# Patient Record
Sex: Male | Born: 1940 | Race: Black or African American | Hispanic: No | Marital: Married | Smoking: Former smoker
Health system: Southern US, Community
[De-identification: ages and names within clinical notes are randomized; demographics above are authoritative.]

## PROBLEM LIST (undated history)

## (undated) DIAGNOSIS — I10 Essential (primary) hypertension: Secondary | ICD-10-CM

## (undated) DIAGNOSIS — I4510 Unspecified right bundle-branch block: Secondary | ICD-10-CM

## (undated) DIAGNOSIS — G4730 Sleep apnea, unspecified: Secondary | ICD-10-CM

## (undated) DIAGNOSIS — F419 Anxiety disorder, unspecified: Secondary | ICD-10-CM

## (undated) DIAGNOSIS — E785 Hyperlipidemia, unspecified: Secondary | ICD-10-CM

## (undated) DIAGNOSIS — M810 Age-related osteoporosis without current pathological fracture: Secondary | ICD-10-CM

## (undated) DIAGNOSIS — K219 Gastro-esophageal reflux disease without esophagitis: Secondary | ICD-10-CM

## (undated) DIAGNOSIS — F32A Depression, unspecified: Secondary | ICD-10-CM

## (undated) DIAGNOSIS — F329 Major depressive disorder, single episode, unspecified: Secondary | ICD-10-CM

## (undated) DIAGNOSIS — K635 Polyp of colon: Secondary | ICD-10-CM

## (undated) DIAGNOSIS — M1990 Unspecified osteoarthritis, unspecified site: Secondary | ICD-10-CM

## (undated) DIAGNOSIS — D649 Anemia, unspecified: Secondary | ICD-10-CM

## (undated) DIAGNOSIS — T7840XA Allergy, unspecified, initial encounter: Secondary | ICD-10-CM

## (undated) HISTORY — DX: Allergy, unspecified, initial encounter: T78.40XA

## (undated) HISTORY — DX: Gastro-esophageal reflux disease without esophagitis: K21.9

## (undated) HISTORY — DX: Age-related osteoporosis without current pathological fracture: M81.0

## (undated) HISTORY — PX: ELBOW SURGERY: SHX618

## (undated) HISTORY — DX: Hyperlipidemia, unspecified: E78.5

## (undated) HISTORY — DX: Anxiety disorder, unspecified: F41.9

## (undated) HISTORY — DX: Unspecified right bundle-branch block: I45.10

## (undated) HISTORY — DX: Anemia, unspecified: D64.9

## (undated) HISTORY — DX: Essential (primary) hypertension: I10

## (undated) HISTORY — DX: Polyp of colon: K63.5

---

## 1998-12-22 ENCOUNTER — Ambulatory Visit (HOSPITAL_COMMUNITY): Admission: RE | Admit: 1998-12-22 | Discharge: 1998-12-22 | Payer: Self-pay | Admitting: Gastroenterology

## 1998-12-22 ENCOUNTER — Encounter (INDEPENDENT_AMBULATORY_CARE_PROVIDER_SITE_OTHER): Payer: Self-pay

## 1999-09-29 ENCOUNTER — Encounter: Payer: Self-pay | Admitting: Gastroenterology

## 1999-09-29 ENCOUNTER — Ambulatory Visit (HOSPITAL_COMMUNITY): Admission: RE | Admit: 1999-09-29 | Discharge: 1999-09-29 | Payer: Self-pay | Admitting: Gastroenterology

## 2000-06-20 ENCOUNTER — Encounter (INDEPENDENT_AMBULATORY_CARE_PROVIDER_SITE_OTHER): Payer: Self-pay

## 2000-06-20 ENCOUNTER — Ambulatory Visit (HOSPITAL_COMMUNITY): Admission: RE | Admit: 2000-06-20 | Discharge: 2000-06-20 | Payer: Self-pay | Admitting: Gastroenterology

## 2004-11-25 ENCOUNTER — Ambulatory Visit: Payer: Self-pay | Admitting: Family Medicine

## 2005-12-08 ENCOUNTER — Ambulatory Visit: Payer: Self-pay | Admitting: Family Medicine

## 2005-12-08 LAB — CONVERTED CEMR LAB
ALT: 31 units/L (ref 0–40)
AST: 28 units/L (ref 0–37)
Albumin: 3.5 g/dL (ref 3.5–5.2)
Alkaline Phosphatase: 63 units/L (ref 39–117)
BUN: 8 mg/dL (ref 6–23)
Basophils Absolute: 0 10*3/uL (ref 0.0–0.1)
Basophils Relative: 0.6 % (ref 0.0–1.0)
CO2: 28 meq/L (ref 19–32)
Calcium: 10.8 mg/dL — ABNORMAL HIGH (ref 8.4–10.5)
Chloride: 106 meq/L (ref 96–112)
Chol/HDL Ratio, serum: 2.6
Cholesterol: 164 mg/dL (ref 0–200)
Creatinine, Ser: 1 mg/dL (ref 0.4–1.5)
Eosinophil percent: 2.6 % (ref 0.0–5.0)
GFR calc non Af Amer: 80 mL/min
Glomerular Filtration Rate, Af Am: 96 mL/min/{1.73_m2}
Glucose, Bld: 105 mg/dL — ABNORMAL HIGH (ref 70–99)
HCT: 33.9 % — ABNORMAL LOW (ref 39.0–52.0)
HDL: 64.1 mg/dL (ref >39.0)
Hemoglobin: 11.3 g/dL — ABNORMAL LOW (ref 13.0–17.0)
Hgb A1c MFr Bld: 6.2 % — ABNORMAL HIGH (ref 4.6–6.0)
LDL Cholesterol: 84 mg/dL (ref 0–99)
Lymphocytes Relative: 36.7 % (ref 12.0–46.0)
MCHC: 33.2 g/dL (ref 30.0–36.0)
MCV: 86.8 fL (ref 78.0–100.0)
Monocytes Absolute: 0.5 10*3/uL (ref 0.2–0.7)
Monocytes Relative: 7.7 % (ref 3.0–11.0)
Neutro Abs: 3.7 10*3/uL (ref 1.4–7.7)
Neutrophils Relative %: 52.4 % (ref 43.0–77.0)
PSA: 0.32 ng/mL (ref 0.10–4.00)
Platelets: 379 10*3/uL (ref 150–400)
Potassium: 4.4 meq/L (ref 3.5–5.1)
RBC: 3.91 M/uL — ABNORMAL LOW (ref 4.22–5.81)
RDW: 14.7 % — ABNORMAL HIGH (ref 11.5–14.6)
Sodium: 139 meq/L (ref 135–145)
TSH: 0.68 microintl units/mL (ref 0.35–5.50)
Total Bilirubin: 0.6 mg/dL (ref 0.3–1.2)
Total Protein: 6.8 g/dL (ref 6.0–8.3)
Triglyceride fasting, serum: 81 mg/dL (ref 0–149)
VLDL: 16 mg/dL (ref 0–40)
WBC: 7 10*3/uL (ref 4.5–10.5)

## 2005-12-15 ENCOUNTER — Ambulatory Visit: Payer: Self-pay | Admitting: Family Medicine

## 2006-10-11 ENCOUNTER — Ambulatory Visit: Payer: Self-pay | Admitting: Family Medicine

## 2006-10-11 DIAGNOSIS — L03019 Cellulitis of unspecified finger: Secondary | ICD-10-CM

## 2006-12-16 ENCOUNTER — Encounter: Payer: Self-pay | Admitting: Family Medicine

## 2006-12-16 ENCOUNTER — Ambulatory Visit: Payer: Self-pay | Admitting: Family Medicine

## 2006-12-16 DIAGNOSIS — E785 Hyperlipidemia, unspecified: Secondary | ICD-10-CM

## 2006-12-16 DIAGNOSIS — Z860100 Personal history of colon polyps, unspecified: Secondary | ICD-10-CM | POA: Insufficient documentation

## 2006-12-16 DIAGNOSIS — N401 Enlarged prostate with lower urinary tract symptoms: Secondary | ICD-10-CM

## 2006-12-16 DIAGNOSIS — J309 Allergic rhinitis, unspecified: Secondary | ICD-10-CM | POA: Insufficient documentation

## 2006-12-16 DIAGNOSIS — I10 Essential (primary) hypertension: Secondary | ICD-10-CM

## 2006-12-16 DIAGNOSIS — N138 Other obstructive and reflux uropathy: Secondary | ICD-10-CM

## 2006-12-16 DIAGNOSIS — F528 Other sexual dysfunction not due to a substance or known physiological condition: Secondary | ICD-10-CM

## 2006-12-16 DIAGNOSIS — F411 Generalized anxiety disorder: Secondary | ICD-10-CM | POA: Insufficient documentation

## 2006-12-16 DIAGNOSIS — J45909 Unspecified asthma, uncomplicated: Secondary | ICD-10-CM | POA: Insufficient documentation

## 2006-12-16 DIAGNOSIS — K219 Gastro-esophageal reflux disease without esophagitis: Secondary | ICD-10-CM

## 2006-12-19 ENCOUNTER — Telehealth: Payer: Self-pay | Admitting: Family Medicine

## 2006-12-19 LAB — CONVERTED CEMR LAB
ALT: 39 units/L (ref 0–53)
AST: 38 units/L — ABNORMAL HIGH (ref 0–37)
Albumin: 3.6 g/dL (ref 3.5–5.2)
Alkaline Phosphatase: 67 units/L (ref 39–117)
BUN: 9 mg/dL (ref 6–23)
Basophils Absolute: 0.1 10*3/uL (ref 0.0–0.1)
Basophils Relative: 1 % (ref 0.0–1.0)
Bilirubin, Direct: 0.1 mg/dL (ref 0.0–0.3)
CO2: 30 meq/L (ref 19–32)
Calcium: 10.9 mg/dL — ABNORMAL HIGH (ref 8.4–10.5)
Chloride: 108 meq/L (ref 96–112)
Cholesterol: 159 mg/dL (ref 0–200)
Creatinine, Ser: 0.9 mg/dL (ref 0.4–1.5)
Eosinophils Absolute: 0.2 10*3/uL (ref 0.0–0.6)
Eosinophils Relative: 2.8 % (ref 0.0–5.0)
GFR calc Af Amer: 109 mL/min
GFR calc non Af Amer: 90 mL/min
Glucose, Bld: 113 mg/dL — ABNORMAL HIGH (ref 70–99)
HCT: 32.8 % — ABNORMAL LOW (ref 39.0–52.0)
HDL: 61.2 mg/dL (ref >39.0)
Hemoglobin: 10.8 g/dL — ABNORMAL LOW (ref 13.0–17.0)
LDL Cholesterol: 80 mg/dL (ref 0–99)
Lymphocytes Relative: 35.9 % (ref 12.0–46.0)
MCHC: 33 g/dL (ref 30.0–36.0)
MCV: 80.8 fL (ref 78.0–100.0)
Monocytes Absolute: 0.5 10*3/uL (ref 0.2–0.7)
Monocytes Relative: 8.1 % (ref 3.0–11.0)
Neutro Abs: 3.2 10*3/uL (ref 1.4–7.7)
Neutrophils Relative %: 52.2 % (ref 43.0–77.0)
PSA: 0.5 ng/mL (ref 0.10–4.00)
Platelets: 379 10*3/uL (ref 150–400)
Potassium: 4.6 meq/L (ref 3.5–5.1)
RBC: 4.06 M/uL — ABNORMAL LOW (ref 4.22–5.81)
RDW: 16.2 % — ABNORMAL HIGH (ref 11.5–14.6)
Sodium: 142 meq/L (ref 135–145)
TSH: 0.5 microintl units/mL (ref 0.35–5.50)
Total Bilirubin: 0.6 mg/dL (ref 0.3–1.2)
Total CHOL/HDL Ratio: 2.6
Total Protein: 6.8 g/dL (ref 6.0–8.3)
Triglycerides: 91 mg/dL (ref 0–149)
VLDL: 18 mg/dL (ref 0–40)
WBC: 6.2 10*3/uL (ref 4.5–10.5)

## 2006-12-20 ENCOUNTER — Telehealth: Payer: Self-pay | Admitting: Family Medicine

## 2007-04-13 ENCOUNTER — Telehealth (INDEPENDENT_AMBULATORY_CARE_PROVIDER_SITE_OTHER): Payer: Self-pay | Admitting: *Deleted

## 2007-07-31 ENCOUNTER — Ambulatory Visit: Payer: Self-pay | Admitting: Family Medicine

## 2007-07-31 DIAGNOSIS — J019 Acute sinusitis, unspecified: Secondary | ICD-10-CM

## 2007-08-23 ENCOUNTER — Telehealth: Payer: Self-pay | Admitting: Family Medicine

## 2008-01-09 ENCOUNTER — Ambulatory Visit: Payer: Self-pay | Admitting: Family Medicine

## 2008-01-09 DIAGNOSIS — D509 Iron deficiency anemia, unspecified: Secondary | ICD-10-CM

## 2009-01-14 ENCOUNTER — Ambulatory Visit: Payer: Self-pay | Admitting: Family Medicine

## 2009-01-14 DIAGNOSIS — N410 Acute prostatitis: Secondary | ICD-10-CM | POA: Insufficient documentation

## 2009-02-24 ENCOUNTER — Encounter: Payer: Self-pay | Admitting: Family Medicine

## 2009-12-09 ENCOUNTER — Ambulatory Visit: Payer: Self-pay | Admitting: Family Medicine

## 2009-12-09 LAB — CONVERTED CEMR LAB
ALT: 28 units/L (ref 0–53)
AST: 29 units/L (ref 0–37)
Albumin: 3.6 g/dL (ref 3.5–5.2)
Alkaline Phosphatase: 57 units/L (ref 39–117)
BUN: 12 mg/dL (ref 6–23)
Basophils Absolute: 0 10*3/uL (ref 0.0–0.1)
Basophils Relative: 0.5 % (ref 0.0–3.0)
Bilirubin Urine: NEGATIVE
Bilirubin, Direct: 0.1 mg/dL (ref 0.0–0.3)
Blood in Urine, dipstick: NEGATIVE
CO2: 29 meq/L (ref 19–32)
Calcium: 11.1 mg/dL — ABNORMAL HIGH (ref 8.4–10.5)
Chloride: 106 meq/L (ref 96–112)
Cholesterol: 167 mg/dL (ref 0–200)
Creatinine, Ser: 1.2 mg/dL (ref 0.4–1.5)
Eosinophils Absolute: 0.2 10*3/uL (ref 0.0–0.7)
Eosinophils Relative: 2.8 % (ref 0.0–5.0)
GFR calc non Af Amer: 79.48 mL/min (ref >60)
Glucose, Bld: 104 mg/dL — ABNORMAL HIGH (ref 70–99)
Glucose, Urine, Semiquant: NEGATIVE
HCT: 39 % (ref 39.0–52.0)
HDL: 63.2 mg/dL (ref >39.00)
Hemoglobin: 13 g/dL (ref 13.0–17.0)
Ketones, urine, test strip: NEGATIVE
LDL Cholesterol: 82 mg/dL (ref 0–99)
Lymphocytes Relative: 33.7 % (ref 12.0–46.0)
Lymphs Abs: 2.3 10*3/uL (ref 0.7–4.0)
MCHC: 33.4 g/dL (ref 30.0–36.0)
MCV: 92.7 fL (ref 78.0–100.0)
Monocytes Absolute: 0.5 10*3/uL (ref 0.1–1.0)
Monocytes Relative: 8.1 % (ref 3.0–12.0)
Neutro Abs: 3.7 10*3/uL (ref 1.4–7.7)
Neutrophils Relative %: 54.9 % (ref 43.0–77.0)
Nitrite: NEGATIVE
PSA: 0.67 ng/mL (ref 0.10–4.00)
Platelets: 326 10*3/uL (ref 150.0–400.0)
Potassium: 5 meq/L (ref 3.5–5.1)
Protein, U semiquant: NEGATIVE
RBC: 4.21 M/uL — ABNORMAL LOW (ref 4.22–5.81)
RDW: 14.9 % — ABNORMAL HIGH (ref 11.5–14.6)
Sodium: 139 meq/L (ref 135–145)
Specific Gravity, Urine: 1.02
TSH: 1.02 microintl units/mL (ref 0.35–5.50)
Total Bilirubin: 0.7 mg/dL (ref 0.3–1.2)
Total CHOL/HDL Ratio: 3
Total Protein: 6.7 g/dL (ref 6.0–8.3)
Triglycerides: 107 mg/dL (ref 0.0–149.0)
Urobilinogen, UA: 0.2
VLDL: 21.4 mg/dL (ref 0.0–40.0)
WBC: 6.8 10*3/uL (ref 4.5–10.5)
pH: 6.5

## 2009-12-16 ENCOUNTER — Ambulatory Visit: Payer: Self-pay | Admitting: Family Medicine

## 2009-12-16 ENCOUNTER — Encounter: Payer: Self-pay | Admitting: Family Medicine

## 2009-12-16 DIAGNOSIS — M722 Plantar fascial fibromatosis: Secondary | ICD-10-CM | POA: Insufficient documentation

## 2009-12-25 ENCOUNTER — Ambulatory Visit: Payer: Self-pay | Admitting: Endocrinology

## 2009-12-25 DIAGNOSIS — E210 Primary hyperparathyroidism: Secondary | ICD-10-CM

## 2009-12-25 DIAGNOSIS — M25569 Pain in unspecified knee: Secondary | ICD-10-CM

## 2009-12-25 LAB — CONVERTED CEMR LAB
Calcium, Total (PTH): 11.3 mg/dL — ABNORMAL HIGH (ref 8.4–10.5)
PTH: 228 pg/mL — ABNORMAL HIGH (ref 14.0–72.0)
Sed Rate: 18 mm/hr (ref 0–22)
Uric Acid, Serum: 7.2 mg/dL (ref 4.0–7.8)

## 2010-01-01 ENCOUNTER — Telehealth: Payer: Self-pay | Admitting: Endocrinology

## 2010-01-15 DIAGNOSIS — M810 Age-related osteoporosis without current pathological fracture: Secondary | ICD-10-CM

## 2010-01-15 HISTORY — DX: Age-related osteoporosis without current pathological fracture: M81.0

## 2010-01-21 ENCOUNTER — Ambulatory Visit: Payer: Self-pay | Admitting: Internal Medicine

## 2010-01-21 ENCOUNTER — Encounter: Payer: Self-pay | Admitting: Endocrinology

## 2010-03-04 ENCOUNTER — Telehealth: Payer: Self-pay | Admitting: Endocrinology

## 2010-03-15 LAB — CONVERTED CEMR LAB
ALT: 34 units/L (ref 0–53)
ALT: 37 units/L (ref 0–53)
AST: 34 units/L (ref 0–37)
AST: 35 units/L (ref 0–37)
Albumin: 3.4 g/dL — ABNORMAL LOW (ref 3.5–5.2)
Albumin: 3.9 g/dL (ref 3.5–5.2)
Alkaline Phosphatase: 61 units/L (ref 39–117)
Alkaline Phosphatase: 67 units/L (ref 39–117)
BUN: 13 mg/dL (ref 6–23)
BUN: 8 mg/dL (ref 6–23)
Basophils Absolute: 0 10*3/uL (ref 0.0–0.1)
Basophils Absolute: 0.1 10*3/uL (ref 0.0–0.1)
Basophils Relative: 0.7 % (ref 0.0–3.0)
Basophils Relative: 1 % (ref 0.0–3.0)
Bilirubin Urine: NEGATIVE
Bilirubin, Direct: 0.1 mg/dL (ref 0.0–0.3)
Bilirubin, Direct: 0.1 mg/dL (ref 0.0–0.3)
CO2: 29 meq/L (ref 19–32)
CO2: 31 meq/L (ref 19–32)
Calcium: 10.8 mg/dL — ABNORMAL HIGH (ref 8.4–10.5)
Calcium: 11 mg/dL — ABNORMAL HIGH (ref 8.4–10.5)
Chloride: 107 meq/L (ref 96–112)
Chloride: 108 meq/L (ref 96–112)
Cholesterol: 168 mg/dL (ref 0–200)
Cholesterol: 188 mg/dL (ref 0–200)
Creatinine, Ser: 1 mg/dL (ref 0.4–1.5)
Creatinine, Ser: 1 mg/dL (ref 0.4–1.5)
Eosinophils Absolute: 0.1 10*3/uL (ref 0.0–0.7)
Eosinophils Absolute: 0.2 10*3/uL (ref 0.0–0.7)
Eosinophils Relative: 2.2 % (ref 0.0–5.0)
Eosinophils Relative: 2.6 % (ref 0.0–5.0)
GFR calc Af Amer: 96 mL/min
GFR calc non Af Amer: 79 mL/min
GFR calc non Af Amer: 95.52 mL/min (ref >60)
Glucose, Bld: 116 mg/dL — ABNORMAL HIGH (ref 70–99)
Glucose, Bld: 99 mg/dL (ref 70–99)
Glucose, Urine, Semiquant: NEGATIVE
HCT: 37.1 % — ABNORMAL LOW (ref 39.0–52.0)
HCT: 39.5 % (ref 39.0–52.0)
HDL: 63.9 mg/dL (ref >39.0)
HDL: 70 mg/dL (ref >39.00)
Hemoglobin: 12.8 g/dL — ABNORMAL LOW (ref 13.0–17.0)
Hemoglobin: 13.1 g/dL (ref 13.0–17.0)
Ketones, urine, test strip: NEGATIVE
LDL Cholesterol: 87 mg/dL (ref 0–99)
LDL Cholesterol: 94 mg/dL (ref 0–99)
Lymphocytes Relative: 34.1 % (ref 12.0–46.0)
Lymphocytes Relative: 41.3 % (ref 12.0–46.0)
Lymphs Abs: 2.5 10*3/uL (ref 0.7–4.0)
MCHC: 33.1 g/dL (ref 30.0–36.0)
MCHC: 34.5 g/dL (ref 30.0–36.0)
MCV: 92.5 fL (ref 78.0–100.0)
MCV: 94.6 fL (ref 78.0–100.0)
Monocytes Absolute: 0.4 10*3/uL (ref 0.1–1.0)
Monocytes Absolute: 0.4 10*3/uL (ref 0.1–1.0)
Monocytes Relative: 5.9 % (ref 3.0–12.0)
Monocytes Relative: 8 % (ref 3.0–12.0)
Neutro Abs: 2.6 10*3/uL (ref 1.4–7.7)
Neutro Abs: 4 10*3/uL (ref 1.4–7.7)
Neutrophils Relative %: 47.4 % (ref 43.0–77.0)
Neutrophils Relative %: 56.8 % (ref 43.0–77.0)
Nitrite: NEGATIVE
PSA: 0.49 ng/mL (ref 0.10–4.00)
PSA: 0.81 ng/mL (ref 0.10–4.00)
Platelets: 288 10*3/uL (ref 150–400)
Platelets: 341 10*3/uL (ref 150.0–400.0)
Potassium: 4.5 meq/L (ref 3.5–5.1)
Potassium: 4.6 meq/L (ref 3.5–5.1)
Protein, U semiquant: NEGATIVE
RBC: 4.01 M/uL — ABNORMAL LOW (ref 4.22–5.81)
RBC: 4.17 M/uL — ABNORMAL LOW (ref 4.22–5.81)
RDW: 13.3 % (ref 11.5–14.6)
RDW: 13.4 % (ref 11.5–14.6)
Sodium: 141 meq/L (ref 135–145)
Sodium: 142 meq/L (ref 135–145)
Specific Gravity, Urine: 1.01
TSH: 0.81 microintl units/mL (ref 0.35–5.50)
TSH: 0.83 microintl units/mL (ref 0.35–5.50)
Total Bilirubin: 0.7 mg/dL (ref 0.3–1.2)
Total Bilirubin: 0.9 mg/dL (ref 0.3–1.2)
Total CHOL/HDL Ratio: 2.6
Total CHOL/HDL Ratio: 3
Total Protein: 6.9 g/dL (ref 6.0–8.3)
Total Protein: 7.4 g/dL (ref 6.0–8.3)
Triglycerides: 119 mg/dL (ref 0.0–149.0)
Triglycerides: 85 mg/dL (ref 0–149)
Urobilinogen, UA: 0.2
VLDL: 17 mg/dL (ref 0–40)
VLDL: 23.8 mg/dL (ref 0.0–40.0)
WBC: 5.3 10*3/uL (ref 4.5–10.5)
WBC: 7.2 10*3/uL (ref 4.5–10.5)
pH: 6.5

## 2010-03-19 NOTE — Assessment & Plan Note (Signed)
Summary: NEW ENDO CONSULT-ELEVATE CALCIUM-[facility]--[facility]   Vital Signs:  Patient profile:   70 year old male Height:      71 inches (180.34 cm) Weight:      250.13 pounds (113.70 kg) O2 Sat:      96 % on Room air Temp:     98.1 degrees F (36.72 degrees C) oral Pulse rate:   70 / minute Pulse rhythm:   regular BP sitting:   138 / 76  (left arm) Cuff size:   large  Vitals Entered By: Brenton Grills CMA Duncan Dull) (December 25, 2009 8:24 AM)  O2 Flow:  Room air CC: New Endo/Elevated Calcium/Dr. Fry/ Is Patient Diabetic? No   CC:  New Endo/Elevated Calcium/Dr. Fry/.  History of Present Illness: pt has been noted to have hypercalcemia.  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  no h/o urolithiasis, or pud.  he takes vitamin-d and ca++ only as part of a multivitamin. pt states few mos of moderate pain at the right heel, but no assoc numbness.  he recently had a steriod injection there.  Current Medications (verified): 1)  Metoprolol Tartrate 50 Mg Tabs (Metoprolol Tartrate) .... Two Times A Day 2)  Omeprazole 20 Mg  Tbec (Omeprazole) .... Once Daily 3)  Zocor 20 Mg  Tabs (Simvastatin) .... Once Daily 4)  Allergy Injection .... Weekly 5)  Anacin 81 Mg  Tbec (Aspirin) .... Once Daily 6)  Zyrtec Allergy 10 Mg  Tabs (Cetirizine Hcl) .... As Needed 7)  Fish Oil   Oil (Fish Oil) .... 2-3x Week 8)  Cialis 20 Mg  Tabs (Tadalafil) .... As Needed 9)  Ferrous Sulfate 325 (65 Fe) Mg  Tabs (Ferrous Sulfate) .... Once Daily 10)  Lotrel 10-20 Mg Caps (Amlodipine Besy-Benazepril Hcl) .... Once Daily  Allergies (verified): No Known Drug Allergies  Past History:  Past Medical History: Last updated: 01/09/2008 Allergic rhinitis (gets shots per Dr. Corinda Gubler) Anxiety Asthma Colonic polyps, hx of (sees Dr. Kinnie Cohen) GERD Hyperlipidemia Hypertension stable RBBB (had normal stress cardiolite on 11-15-01) Anemia-iron deficiency  Family History: Reviewed history from 12/16/2006 and no changes  required. unremarkable.  Social History: Reviewed history from 12/16/2006 and no changes required. 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  Review of Systems       denies weight loss, gynecomastia, hematuria, memory loss, urinary hesitancy, muscle weakness, hypoglycemia, skin rash, visual loss, sob, diarrhea, depression, and easy bruising.  he has nasal congestion, a few arthralgias (worst at the knees), and intermittent abdominal pain.  prilosec helps gerd sxs.  he reports increased urination.  Physical Exam  General:  normal appearance.   Head:  head: no deformity eyes: no periorbital swelling, no proptosis external nose and ears are normal mouth: no lesion seen Neck:  no nodule or goiter Chest Wall:  no apinal deformity Lungs:  Clear to auscultation bilaterally. Normal respiratory effort.  Heart:  Regular rate and rhythm without murmurs or gallops noted. Normal S1,S2.   Abdomen:  abdomen is soft, nontender.  no hepatosplenomegaly.   not distended.  no hernia  Msk:  muscle bulk and strength are grossly normal.  no obvious joint swelling.  gait is normal and steady  Pulses:  dorsalis pedis intact bilat.  no carotid bruit  Extremities:  no deformity.  no ulcer on the feet.  feet are of normal color and temp.  trace right pedal edema and 1+ left pedal edema. left knee is normal, except for slight swelling.  Neurologic:  cn 2-12 grossly intact.   readily moves all 4's.   sensation is intact to touch on the feet  Skin:  normal texture and temp.  no rash.  not diaphoretic  Cervical Nodes:  No significant adenopathy.  Psych:  Alert and cooperative; normal mood and affect; normal attention span and concentration.   Additional Exam:  Parathyroid Hormone  [H]  228.0 pg/mL                 14.0-72.0 Calcium              [H]  11.3 mg/dL     Impression & Recommendations:  Problem # 1:  PRIMARY HYPERPARATHYROIDISM (ICD-252.01) Assessment New we  reviewed the indication for surgery.  he has none now.  Problem # 2:  edema mild due to bp meds, but i agree with the avoidance of hctz.  lasix could be used if nesessary  Problem # 3:  heel pain not related to #1  Other Orders: T-Parathyroid Hormone, Intact w/ Calcium (16109-60454) TLB-Uric Acid, Blood (84550-URIC) TLB-Sedimentation Rate (ESR) (85652-ESR) T-Bone Densitometry (09811) Consultation Level IV (91478)  Patient Instructions: 1)  blood tests are being ordered for you today.  please call [phone] to hear your test results. 2)  based on the results, i would probably advise a bone-density test, and to return here in 6 months. 3)  (update: i left message on phone-tree:  bmd is ordered.  if no or mild osteoporosis, ret 6 mos).   Orders Added: 1)  T-Parathyroid Hormone, Intact w/ Calcium [08657-84696] 2)  TLB-Uric Acid, Blood [84550-URIC] 3)  TLB-Sedimentation Rate (ESR) [85652-ESR] 4)  T-Bone Densitometry [77080] 5)  Consultation Level IV [29528]   Immunization History:  Pneumovax Immunization History:    Pneumovax:  historical (02/16/2008)   Immunization History:  Pneumovax Immunization History:    Pneumovax:  Historical (02/16/2008)

## 2010-03-19 NOTE — Assessment & Plan Note (Signed)
Summary: cpx//alp   Vital Signs:  Patient profile:   70 year old male Height:      71 inches Weight:      248 pounds BMI:     34.71 O2 Sat:      94 % Temp:     98.4 degrees F Pulse rate:   84 / minute BP sitting:   140 / 82  (left arm) Cuff size:   large  Vitals Entered By: Pura Spice, RN (December 16, 2009 10:06 AM) CC: cpx  Is Patient Diabetic? No   History of Present Illness: 70 yr old male for a cpx. He is doing well in general, but he admits to not eating very healthily and not getting much exercise. He also mentions pains in the bottoms of both feet which have been bothering him for almost a year. He has taken Advil and he wears OTC arch supports in his shoes every day.   Allergies (verified): No Known Drug Allergies  Past History:  Past Medical History: Reviewed history from 01/09/2008 and no changes required. Allergic rhinitis (gets shots per Dr. Corinda Gubler) Anxiety Asthma Colonic polyps, hx of (sees Dr. Kinnie Acebo) GERD Hyperlipidemia Hypertension stable RBBB (had normal stress cardiolite on 11-15-01) Anemia-iron deficiency  Past Surgical History: surgery to right elbow colonoscopy 02-24-09  per Dr. Kinnie Bloomfield, several benign polyps,  (recommended a 5 yr follow up)  Past History:  Care Management: Ophthalmology: Dr Nelle Don   Family History: Reviewed history from 12/16/2006 and no changes required. unremarkable  Social History: Reviewed history from 12/16/2006 and no changes required. Married Never Smoked Alcohol use-no  Review of Systems  The patient denies anorexia, fever, weight loss, vision loss, decreased hearing, hoarseness, chest pain, syncope, dyspnea on exertion, peripheral edema, prolonged cough, headaches, hemoptysis, abdominal pain, melena, hematochezia, severe indigestion/heartburn, hematuria, incontinence, genital sores, muscle weakness, suspicious skin lesions, transient blindness, difficulty walking, depression, unusual weight change,  abnormal bleeding, enlarged lymph nodes, angioedema, breast masses, and testicular masses.    Physical Exam  General:  overweight-appearing.   Head:  Normocephalic and atraumatic without obvious abnormalities. No apparent alopecia or balding. Eyes:  No corneal or conjunctival inflammation noted. EOMI. Perrla. Funduscopic exam benign, without hemorrhages, exudates or papilledema. Vision grossly normal. Ears:  External ear exam shows no significant lesions or deformities.  Otoscopic examination reveals clear canals, tympanic membranes are intact bilaterally without bulging, retraction, inflammation or discharge. Hearing is grossly normal bilaterally. Nose:  External nasal examination shows no deformity or inflammation. Nasal mucosa are pink and moist without lesions or exudates. Mouth:  Oral mucosa and oropharynx without lesions or exudates.  Teeth in good repair. Neck:  No deformities, masses, or tenderness noted. Chest Wall:  No deformities, masses, tenderness or gynecomastia noted. Lungs:  Normal respiratory effort, chest expands symmetrically. Lungs are clear to auscultation, no crackles or wheezes. Heart:  Normal rate and regular rhythm. S1 and S2 normal without gallop, murmur, click, rub or other extra sounds. EKG normal except for chronic RBBB Abdomen:  Bowel sounds positive,abdomen soft and non-tender without masses, organomegaly or hernias noted. Rectal:  No external abnormalities noted. Normal sphincter tone. No rectal masses or tenderness. Genitalia:  Testes bilaterally descended without nodularity, tenderness or masses. No scrotal masses or lesions. No penis lesions or urethral discharge. Prostate:  Prostate gland firm and smooth, no enlargement, nodularity, tenderness, mass, asymmetry or induration. Msk:  No deformity or scoliosis noted of thoracic or lumbar spine.   Pulses:  R and L carotid,radial,femoral,dorsalis pedis  and posterior tibial pulses are full and equal  bilaterally Extremities:  No clubbing, cyanosis, edema, or deformity noted with normal full range of motion of all joints.  He is tender at the bottom of both heels  Neurologic:  No cranial nerve deficits noted. Station and gait are normal. Plantar reflexes are down-going bilaterally. DTRs are symmetrical throughout. Sensory, motor and coordinative functions appear intact. Skin:  Intact without suspicious lesions or rashes Cervical Nodes:  No lymphadenopathy noted Axillary Nodes:  No palpable lymphadenopathy Inguinal Nodes:  No significant adenopathy Psych:  Cognition and judgment appear intact. Alert and cooperative with normal attention span and concentration. 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 Two times a day 2)  Omeprazole 20 Mg Tbec (Omeprazole) .... Once daily 3)  Zocor 20 Mg Tabs (Simvastatin) .... Once daily 4)  Allergy Injection  .... Weekly 5)  Anacin 81 Mg Tbec (Aspirin) .... Once daily 6)  Zyrtec Allergy 10 Mg Tabs (Cetirizine hcl) .... As needed 7)  Fish Oil Oil (Fish oil) .... 2-3x week 8)  Cialis 20 Mg Tabs (Tadalafil) .... As needed 9)  Ferrous Sulfate 325 (65 Fe) Mg Tabs (Ferrous sulfate) .... Once daily 10)  Lotrel 10-20 Mg Caps (Amlodipine besy-benazepril hcl) .... Once daily  Patient Instructions: 1)  Please schedule a follow-up appointment in 6 months .  2)  It is important that you exercise reguarly at least 20 minutes 5 times a week. If you develop chest pain, have severe difficulty breathing, or feel very tired, stop exercising immediately and seek medical attention.  3)  You need to lose weight. Consider a lower calorie diet and regular exercise.  4)  Will  refer to Podiatry for the fasciitis Prescriptions: FERROUS SULFATE 325 (65 FE) MG  TABS (FERROUS SULFATE) once daily  #30 x 11   Entered and Authorized by:   Nelwyn Salisbury MD   Signed by:   Nelwyn Salisbury MD on 12/16/2009   Method used:   Electronically to        [facility] [address] [id number]       [address]       [place], [place]  [address]       Ph: [phone] or [phone]       Fax: [phone]   RxID:   [id number]    Orders Added: 1)  Est. Patient 65& > [36644] 2)  EKG w/ Interpretation [93000] 3)  Podiatry Referral [Podiatry]     Eye Exam  Dr Nelle Don last eye exam 2011

## 2010-03-19 NOTE — Progress Notes (Signed)
Summary: Results  Phone Note Call from Patient Call back at Home Phone [phone]   Caller: Patient [phone] Summary of Call: Pt called requesting results of bone density scan. Pt and I verified results are no longer available on PT system Initial call taken by: Margaret Pyle, CMA,  March 04, 2010 2:52 PM  Follow-up for Phone Call        you should consider "fosamax" medication, but this is not bad enough to require surgery. Follow-up by: Minus Breeding MD,  March 05, 2010 8:50 AM  Additional Follow-up for Phone Call Additional follow up Details #1::        left message on machine (home and call back) for pt to return my call  Additional Follow-up by: Margaret Pyle, CMA,  March 05, 2010 10:07 AM    Additional Follow-up for Phone Call Additional follow up Details #2::    Pt agreed and is requesting [facility] [address] Follow-up by: Margaret Pyle, CMA,  March 05, 2010 3:29 PM  Additional Follow-up for Phone Call Additional follow up Details #3:: Details for Additional Follow-up Action Taken: sent Additional Follow-up by: Minus Breeding MD,  March 05, 2010 4:28 PM  New/Updated Medications: ALENDRONATE SODIUM 70 MG TABS (ALENDRONATE SODIUM) 1 tab q week Prescriptions: ALENDRONATE SODIUM 70 MG TABS (ALENDRONATE SODIUM) 1 tab q week  #4 x 5   Entered and Authorized by:   Minus Breeding MD   Signed by:   Minus Breeding MD on 03/05/2010   Method used:   [facility]ronically to        [facility] ([facility]       [address]       [place], [place]  [address]       Ph: [phone] or [phone]       Fax: [phone]   RxID:   [id number]

## 2010-03-19 NOTE — Procedures (Signed)
Summary: Colonoscopy with Snare polypectomy/Dr. Sharrell Ku  Colonoscopy with Snare polypectomy/Dr. Sharrell Ku   Imported By: Maryln Gottron 03/18/2009 14:43:15  _____________________________________________________________________  External Attachment:    Type:   Image     Comment:   External Document

## 2010-03-19 NOTE — Progress Notes (Signed)
  Phone Note Call from Patient   Caller: Patient [phone] Summary of Call: Pt called stating that he was not advised of results of additional test per PT message. I see that BMP was to be ordered but there are no results, please advise? Initial call taken by: Margaret Pyle, CMA,  January 01, 2010 10:54 AM  Follow-up for Phone Call        bmd (bone density) was ordered, not bnp Follow-up by: Minus Breeding MD,  January 01, 2010 12:36 PM  Additional Follow-up for Phone Call Additional follow up Details #1::        Pt advised that once Bone Density results are recived MD willknow how to proceed with treatment if any. Pt understood. Additional Follow-up by: Margaret Pyle, CMA,  January 02, 2010 10:14 AM

## 2010-03-19 NOTE — Miscellaneous (Signed)
Summary: BONE DENSITY  Clinical Lists Changes  Orders: Added new Test order of T-Lumbar Vertebral Assessment (77082) - Signed 

## 2010-04-14 ENCOUNTER — Telehealth: Payer: Self-pay | Admitting: *Deleted

## 2010-04-14 NOTE — Telephone Encounter (Signed)
Scheduled appt for anxiety.

## 2010-04-14 NOTE — Telephone Encounter (Signed)
noted 

## 2010-04-15 ENCOUNTER — Ambulatory Visit (INDEPENDENT_AMBULATORY_CARE_PROVIDER_SITE_OTHER): Payer: 59 | Admitting: Family Medicine

## 2010-04-15 ENCOUNTER — Encounter: Payer: Self-pay | Admitting: Family Medicine

## 2010-04-15 VITALS — BP 112/82 | HR 84 | Temp 98.0°F | Wt 242.0 lb

## 2010-04-15 DIAGNOSIS — F411 Generalized anxiety disorder: Secondary | ICD-10-CM

## 2010-04-15 DIAGNOSIS — F419 Anxiety disorder, unspecified: Secondary | ICD-10-CM

## 2010-04-15 MED ORDER — LORAZEPAM 0.5 MG PO TABS: 0.5000 mg | ORAL_TABLET | Freq: Four times a day (QID) | ORAL | Status: AC | PRN

## 2010-04-16 ENCOUNTER — Encounter: Payer: Self-pay | Admitting: Family Medicine

## 2010-04-16 NOTE — Progress Notes (Signed)
  Subjective:    Patient ID: [name] [name], male    DOB: 1941-01-18, 70 y.o.   MRN: [id number]  HPI Here to discuss anxiety. 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 He feels anxious at times and can't relax. He has trouble sleeping at times. He denies much in the way of depression.     Review of Systems  Constitutional: Negative.   Psychiatric/Behavioral: Positive for sleep disturbance and decreased concentration. Negative for suicidal ideas, hallucinations, behavioral problems, confusion, dysphoric mood and agitation. The patient is nervous/anxious. The patient is not hyperactive.        Objective:   Physical Exam  Constitutional: He appears well-developed and well-nourished.  Psychiatric: He has a normal mood and affect. His behavior is normal. Judgment and thought content normal.          Assessment & Plan:  This is some simple anxiety, and he should do well with an as needed medication.

## 2010-04-22 ENCOUNTER — Other Ambulatory Visit: Payer: Self-pay | Admitting: Family Medicine

## 2010-07-03 NOTE — Assessment & Plan Note (Signed)
[facility]                              [facility] OFFICE NOTE   NAME:[name], [name] HALE                      MRN:          [id number]  DATE:12/15/2005                            DOB:          01-28-1941    This is a 70 year old gentleman here for a complete physical examination.  In general, he is doing well, but does ask for some help with some  allergies.  He continues to have chronic stuffiness and a stuffiness nose.  He has been using Astelin nasal spray for a couple of years, but stopped  taking them because they caused nosebleeds.  He would like to try something  else.  Of note, lately he has been seeing Dr. Charlsie Merles for sounds like plantar  fasciitis in the bottom of his left foot.  He is wearing some new molded  arch supports in his shoes lately.  Otherwise, we have been following him  for hyperlipidemia and hypertension, which has been well controlled.  He  would like to switch to generic medications if possible.   Further details of his past medical history, family history, social history,  habits, etc., refer to his last physical note dated November 25, 2004.   ALLERGIES:  NONE.   CURRENT MEDICATIONS:  1. Toprol XL 50 mg per day.  2. Lotrel 5/20 once a day.  3. Lipitor 10 mg per day.  4. Allergy shots weekly per Dr. Adolph Pollack.  5. Nexium 40 mg daily.  6. Cialis 20 mg as needed.   OBJECTIVE:  VITAL SIGNS:  Height 5 foot 11 inches.  Weight 240.  BP 152/88.  Pulse 80 and regular.  GENERAL:  He remains quite overweight.  SKIN:  Clear.  HEENT:  Eyes clear.  NECK:  Without lymphadenopathy or masses.  LUNGS:  Clear.  CARDIAC:  Regular rate and rhythm without gallops, murmurs or rubs.  Distal  pulses are full.  EKG shows right bundle branch block, but no changes from his baseline.  ABDOMEN:  Soft.  Normal bowel sounds.  Non-tender.  No masses.  GENITALIA:  Normal male.  RECTAL EXAM:  No mass or tenderness.  Prostate is within normal  limits.  Stool Hemoccult negative.  EXTREMITIES:  No clubbing, cyanosis or edema.  NEUROLOGIC EXAM:  Grossly intact.   He was here for fasting labs on December 08, 2005.  This was excellent with  an HDL of 64 and an LDL of 84.  Otherwise, was within normal limits.   ASSESSMENT AND PLAN:  1. Complete physical exam.  We talked about increasing exercise and losing      some weight.  2. Hypertension.  I think if he lost a little weight, this would normalize      nicely.  3. We will continue on current medications and ask him to come back in for      a recheck in 3 months.  4. Hyperlipidemia.  Well controlled.  However, we will switch to Zocor 20      mg once a day to save some money.  5. Gastroesophageal reflux disease, stable.  6. Allergies.  Gave him some samples to try Viramist nasal spray once a      day.  7. He was given a flu shot.    ______________________________  Tera Mater. Clent Ridges, MD    SAF/MedQ  DD: 12/16/2005  DT: 12/16/2005  Job #: [id number]

## 2010-08-11 ENCOUNTER — Telehealth: Payer: Self-pay | Admitting: Family Medicine

## 2010-08-11 DIAGNOSIS — N529 Male erectile dysfunction, unspecified: Secondary | ICD-10-CM

## 2010-08-11 NOTE — Telephone Encounter (Signed)
I do not prescribe these. He needs to have Urology do this. If he needs a referral we can set this up

## 2010-08-11 NOTE — Telephone Encounter (Signed)
Needs new rx for Erec-tech send to [facility] mail order pharmacy. Phone #----[phone] [address]) or fax rx to [phone].

## 2010-08-12 NOTE — Telephone Encounter (Signed)
Please refer patient to an urologist. Thanks.

## 2010-08-13 NOTE — Telephone Encounter (Signed)
I have refered him to Urology

## 2010-09-08 ENCOUNTER — Other Ambulatory Visit: Payer: Self-pay | Admitting: Endocrinology

## 2010-12-14 ENCOUNTER — Other Ambulatory Visit (INDEPENDENT_AMBULATORY_CARE_PROVIDER_SITE_OTHER): Payer: 59

## 2010-12-14 DIAGNOSIS — Z0000 Encounter for general adult medical examination without abnormal findings: Secondary | ICD-10-CM

## 2010-12-14 DIAGNOSIS — Z79899 Other long term (current) drug therapy: Secondary | ICD-10-CM

## 2010-12-14 LAB — LIPID PANEL
HDL: 61.8 mg/dL (ref >39.00)
Triglycerides: 67 mg/dL (ref 0.0–149.0)
VLDL: 13.4 mg/dL (ref 0.0–40.0)

## 2010-12-14 LAB — POCT URINALYSIS DIPSTICK
Bilirubin, UA: NEGATIVE
Blood, UA: NEGATIVE
Ketones, UA: NEGATIVE
Protein, UA: NEGATIVE
Spec Grav, UA: 1.02
pH, UA: 5.5

## 2010-12-14 LAB — HEPATIC FUNCTION PANEL
ALT: 27 U/L (ref 0–53)
AST: 25 U/L (ref 0–37)
Bilirubin, Direct: 0 mg/dL (ref 0.0–0.3)
Total Bilirubin: 0.3 mg/dL (ref 0.3–1.2)
Total Protein: 6.5 g/dL (ref 6.0–8.3)

## 2010-12-14 LAB — CBC WITH DIFFERENTIAL/PLATELET
Basophils Relative: 0.4 % (ref 0.0–3.0)
Eosinophils Relative: 4.5 % (ref 0.0–5.0)
HCT: 36.4 % — ABNORMAL LOW (ref 39.0–52.0)
Monocytes Relative: 6.4 % (ref 3.0–12.0)
Neutrophils Relative %: 53.3 % (ref 43.0–77.0)
Platelets: 354 10*3/uL (ref 150.0–400.0)
RBC: 3.93 Mil/uL — ABNORMAL LOW (ref 4.22–5.81)
WBC: 7.4 10*3/uL (ref 4.5–10.5)

## 2010-12-14 LAB — BASIC METABOLIC PANEL
BUN: 17 mg/dL (ref 6–23)
Chloride: 108 mEq/L (ref 96–112)
Creatinine, Ser: 1.2 mg/dL (ref 0.4–1.5)
GFR: 76.23 mL/min (ref >60.00)
Potassium: 5.5 mEq/L — ABNORMAL HIGH (ref 3.5–5.1)

## 2010-12-16 ENCOUNTER — Telehealth: Payer: Self-pay | Admitting: Family Medicine

## 2010-12-16 NOTE — Telephone Encounter (Signed)
Message copied by Baldemar Friday on Wed Dec 16, 2010  5:24 PM ------      Message from: Gershon Crane A      Created: Tue Dec 15, 2010  8:50 AM       Normal except his calcium level is the highest I have seen for him. Have him follow up with Dr. Everardo All ASAP

## 2010-12-16 NOTE — Telephone Encounter (Signed)
Left voice message with results and also asked for pt to call me back.

## 2010-12-17 ENCOUNTER — Telehealth: Payer: Self-pay | Admitting: Family Medicine

## 2010-12-17 NOTE — Telephone Encounter (Signed)
I spoke with pt and gave results.  

## 2010-12-17 NOTE — Telephone Encounter (Signed)
Message copied by Baldemar Friday on Thu Dec 17, 2010  1:43 PM ------      Message from: Gershon Crane A      Created: Tue Dec 15, 2010  8:50 AM       Normal except his calcium level is the highest I have seen for him. Have him follow up with Dr. Everardo All ASAP

## 2010-12-18 ENCOUNTER — Ambulatory Visit (INDEPENDENT_AMBULATORY_CARE_PROVIDER_SITE_OTHER): Payer: 59 | Admitting: Endocrinology

## 2010-12-18 ENCOUNTER — Encounter: Payer: Self-pay | Admitting: Endocrinology

## 2010-12-18 ENCOUNTER — Ambulatory Visit: Payer: 59 | Admitting: Endocrinology

## 2010-12-18 VITALS — BP 124/74 | HR 74 | Temp 98.3°F | Ht 71.0 in | Wt 231.4 lb

## 2010-12-18 DIAGNOSIS — E210 Primary hyperparathyroidism: Secondary | ICD-10-CM

## 2010-12-18 NOTE — Progress Notes (Signed)
  Subjective:    Patient ID: [name] [name], male    DOB: 01-Oct-1940, 70 y.o.   MRN: [id number]  HPI Pt returns to f/u primary hyperparathyroidism.  pt states he feels well in general, except for urinary frequency and fatigue.   Past Medical History  Diagnosis Date  . Allergy   . Anxiety   . Asthma   . GERD (gastroesophageal reflux disease)   . Hyperlipidemia   . Hypertension   . Anemia   . RBBB (right bundle branch block)     stable  . Colon polyp     Past Surgical History  Procedure Date  . Elbow surgery     right    History   Social History  . Marital Status: Married    Spouse Name: N/A    Number of Children: N/A  . Years of Education: N/A   Occupational History  . Not on file.   Social History Main Topics  . Smoking status: Never Smoker   . Smokeless tobacco: Not on file  . Alcohol Use: No  . Drug Use: Not on file  . Sexually Active: Not on file   Other Topics Concern  . Not on file   Social History Narrative  . No narrative on file    Current Outpatient Prescriptions on File Prior to Visit  Medication Sig Dispense Refill  . alendronate (FOSAMAX) 70 MG tablet TAKE 1 TABLET BY MOUTH EVERY WEEK  4 tablet  4  . amLODipine-benazepril (LOTREL) 10-20 MG per capsule Take 1 capsule by mouth daily.        . cetirizine (ZYRTEC) 10 MG tablet Take 10 mg by mouth daily.        . ferrous sulfate 325 (65 FE) MG tablet Take 325 mg by mouth daily with breakfast.        . fish oil-omega-3 fatty acids 1000 MG capsule Take 2 g by mouth daily.        . metoprolol (LOPRESSOR) 50 MG tablet TAKE 1 TABLET TWICE DAILY  60 tablet  8  . metoprolol (TOPROL-XL) 50 MG 24 hr tablet Take 50 mg by mouth daily.        Marland Kitchen omeprazole (PRILOSEC) 20 MG capsule Take 20 mg by mouth daily.        . simvastatin (ZOCOR) 20 MG tablet Take 20 mg by mouth at bedtime.         No Known Allergies  No family history on file.  BP 124/74  Pulse 74  Temp(Src) 98.3 F (36.8 C) (Oral)  Ht 5\' 11"   (1.803 m)  Wt 231 lb 6 oz (104.951 kg)  BMI 32.27 kg/m2  SpO2 98%  Review of Systems Denies weight change    Objective:   Physical Exam VITAL SIGNS:  See vs page GENERAL: no distress PSYCH: Alert and oriented x 3.  Does not appear anxious nor depressed.     Assessment & Plan:  Primary hyperparathyroidism.  His ca++ level is getting to the point where he needs surgery.

## 2010-12-18 NOTE — Patient Instructions (Signed)
blood tests are being requested for you today.  please call [phone] to hear your test results.  You will be prompted to enter the 9-digit "MRN" number that appears at the top left of this page, followed by #.  Then you will hear the message.

## 2010-12-21 ENCOUNTER — Encounter: Payer: Self-pay | Admitting: Family Medicine

## 2010-12-21 ENCOUNTER — Ambulatory Visit (INDEPENDENT_AMBULATORY_CARE_PROVIDER_SITE_OTHER): Payer: 59 | Admitting: Family Medicine

## 2010-12-21 VITALS — BP 134/80 | HR 123 | Temp 98.6°F | Ht 71.5 in | Wt 238.0 lb

## 2010-12-21 DIAGNOSIS — Z0000 Encounter for general adult medical examination without abnormal findings: Secondary | ICD-10-CM

## 2010-12-21 LAB — PTH, INTACT AND CALCIUM
Calcium, Total (PTH): 11.8 mg/dL — ABNORMAL HIGH (ref 8.4–10.5)
PTH: 204.1 pg/mL — ABNORMAL HIGH (ref 14.0–72.0)

## 2010-12-21 MED ORDER — SIMVASTATIN 20 MG PO TABS: 20.0000 mg | ORAL_TABLET | Freq: Every day | ORAL | Status: DC

## 2010-12-21 MED ORDER — FERROUS SULFATE 325 (65 FE) MG PO TABS: 325.0000 mg | ORAL_TABLET | Freq: Every day | ORAL | Status: DC

## 2010-12-21 MED ORDER — METOPROLOL TARTRATE 50 MG PO TABS: 50.0000 mg | ORAL_TABLET | Freq: Two times a day (BID) | ORAL | Status: DC

## 2010-12-21 MED ORDER — AMLODIPINE BESY-BENAZEPRIL HCL 10-20 MG PO CAPS: 1.0000 | ORAL_CAPSULE | Freq: Every day | ORAL | Status: DC

## 2010-12-21 MED ORDER — OMEPRAZOLE 20 MG PO CPDR: 20.0000 mg | DELAYED_RELEASE_CAPSULE | Freq: Every day | ORAL | Status: DC

## 2010-12-21 NOTE — Progress Notes (Signed)
  Subjective:    Patient ID: [name] [name], male    DOB: 1940/04/26, 70 y.o.   MRN: [id number]  HPI 70 yr old male for a cpx. He feels well in general. He recently saw Dr. Everardo All for his hyperparathyroidism, and they have some labs pending.    Review of Systems  Constitutional: Negative.   HENT: Negative.   Eyes: Negative.   Respiratory: Negative.   Cardiovascular: Negative.   Gastrointestinal: Negative.   Genitourinary: Negative.   Musculoskeletal: Negative.   Skin: Negative.   Neurological: Negative.   Hematological: Negative.   Psychiatric/Behavioral: Negative.        Objective:   Physical Exam  Constitutional: He is oriented to person, place, and time. He appears well-developed and well-nourished. No distress.  HENT:  Head: Normocephalic and atraumatic.  Right Ear: External ear normal.  Left Ear: External ear normal.  Nose: Nose normal.  Mouth/Throat: Oropharynx is clear and moist. No oropharyngeal exudate.  Eyes: Conjunctivae and EOM are normal. Pupils are equal, round, and reactive to light. Right eye exhibits no discharge. Left eye exhibits no discharge. No scleral icterus.  Neck: Neck supple. No JVD present. No tracheal deviation present. No thyromegaly present.  Cardiovascular: Normal rate, regular rhythm, normal heart sounds and intact distal pulses.  Exam reveals no gallop and no friction rub.   No murmur heard.      EKG normal with stable RBBB  Pulmonary/Chest: Effort normal and breath sounds normal. No respiratory distress. He has no wheezes. He has no rales. He exhibits no tenderness.  Abdominal: Soft. Bowel sounds are normal. He exhibits no distension and no mass. There is no tenderness. There is no rebound and no guarding.  Genitourinary: Rectum normal, prostate normal and penis normal. Guaiac negative stool. No penile tenderness.  Musculoskeletal: Normal range of motion. He exhibits no edema and no tenderness.  Lymphadenopathy:    He has no cervical  adenopathy.  Neurological: He is alert and oriented to person, place, and time. He has normal reflexes. No cranial nerve deficit. He exhibits normal muscle tone. Coordination normal.  Skin: Skin is warm and dry. No rash noted. He is not diaphoretic. No erythema. No pallor.  Psychiatric: He has a normal mood and affect. His behavior is normal. Judgment and thought content normal.          Assessment & Plan:  Get more exercise.

## 2010-12-22 NOTE — Progress Notes (Signed)
Addended by: Gershon Crane A on: 12/22/2010 07:20 AM   Modules accepted: Orders

## 2011-01-17 ENCOUNTER — Other Ambulatory Visit: Payer: Self-pay | Admitting: Family Medicine

## 2011-02-12 ENCOUNTER — Other Ambulatory Visit: Payer: Self-pay | Admitting: Endocrinology

## 2011-03-30 ENCOUNTER — Other Ambulatory Visit: Payer: Self-pay | Admitting: Family Medicine

## 2011-03-30 NOTE — Telephone Encounter (Signed)
How often should pt be taking Iron? ( qd or bid ) I see this in chart both ways.

## 2011-04-21 ENCOUNTER — Telehealth: Payer: Self-pay | Admitting: *Deleted

## 2011-04-21 DIAGNOSIS — E210 Primary hyperparathyroidism: Secondary | ICD-10-CM

## 2011-04-21 NOTE — Telephone Encounter (Signed)
Pt was seen in 12/2010 for hyperparathyroidism and is following up on results. Pt did not hear phone tree message regarding results and MD's advisement. A referral for surgery was recommended at the time. Pt wants to know if surgery is still an option and why.

## 2011-04-21 NOTE — Telephone Encounter (Signed)
i sent referral.  Surgery is the only effective treatment for this condition

## 2011-04-21 NOTE — Telephone Encounter (Signed)
Pt informed of MD's advisement and of referral.  

## 2011-05-04 ENCOUNTER — Encounter: Payer: Self-pay | Admitting: Family Medicine

## 2011-05-04 ENCOUNTER — Ambulatory Visit (INDEPENDENT_AMBULATORY_CARE_PROVIDER_SITE_OTHER): Payer: 59 | Admitting: Family Medicine

## 2011-05-04 VITALS — BP 128/76 | HR 93 | Temp 98.2°F | Wt 240.0 lb

## 2011-05-04 DIAGNOSIS — G4730 Sleep apnea, unspecified: Secondary | ICD-10-CM

## 2011-05-04 NOTE — Progress Notes (Signed)
  Subjective:    Patient ID: [name] [name], male    DOB: [birth year], 71 y.o.   MRN: [id number]  HPI Here to discuss possible sleep apnea. He has snored for many years, and he has been overweight for years. Lately his wife has become concerned that he seems to struggle for breath in his sleep, and his sleep is very restless. He admits that he is tired most of the time, and he tends to fall asleep during the day at inappropriate times (during meetings, while driving, etc). No cough or chest pains or SOB during the day.    Review of Systems  Constitutional: Positive for fatigue.  Respiratory: Negative.   Cardiovascular: Negative.        Objective:   Physical Exam  Constitutional: He is oriented to person, place, and time. He appears well-developed and well-nourished.  HENT:  Right Ear: External ear normal.  Left Ear: External ear normal.  Nose: Nose normal.  Mouth/Throat: Oropharynx is clear and moist. No oropharyngeal exudate.  Eyes: Conjunctivae are normal.  Neck: Neck supple. No tracheal deviation present. No thyromegaly present.  Cardiovascular: Normal rate, regular rhythm, normal heart sounds and intact distal pulses.   Pulmonary/Chest: Effort normal and breath sounds normal. No stridor. No respiratory distress. He has no wheezes. He has no rales.  Lymphadenopathy:    He has no cervical adenopathy.  Neurological: He is alert and oriented to person, place, and time.          Assessment & Plan:  Probable sleep apnea. The best long term solution is weight loss, and we discussed this. In the meantime we will refer for a possible sleep study.

## 2011-05-05 NOTE — Progress Notes (Signed)
Addended by: Gershon Crane A on: 05/05/2011 12:11 PM   Modules accepted: Orders

## 2011-05-06 ENCOUNTER — Ambulatory Visit (INDEPENDENT_AMBULATORY_CARE_PROVIDER_SITE_OTHER): Payer: Medicare Other | Admitting: General Surgery

## 2011-05-06 ENCOUNTER — Encounter (INDEPENDENT_AMBULATORY_CARE_PROVIDER_SITE_OTHER): Payer: Self-pay | Admitting: General Surgery

## 2011-05-06 VITALS — BP 143/80 | HR 95 | Temp 97.8°F | Ht 72.0 in | Wt 239.2 lb

## 2011-05-06 DIAGNOSIS — E349 Endocrine disorder, unspecified: Secondary | ICD-10-CM

## 2011-05-06 DIAGNOSIS — E210 Primary hyperparathyroidism: Secondary | ICD-10-CM

## 2011-05-06 NOTE — Progress Notes (Signed)
Patient ID: [name] [name], male   DOB: Jun 26, 1940, 72 y.o.   MRN: [id number]  Chief Complaint  Patient presents with  . Pre-op Exam    eval hyperparathyroid    HPI [name] [name] is a 71 y.o. male.   HPI 71 year old [place] American male referred by Dr. Everardo All for evaluation of primary hyperparathyroidism. The patient's calcium level has steadily been increasing over the past several years. He denies any muscle weakness or muscle cramps. He states that he has normal kidney function and has never had a kidney stone. He denies any depression. He states to his knowledge he has not been on any thiazide diuretic. He denies any use of lithium medication. He denies any family history of thyroid or parathyroid issues. The only family history of cancer is in his mother and she had stomach cancer. He states that he has been diagnosed with osteoporosis and is on medication for osteoporosis. Past Medical History  Diagnosis Date  . Allergy   . Anxiety   . Asthma   . GERD (gastroesophageal reflux disease)   . Hyperlipidemia   . Hypertension   . Anemia   . RBBB (right bundle branch block)     stable  . Colon polyp     Past Surgical History  Procedure Date  . Elbow surgery     right  . Colonoscopy 02-24-09    per Dr. Kinnie Buelna, polyps, repeat in 5 yrs    Family History  Problem Relation Age of Onset  . Cancer Mother     stomach    Social History History  Substance Use Topics  . Smoking status: Former Smoker    Types: Cigarettes  . Smokeless tobacco: Former Neurosurgeon    Quit date: 08/06/1979  . Alcohol Use: Yes     once or twice a month    No Known Allergies  Current Outpatient Prescriptions  Medication Sig Dispense Refill  . alendronate (FOSAMAX) 70 MG tablet TAKE 1 TABLET BY MOUTH EVERY WEEK  4 tablet  4  . amLODipine (NORVASC) 10 MG tablet TAKE 1 TABLET BY MOUTH DAILY  30 tablet  10  . aspirin 81 MG tablet Take 81 mg by mouth daily.      . benazepril (LOTENSIN) 20 MG tablet TAKE  1 TABLET BY MOUTH DAILY  30 tablet  10  . cetirizine (ZYRTEC) 10 MG tablet Take 10 mg by mouth as needed.       . [employer] IRON 325 (65 FE) MG tablet TAKE 1 TABLET BY MOUTH 2 TIMES A DAY  60 tablet  7  . fish oil-omega-3 fatty acids 1000 MG capsule Take 2 g by mouth daily.        . metoprolol (LOPRESSOR) 50 MG tablet Take 1 tablet (50 mg total) by mouth 2 (two) times daily.  60 tablet  11  . Multiple Vitamin (MULTIVITAMIN) tablet Take 1 tablet by mouth daily.      Marland Kitchen omeprazole (PRILOSEC) 20 MG capsule Take 1 capsule (20 mg total) by mouth daily.  30 capsule  11  . simvastatin (ZOCOR) 20 MG tablet Take 1 tablet (20 mg total) by mouth at bedtime.  30 tablet  11    Review of Systems Review of Systems  Constitutional: Negative for fever, chills, appetite change and unexpected weight change.  HENT: Positive for congestion. Negative for hearing loss, trouble swallowing and neck pain.   Eyes: Negative for visual disturbance.  Respiratory: Negative for chest tightness, shortness of breath  and wheezing.   Cardiovascular: Negative for chest pain and leg swelling.       No PND, no orthopnea, no DOE  Gastrointestinal: Negative for vomiting, abdominal pain, diarrhea and constipation.       See HPI  Genitourinary: Negative for dysuria and hematuria.       No kidney stones  Musculoskeletal: Negative.  Negative for myalgias and arthralgias.       On med for osteoporosis  Skin: Negative for rash.  Neurological: Negative for seizures and speech difficulty.  Hematological: Does not bruise/bleed easily.  Psychiatric/Behavioral: Negative for behavioral problems and confusion.       Denies depression    Blood pressure 143/80, pulse 95, temperature 97.8 F (36.6 C), temperature source Temporal, height 6' (1.829 m), weight 239 lb 3.2 oz (108.5 kg), SpO2 97.00%.  Physical Exam Physical Exam  Vitals reviewed. Constitutional: He is oriented to person, place, and time. He appears well-developed and  well-nourished. No distress.  HENT:  Head: Normocephalic and atraumatic.  Right Ear: External ear normal.  Left Ear: External ear normal.  Eyes: Conjunctivae are normal. No scleral icterus.  Neck: Normal range of motion. Neck supple. No tracheal deviation present. No mass and no thyromegaly present.  Cardiovascular: Normal rate, regular rhythm and normal heart sounds.   Pulmonary/Chest: Effort normal and breath sounds normal. No stridor. No respiratory distress. He has no wheezes.  Abdominal: Soft. Bowel sounds are normal. He exhibits no distension. There is no tenderness.  Musculoskeletal: Normal range of motion. He exhibits no edema and no tenderness.  Lymphadenopathy:       Head (right side): No submandibular, no preauricular, no posterior auricular and no occipital adenopathy present.       Head (left side): No submandibular, no preauricular, no posterior auricular and no occipital adenopathy present.       Right cervical: No deep cervical and no posterior cervical adenopathy present.      Left cervical: No deep cervical and no posterior cervical adenopathy present.       Right: No supraclavicular adenopathy present.       Left: No supraclavicular adenopathy present.  Neurological: He is alert and oriented to person, place, and time. He exhibits normal muscle tone.  Skin: Skin is warm and dry. No rash noted. He is not diaphoretic. No erythema.  Psychiatric: He has a normal mood and affect. His behavior is normal. Thought content normal.    Data Reviewed Dr George Hugh note iPTH 204 (12/2010); 228 (1 yr ago) Ca 11.8 (12/2010); 11.3  Assessment    Primary Hyperparathyroidism    Plan    We discussed the diagnosis of primary hyperparathyroidism. He is given education material as well as diagrams regarding hyperparathyroidism. In reviewing his labs, his calcium level has been steadily increasing over the past of years. Dr. Everardo All has referred him here to discuss parathyroid surgery.  There doesn't appear to be another cause for his hypercalcemia.  I explained that generally with hyperparathyroidism it is generally due to a single gland that is functioning abnormally and generally enlarged compared to the other parathyroid glands.  We did discuss medical versus surgical treatment. We also discussed the potential benefits of a parathyroidectomy  With respect to confirming the diagnosis and ruling out familial hypocalciuric hypercalcemia (which would be very unusual), I have recommended that we repeat a serum calcium level and an intact parathyroid hormone level as well as a 24-hour urine to measure calcium.  Moreover in order to help perform a minimally  invasive parathyroidectomy I've recommended that we try to localize the abnormal gland with a sestamibi scan. I have explained that sometimes the sestamibi scan will not yield or demonstrate the abnormal gland.  We will get these tests and bring him back to discuss the results.  Mary Sella. Andrey Campanile, MD, FACS General, Bariatric, & Minimally Invasive Surgery [facility], [place]        [facility] M 05/06/2011, 6:34 PM

## 2011-05-14 ENCOUNTER — Other Ambulatory Visit: Payer: Self-pay | Admitting: Family Medicine

## 2011-05-18 ENCOUNTER — Encounter (HOSPITAL_COMMUNITY): Payer: Self-pay

## 2011-05-19 ENCOUNTER — Telehealth: Payer: Self-pay | Admitting: *Deleted

## 2011-05-19 NOTE — Telephone Encounter (Signed)
Pt states he is returning call to office, not sure who called him.

## 2011-05-21 NOTE — Telephone Encounter (Signed)
Called pt to make aware Nuclear Medicine was attempting to contact to schedule a procedure.  Pt is aware.

## 2011-05-27 ENCOUNTER — Encounter: Payer: Self-pay | Admitting: Pulmonary Disease

## 2011-05-27 ENCOUNTER — Ambulatory Visit (INDEPENDENT_AMBULATORY_CARE_PROVIDER_SITE_OTHER): Payer: Medicare Other | Admitting: Pulmonary Disease

## 2011-05-27 VITALS — BP 142/68 | HR 95 | Temp 97.9°F | Ht 72.0 in | Wt 241.4 lb

## 2011-05-27 DIAGNOSIS — G4733 Obstructive sleep apnea (adult) (pediatric): Secondary | ICD-10-CM

## 2011-05-27 NOTE — Progress Notes (Signed)
  Subjective:    Patient ID: [name] [name], male    DOB: February 24, 1940, 71 y.o.   MRN: [id number]  HPI The patient is a 71 year old male who been asked to see for possible obstructive sleep apnea.  He has been noted by his wife to have mild snoring, as well as an abnormal breathing pattern during sleep.  The patient has frequent awakenings at night, and admits to occasional choking arousals.  He does not feel rested in the mornings upon arising.  The patient states that he has significant sleep pressure during the day with any period of inactivity.  He will also follow sleep very easily while watching TV in the evening, and can have sleep pressure with driving longer distances.  His Epworth sleepiness score today is abnormal at 18, and the patient states that his weight is actually down 5-10 pounds over the last 2 years.  Sleep Questionnaire: What time do you typically go to bed?( Between what hours) 11 to 12 How long does it take you to fall asleep? 15 to 20 mins How many times during the night do you wake up? 5 What time do you get out of bed to start your day? 0730 Do you drive or operate heavy machinery in your occupation? Yes How much has your weight changed (up or down) over the past two years? (In pounds) 5 lb (2.268 kg) Have you ever had a sleep study before? No Do you currently use CPAP? No Do you wear oxygen at any time? No    Review of Systems  Constitutional: Positive for unexpected weight change. Negative for fever.  HENT: Negative for ear pain, nosebleeds, congestion, sore throat, rhinorrhea, sneezing, trouble swallowing, dental problem, postnasal drip and sinus pressure.   Eyes: Negative for redness and itching.  Respiratory: Positive for cough and shortness of breath. Negative for chest tightness and wheezing.   Cardiovascular: Negative for palpitations and leg swelling.  Gastrointestinal: Negative for nausea and vomiting.  Genitourinary: Negative for dysuria.  Musculoskeletal:  Positive for joint swelling.  Skin: Negative for rash.  Neurological: Positive for headaches.  Hematological: Does not bruise/bleed easily.  Psychiatric/Behavioral: Negative for dysphoric mood. The patient is not nervous/anxious.        Objective:   Physical Exam Constitutional:  Obese male, no acute distress  HENT:  Nares patent without discharge  Oropharynx without exudate, palate and uvula are mildly elongated.   Eyes:  Perrla, eomi, no scleral icterus  Neck:  No JVD, no TMG  Cardiovascular:  Normal rate, regular rhythm, no rubs or gallops.  No murmurs        Intact distal pulses  Pulmonary :  Normal breath sounds, no stridor or respiratory distress   No rales, rhonchi, or wheezing  Abdominal:  Soft, nondistended, bowel sounds present.  No tenderness noted.   Musculoskeletal:  minimal lower extremity edema noted.  Lymph Nodes:  No cervical lymphadenopathy noted  Skin:  No cyanosis noted  Neurologic:  Appears sleepy, appropriate, moves all 4 extremities without obvious deficit.         Assessment & Plan:

## 2011-05-27 NOTE — Assessment & Plan Note (Signed)
The patient's history is very sick just above clinically significant sleep apnea.  He has loud snoring and an abnormal breathing pattern during sleep, frequent awakenings and nonrestorative sleep, and significant daytime sleepiness.  I have had long discussion with him about sleep apnea, including its impact to his quality of life and cardiovascular health.  The patient needs to have a sleep study done for diagnosis, and I have also encouraged him to work aggressively on weight loss.

## 2011-05-27 NOTE — Patient Instructions (Signed)
Will set up for a sleep study, and arrange for followup once the results are available.

## 2011-06-02 ENCOUNTER — Telehealth (INDEPENDENT_AMBULATORY_CARE_PROVIDER_SITE_OTHER): Payer: Self-pay | Admitting: General Surgery

## 2011-06-02 NOTE — Telephone Encounter (Signed)
Spoke with patient re: appt and parathyroid scan. Patient had no showed his radiology test. He still has appt scheduled for tomorrow. Made patient aware I was cancelling this appt and once patient has called [facility] scheduling to reschedule his parathyroid scan, he should call and make follow up with Dr Andrey Campanile. Number provided for [facility] scheduling so patient can schedule at a time convenient for him. He agrees with this plan and will call after complete.

## 2011-06-03 ENCOUNTER — Encounter (INDEPENDENT_AMBULATORY_CARE_PROVIDER_SITE_OTHER): Payer: Medicare Other | Admitting: General Surgery

## 2011-06-15 ENCOUNTER — Ambulatory Visit (HOSPITAL_BASED_OUTPATIENT_CLINIC_OR_DEPARTMENT_OTHER): Payer: Medicare Other | Attending: Pulmonary Disease | Admitting: Radiology

## 2011-06-15 VITALS — Ht 72.0 in | Wt 230.0 lb

## 2011-06-15 DIAGNOSIS — G4733 Obstructive sleep apnea (adult) (pediatric): Secondary | ICD-10-CM | POA: Insufficient documentation

## 2011-06-17 ENCOUNTER — Encounter (HOSPITAL_COMMUNITY)
Admission: RE | Admit: 2011-06-17 | Discharge: 2011-06-17 | Disposition: A | Discharge status: Home / Self Care | Payer: Medicare Other | Source: Ambulatory Visit | Attending: General Surgery | Admitting: General Surgery

## 2011-06-17 ENCOUNTER — Ambulatory Visit (HOSPITAL_COMMUNITY)
Admission: RE | Admit: 2011-06-17 | Discharge: 2011-06-17 | Disposition: A | Discharge status: Home / Self Care | Payer: Medicare Other | Source: Ambulatory Visit | Attending: General Surgery | Admitting: General Surgery

## 2011-06-17 DIAGNOSIS — R946 Abnormal results of thyroid function studies: Secondary | ICD-10-CM | POA: Insufficient documentation

## 2011-06-17 DIAGNOSIS — E349 Endocrine disorder, unspecified: Secondary | ICD-10-CM

## 2011-06-17 DIAGNOSIS — D34 Benign neoplasm of thyroid gland: Secondary | ICD-10-CM | POA: Insufficient documentation

## 2011-06-17 MED ORDER — TECHNETIUM TC 99M SESTAMIBI - CARDIOLITE
25.0000 | Freq: Once | INTRAVENOUS | Status: AC | PRN
  Administered 2011-06-17: 25 via INTRAVENOUS

## 2011-06-18 ENCOUNTER — Telehealth (INDEPENDENT_AMBULATORY_CARE_PROVIDER_SITE_OTHER): Payer: Self-pay | Admitting: General Surgery

## 2011-06-18 NOTE — Telephone Encounter (Signed)
Patient aware of results. Reminded him to get labs drawn prior to appt on 07/28/11. He will follow up at scheduled appt.

## 2011-06-18 NOTE — Telephone Encounter (Signed)
Message copied by Liliana Cline on Fri Jun 18, 2011  9:27 AM ------      Message from: [place], ERIC M      Created: Fri Jun 18, 2011  8:57 AM       pls call pt -Test suggests that pt has an enlarged parathyroid gland on left side. Need him to get labs as ordered. Will further discuss results at next appt and discuss surgery.

## 2011-07-03 DIAGNOSIS — G4733 Obstructive sleep apnea (adult) (pediatric): Secondary | ICD-10-CM

## 2011-07-03 NOTE — Procedures (Signed)
NAMESHIELDS, PAUTZ NO.:  [id number]  MEDICAL RECORD NO.:  [id number]          PATIENT TYPE:  OUT  LOCATION:  SLEEP CENTER                 FACILITY:  [facility]  PHYSICIAN:  Barbaraann Share, MD,FCCPDATE OF BIRTH:  1940-06-29  DATE OF STUDY:  06/15/2011                           NOCTURNAL POLYSOMNOGRAM  REFERRING PHYSICIAN:  Barbaraann Share, MD,FCCP  LOCATION:  Sleep Lab.  REFERRING PHYSICIAN:  Barbaraann Share, MD,FCCP  INDICATION FOR STUDY:  Hypersomnia with sleep apnea.  EPWORTH SLEEPINESS SCORE:  13.  MEDICATIONS:  SLEEP ARCHITECTURE:  The patient had a total sleep time of 212 minutes with no slow-wave sleep and only 19 minutes of REM.  Sleep onset latency was normal at 12 minutes and REM onset was prolonged at 150 minutes. Sleep efficiency was poor at 59%.  RESPIRATORY DATA:  The patient was found to have 1 apnea and 18 obstructive hypopneas, giving him an apnea-hypopnea index of 5.4 events per hour.  He was also noted to have 91 respiratory effort-related arousals, giving him an RDI of 32 events per hour.  The events were not positional, and there was loud snoring noted throughout.  OXYGEN DATA:  There was O2 desaturation as low as 91% with the patient's obstructive events.  CARDIAC DATA:  Rare PAC noted, but no clinically significant arrhythmias were seen.  MOVEMENT-PARASOMNIA:  The patient had no significant leg jerks or other abnormal behaviors noted.  IMPRESSIONS-RECOMMENDATIONS: 1. 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  I     suspect the patient's sleep apnea is being underestimated by this     study because of the short total sleep time and the decreased     quantity of REM.  Treatment for this degree of sleep apnea can     include a trial of weight loss alone, upper airway surgery, dental     appliance, and also a continuous positive     airway  pressure.  Clinical correlation is suggested. 2. Rare premature atrial contraction noted, but no clinically     significant arrhythmias were seen.     Barbaraann Share, MD,FCCP Diplomate, American Board of Sleep Medicine    [facility]/MED[facility]  D:  07/03/2011 16:39:23  T:  07/03/2011 22:59:31  Job:  [id number]

## 2011-07-06 ENCOUNTER — Telehealth: Payer: Self-pay | Admitting: Pulmonary Disease

## 2011-07-06 NOTE — Telephone Encounter (Signed)
Spoke with the pt and he has been scheduled for f/u on Wed., 5/22 @ 10:15am with K[facility] to discuss sleep results.

## 2011-07-07 ENCOUNTER — Ambulatory Visit (INDEPENDENT_AMBULATORY_CARE_PROVIDER_SITE_OTHER): Payer: Medicare Other | Admitting: Pulmonary Disease

## 2011-07-07 ENCOUNTER — Encounter: Payer: Self-pay | Admitting: Pulmonary Disease

## 2011-07-07 VITALS — BP 112/76 | HR 78 | Temp 97.7°F | Ht 72.0 in | Wt 242.2 lb

## 2011-07-07 DIAGNOSIS — G4733 Obstructive sleep apnea (adult) (pediatric): Secondary | ICD-10-CM

## 2011-07-07 NOTE — Progress Notes (Signed)
  Subjective:    Patient ID: [name] [name], male    DOB: 1940-07-27, 70 y.o.   MRN: [id number]  HPI The patient comes in today for followup of his recent sleep study.  He was found to have mild obstructive sleep apnea with an AHI of 5.4 events per hour, and an RDI of 32 events per hour.  I have reviewed this study with him in detail, and answered all of his questions.   Review of Systems  Constitutional: Negative.  Negative for fever and unexpected weight change.  HENT: Positive for sneezing and postnasal drip. Negative for ear pain, nosebleeds, congestion, sore throat, rhinorrhea, trouble swallowing, dental problem and sinus pressure.   Eyes: Negative.  Negative for redness and itching.  Respiratory: Positive for shortness of breath. Negative for cough, chest tightness and wheezing.   Cardiovascular: Negative.  Negative for palpitations and leg swelling.  Gastrointestinal: Negative.  Negative for nausea and vomiting.  Genitourinary: Negative.  Negative for dysuria.  Musculoskeletal: Negative.  Negative for joint swelling.  Skin: Negative.  Negative for rash.  Neurological: Positive for headaches.  Hematological: Negative.  Does not bruise/bleed easily.  Psychiatric/Behavioral: Negative.  Negative for dysphoric mood. The patient is not nervous/anxious.        Objective:   Physical Exam Overweight male in no acute distress Nose without purulence or discharge noted Lower extremities with mild edema, no cyanosis Alert and oriented, moves all 4 extremities.       Assessment & Plan:

## 2011-07-07 NOTE — Patient Instructions (Signed)
Will start on cpap, and see you back in 6 weeks.  Please call if having issues with tolerance Work on weight loss, with goal of 185 pounds.

## 2011-07-07 NOTE — Assessment & Plan Note (Signed)
The patient has mild obstructive sleep apnea by his recent sleep study, and is having significant sleep disruption at night with daytime sleepiness.  I discussed with him conservative treatment consisting of progressive weight loss, as well as more aggressive therapy such as CPAP and dental appliance while trying to work on weight loss.  The patient feels this is significantly impacting his quality of life, and would therefore like to start on treatment while he is trying to lose weight.  He would like to start with CPAP. I will set the patient up on cpap at a moderate pressure level to allow for desensitization, and will troubleshoot the device over the next 4-6weeks if needed.  The pt is to call me if having issues with tolerance.  Will then optimize the pressure once patient is able to wear cpap on a consistent basis.

## 2011-07-15 ENCOUNTER — Other Ambulatory Visit: Payer: Self-pay | Admitting: Endocrinology

## 2011-07-28 ENCOUNTER — Encounter (INDEPENDENT_AMBULATORY_CARE_PROVIDER_SITE_OTHER): Payer: Medicare Other | Admitting: General Surgery

## 2011-07-28 ENCOUNTER — Ambulatory Visit (INDEPENDENT_AMBULATORY_CARE_PROVIDER_SITE_OTHER): Payer: Medicare Other | Admitting: General Surgery

## 2011-07-28 ENCOUNTER — Encounter (INDEPENDENT_AMBULATORY_CARE_PROVIDER_SITE_OTHER): Payer: Self-pay | Admitting: General Surgery

## 2011-07-28 VITALS — BP 130/84 | HR 74 | Temp 97.7°F | Resp 14 | Ht 72.0 in | Wt 238.4 lb

## 2011-07-28 DIAGNOSIS — E210 Primary hyperparathyroidism: Secondary | ICD-10-CM

## 2011-07-28 NOTE — Progress Notes (Signed)
Patient ID: [name] [name], male   DOB: 1940-12-20, 71 y.o.   MRN: [id number]  Chief Complaint  Patient presents with  . Follow-up    parathyroid LOV 05-06-11    HPI [name] [name] is a 71 y.o. male.   HPI 71 yo AAM comes back into to discuss his primary hyperparathyroidism and the results of his sestamibi scan. He has not had his blood and urine lab work done (iPTH, 24 hr urine Calcium, etc) that we ordered at his last visit.  PMHx, PSHx, SOCHx, FAMHx, ALL reviewed and unchanged - except for dx of OSA  Past Medical History  Diagnosis Date  . Allergy   . Anxiety   . Asthma   . GERD (gastroesophageal reflux disease)   . Hyperlipidemia   . Hypertension   . Anemia   . RBBB (right bundle branch block)     stable  . Colon polyp     Past Surgical History  Procedure Date  . Elbow surgery     right  . Colonoscopy 02-24-09    per Dr. Kinnie Graddy, polyps, repeat in 5 yrs    Family History  Problem Relation Age of Onset  . Cancer Mother     stomach    Social History History  Substance Use Topics  . Smoking status: Former Smoker -- 1.0 packs/day for 27 years    Types: Cigarettes    Quit date: 02/15/1986  . Smokeless tobacco: Former Neurosurgeon    Quit date: 08/06/1979  . Alcohol Use: Yes     once or twice a month    No Known Allergies  Current Outpatient Prescriptions  Medication Sig Dispense Refill  . alendronate (FOSAMAX) 70 MG tablet TAKE 1 TABLET BY MOUTH EVERY WEEK  4 tablet  4  . amLODipine (NORVASC) 10 MG tablet TAKE 1 TABLET BY MOUTH DAILY  30 tablet  10  . aspirin 81 MG tablet Take 81 mg by mouth daily.      . benazepril (LOTENSIN) 20 MG tablet TAKE 1 TABLET BY MOUTH DAILY  30 tablet  10  . cetirizine (ZYRTEC) 10 MG tablet Take 10 mg by mouth as needed.       . [facility] IRON 325 (65 FE) MG tablet TAKE 1 TABLET BY MOUTH 2 TIMES A DAY  60 tablet  7  . fish oil-omega-3 fatty acids 1000 MG capsule Take 2 g by mouth daily.        . metoprolol (LOPRESSOR) 50 MG tablet Take 1  tablet (50 mg total) by mouth 2 (two) times daily.  60 tablet  11  . Multiple Vitamin (MULTIVITAMIN) tablet Take 1 tablet by mouth daily.      Marland Kitchen omeprazole (PRILOSEC) 20 MG capsule Take 1 capsule (20 mg total) by mouth daily.  30 capsule  11  . simvastatin (ZOCOR) 20 MG tablet Take 1 tablet (20 mg total) by mouth at bedtime.  30 tablet  11  . DISCONTD: amLODipine-benazepril (LOTREL) 10-20 MG per capsule Take 1 capsule by mouth daily.  30 capsule  11    Review of Systems Review of Systems  Constitutional: Negative for fever, chills, appetite change and unexpected weight change.  HENT: Negative for congestion and trouble swallowing.   Eyes: Negative for visual disturbance.  Respiratory: Negative for chest tightness and shortness of breath.        Recently dx with OSA - wearing CPAP mask but leaking some  Cardiovascular: Negative for chest pain and leg swelling.  No PND, no orthopnea, no DOE  Gastrointestinal:       See HPI  Genitourinary: Negative for dysuria and hematuria.  Musculoskeletal: Negative for arthralgias.  Skin: Negative for rash.  Neurological: Negative for seizures and speech difficulty.  Hematological: Does not bruise/bleed easily.  Psychiatric/Behavioral: Negative for behavioral problems and confusion.    Blood pressure 130/84, pulse 74, temperature 97.7 F (36.5 C), temperature source Temporal, resp. rate 14, height 6' (1.829 m), weight 238 lb 6.4 oz (108.138 kg).  Physical Exam Physical Exam  Vitals reviewed. Constitutional: He is oriented to person, place, and time. He appears well-developed and well-nourished. No distress.       obese  HENT:  Head: Normocephalic and atraumatic.  Right Ear: External ear normal.  Left Ear: External ear normal.  Eyes: Conjunctivae are normal. No scleral icterus.  Neck: Normal range of motion. Neck supple. No tracheal deviation present. No thyromegaly present.  Cardiovascular: Normal rate and normal heart sounds.     Pulmonary/Chest: Effort normal and breath sounds normal. No stridor. No respiratory distress. He has no wheezes.  Abdominal: Soft. He exhibits no distension.  Musculoskeletal: He exhibits no edema and no tenderness.  Lymphadenopathy:    He has no cervical adenopathy.  Neurological: He is alert and oriented to person, place, and time.  Skin: Skin is warm and dry. He is not diaphoretic.  Psychiatric: He has a normal mood and affect. His behavior is normal. Judgment and thought content normal.    Data Reviewed My last office note Sestambi scan results NM PARATHYROID SCINTIGRAPHY AND SPECT IMAGING 04/2011 Technique: Following intravenous administration of  radiopharmaceutical, early and 2-hour delayed planar images were  obtained in the anterior projection. Delayed triplanar SPECT  images were also obtained at 2 hours.  Radiopharmaceutical: Tc-41m Sestamibi IV  Comparison: None  Findings: On the 15-minute delayed images there is increased uptake  within both lobes of the thyroid gland, left greater than right.  On the delayed triplanar SPECT images there is a dominant focus of  persistent increased uptake localizing to the inferior pole of the  left lobe of the thyroid gland. Suspicious for parathyroid  adenoma.  IMPRESSION:  The parathyroid adenoma localizes to the inferior pole of the left  lobe of the thyroid gland.   Assessment    Primary Hyperparathyroidism - Parathyroid Adenoma    Plan    We discussed the results of his sestamibi scan which suggests a Left inferior parathyroid adenoma. We re-discussed the management of diagnosis of primary hyperparathyroidism. With his positive sestamibi scan, it appears that the likelihood of his hypercalcemia being due to another etiology is less likely.    We did re-discuss medical versus surgical treatment. We also re-discussed the potential benefits of a parathyroidectomy.We discussed the risk and benefits of surgery including  but not limited to bleeding, infection, injury to surrounding structures such as the thyroid or recurrent laryngeal nerve, neck hematoma, anesthesia complications, blood clot formation, failure to localize the adenoma, potential need for additional procedures, failure of his calcium level to normalize after surgery.  He would like to have surgery in about a month and a half to 2 months. We will schedule him for a minimally invasive parathyroidectomy. We did discuss the possibility of bilateral neck exploration. We also discussed the typical postoperative recovery course. In the interim, we will get him to get his lab work done as ordered.  Mary Sella. Andrey Campanile, MD, FACS General, Bariatric, & Minimally Invasive Surgery [facility], [place]  Gaynelle Adu M 07/28/2011, 2:43 PM

## 2011-07-28 NOTE — Patient Instructions (Signed)
Call our office at [phone] to schedule your surgery.  Please Get your blood work done   Parathyroidectomy A parathyroidectomy is surgery to remove one or more parathyroid glands. These glands produce a hormone (parathyroid hormone) that helps control the level of calcium in your body. The glands are very small, about the size of a pea. They are located in your neck, close to your thyroid gland and your Adam's apple. Most people (85%) have four parathyroid glands,some people may have one or two more than that. Hyperparathyroidism is when too much parathyroid hormone is being produced. Usually this is caused by one of the parathyroid glands becoming enlarged, but it can also be caused by more than one of the glands. Hyperparathyroidism is found during blood tests that show high calcium in the blood. Parathyroid hormone levels will also be elevated. Cancer also can cause hyperparathyroidism, but this is rare. For the most common type of hyperparathyroidism, the treatment is surgical removal of the parathyroid gland that is enlarged. For patients with kidney failure and hyperparathyroidism, other treatment will be tried before surgery is done on the parathyroid.  Many times x-ray studies are done to find out which parathyroid gland or glands is malfunctioning. The decision about the best treatment for hyperparathyroidism is between the patient, their primary doctor, an endocrinologist, and a surgeon experienced in parathyroid surgery. LET YOUR CAREGIVER KNOW ABOUT:  Any allergies.   All medications you are taking, including:   Herbs, eyedrops, over-the-counter medications and creams.   Blood thinners (anticoagulants), aspirin or other drugs that could affect blood clotting.   Use of steroids (by mouth or as creams).   Previous problems with anesthetics, including local anesthetics.   Possibility of pregnancy, if this applies.   Any history of blood clots.   Any history of bleeding or other  blood problems.   Previous surgery.   Smoking history.   Other health problems.  RISKS AND COMPLICATIONS   Short-term possibilities include:   Excessive bleeding.   Pain.   Infection near the incision.   Slow healing.   Pooling of blood under the wound (hematoma).   Damage to nerves in your neck.   Blood clots.   Difficulty breathing. This is very rare. It also is almost always temporary.   Longer-term possibilities include:   Scarring.   Skin damage.   Damage to blood vessels in the area.   Need for additional surgery.   A hoarse or weak voice. This is usually temporary. It can be the result of nerve damage.   Development of hypoparathyroidism. This means you are not making enough parathyroid hormone. It is rare. If it occurs, you will need to take calcium supplements daily.  BEFORE THE PROCEDURE  Sometimes the surgery is done on an outpatient basis. This means you could go home the same day as your surgery. Other times, people need to stay in the hospital overnight. Ask your surgeon what you should expect.   If your surgery will be an outpatient procedure, arrange for someone to drive you home after the surgery.   Two weeks before your surgery, stop using aspirin and non-steroidal anti-inflammatory drugs (NSAID's) for pain relief. This includes prescription drugs and over-the-counter drugs such as ibuprofen and naproxen. Also stop taking vitamin E.   If you take blood-thinners, ask your healthcare provider when you should stop taking them.   Do not eat or drink for about 8 hours before your surgery.   You might be asked to shower or  wash with a special antibacterial soap before the procedure.   Arrive at least an hour before the surgery, or whenever your surgeon recommends. This will give you time to check in and fill out any needed paperwork.  PROCEDURE  The preparation:   You will change into a hospital gown.   You will be given an IV. A needle will be  inserted in your arm. Medication will be able to flow directly into your body through this needle.   You might be given a sedative to help you relax.   You will be given a drug that puts you to sleep during the surgery (general anesthetic).   The procedure:   Once you are asleep, the surgeon will make a small cut (incision) in your lower neck. Ask your surgeon where the incision will be.   The surgeon will look for the gland(s) that are not working well. Often a tissue sample from a gland is used to determine this.   Any glands that are not working well will be removed.   The surgeon will close the incision with stitches, often these are hidden under the skin.  AFTER THE PROCEDURE  You will stay in a recovery area until the anesthesia has worn off. Your blood pressure and heart rate will be checked.   If your surgery was an outpatient procedure, you will go home the same day.   If you need to stay in the hospital, you will be moved to a hospital room. You will probably stay for two to three days. This will depend on how quickly you recover.   While you are in the hospital, your blood will be tested to check the calcium levels in your body.  HOME CARE INSTRUCTIONS   Take any medication that your surgeon prescribes. Follow the directions carefully. Take all of the medication.   Ask your surgeon whether you can take over-the-counter medicines for pain, discomfort or fever. Do not take aspirin unless your healthcare provider says to. Aspirin increases the chances of bleeding.   Do not get the wound wet for the first few days after surgery (or until the surgeon tells you it is OK).  SEEK MEDICAL CARE IF:   You notice blood or fluid leaking from the wound, or it becomes red or swollen.   You have trouble breathing.   You have trouble speaking.   You become nauseous or throw up for more than two days after the surgery.   You develop a fever of more than 100.5 F (38.1 C).  SEEK  IMMEDIATE MEDICAL CARE IF:   Breathing becomes more difficult.   You develop a fever of 102.0 F (38.9 C) or higher.  Document Released: 04/30/2008 Document Revised: 01/21/2011 Document Reviewed: 04/30/2008 [facility] Patient Information 2012 [place], [place].

## 2011-08-13 ENCOUNTER — Other Ambulatory Visit: Payer: Self-pay | Admitting: *Deleted

## 2011-08-13 LAB — PTH, INTACT AND CALCIUM: Calcium, Total (PTH): 11.5 mg/dL — ABNORMAL HIGH (ref 8.4–10.5)

## 2011-08-13 MED ORDER — ALENDRONATE SODIUM 70 MG PO TABS: ORAL_TABLET | ORAL | Status: DC

## 2011-08-13 NOTE — Telephone Encounter (Signed)
R'cd fax from [facility] for refill for Fosamax for 90 day supply.

## 2011-08-16 ENCOUNTER — Telehealth: Payer: Self-pay | Admitting: Family Medicine

## 2011-08-16 MED ORDER — SIMVASTATIN 20 MG PO TABS: 20.0000 mg | ORAL_TABLET | Freq: Every day | ORAL | Status: DC

## 2011-08-16 MED ORDER — OMEPRAZOLE 20 MG PO CPDR: 20.0000 mg | DELAYED_RELEASE_CAPSULE | Freq: Every day | ORAL | Status: DC

## 2011-08-16 MED ORDER — BENAZEPRIL HCL 20 MG PO TABS: 20.0000 mg | ORAL_TABLET | Freq: Every day | ORAL | Status: DC

## 2011-08-16 MED ORDER — AMLODIPINE BESYLATE 10 MG PO TABS: 10.0000 mg | ORAL_TABLET | Freq: Every day | ORAL | Status: DC

## 2011-08-16 MED ORDER — METOPROLOL TARTRATE 50 MG PO TABS: 50.0000 mg | ORAL_TABLET | Freq: Two times a day (BID) | ORAL | Status: DC

## 2011-08-16 NOTE — Telephone Encounter (Signed)
I [facility] for [facility] day supply.

## 2011-08-18 ENCOUNTER — Ambulatory Visit (INDEPENDENT_AMBULATORY_CARE_PROVIDER_SITE_OTHER): Payer: Medicare Other | Admitting: Pulmonary Disease

## 2011-08-18 ENCOUNTER — Encounter: Payer: Self-pay | Admitting: Pulmonary Disease

## 2011-08-18 ENCOUNTER — Telehealth: Payer: Self-pay | Admitting: Pulmonary Disease

## 2011-08-18 VITALS — BP 128/90 | HR 80 | Temp 97.8°F | Ht 72.0 in | Wt 238.2 lb

## 2011-08-18 DIAGNOSIS — G4733 Obstructive sleep apnea (adult) (pediatric): Secondary | ICD-10-CM

## 2011-08-18 NOTE — Telephone Encounter (Signed)
zoloft 25mg  added to pt's med list.    Called spoke with patient and informed him that the zoloft 25mg  has been added to his med list.  Pt requested a copy be faxed to him.  Faxed to the verified number above.  Nothing further needed.  Will sign off.

## 2011-08-18 NOTE — Patient Instructions (Addendum)
Continue on cpap Will get your machine set on auto in order to optimize your pressure, and will let you know the results once available.  Work on [employer] reduction Let me know how you are doing on cpap once you have had time on the device set at optimal pressure.  If doing well, followup with me in 6mos

## 2011-08-18 NOTE — Assessment & Plan Note (Signed)
The patient has seen definite improvement in his sleep and daytime alertness since being on the CPAP.  However, his symptoms have not normalized, and he continues to have some awakenings during the night.  I have explained to him that we have yet to optimize his pressure, and would like to do this on the automatic setting for the next few days.  I can then let him know that his optimal pressure.  I have also encouraged the patient worked aggressively on weight loss.

## 2011-08-18 NOTE — Progress Notes (Signed)
  Subjective:    Patient ID: [name] [name], male    DOB: 1940/10/17, 71 y.o.   MRN: [id number]  HPI The patient comes in today for followup of his known mild obstructive sleep apnea.  He was started on CPAP at the last visit to see if his symptoms would improve, and he has seen a definite change in how he feels the following morning.  He has less arousals and awakenings during the night, but still has a few.  He is having no issues with his mask or pressure.   Review of Systems  Constitutional: Negative for fever and unexpected weight change.  HENT: Positive for nosebleeds, congestion, sore throat, sneezing and sinus pressure. Negative for ear pain, rhinorrhea, trouble swallowing, dental problem and postnasal drip.   Eyes: Positive for redness and itching.  Respiratory: Positive for chest tightness and shortness of breath. Negative for cough and wheezing.   Cardiovascular: Positive for leg swelling. Negative for palpitations.  Gastrointestinal: Negative for nausea and vomiting.  Genitourinary: Negative for dysuria.  Musculoskeletal: Negative for joint swelling.  Skin: Negative for rash.  Neurological: Positive for headaches.  Hematological: Does not bruise/bleed easily.  Psychiatric/Behavioral: Positive for dysphoric mood. The patient is nervous/anxious.        Objective:   Physical Exam Obese male in no acute distress No skin breakdown or pressure necrosis from the CPAP mask Lower extremities with edema noted, no cyanosis Alert, does not appear to be sleepy, moves all 4 extremities.       Assessment & Plan:

## 2011-08-27 ENCOUNTER — Encounter (INDEPENDENT_AMBULATORY_CARE_PROVIDER_SITE_OTHER): Payer: Medicare Other | Admitting: General Surgery

## 2011-09-14 ENCOUNTER — Encounter: Payer: Self-pay | Admitting: Pulmonary Disease

## 2011-09-16 ENCOUNTER — Telehealth: Payer: Self-pay | Admitting: *Deleted

## 2011-09-16 ENCOUNTER — Other Ambulatory Visit: Payer: Self-pay | Admitting: Pulmonary Disease

## 2011-09-16 DIAGNOSIS — G4733 Obstructive sleep apnea (adult) (pediatric): Secondary | ICD-10-CM

## 2011-09-16 NOTE — Telephone Encounter (Signed)
Note opened in error.

## 2011-10-25 ENCOUNTER — Encounter: Payer: Self-pay | Admitting: Pulmonary Disease

## 2011-10-27 ENCOUNTER — Other Ambulatory Visit: Payer: Self-pay | Admitting: Pulmonary Disease

## 2011-10-27 DIAGNOSIS — G4733 Obstructive sleep apnea (adult) (pediatric): Secondary | ICD-10-CM

## 2011-10-29 ENCOUNTER — Telehealth (INDEPENDENT_AMBULATORY_CARE_PROVIDER_SITE_OTHER): Payer: Self-pay

## 2011-10-29 NOTE — Telephone Encounter (Addendum)
Spoke with patient. He wanted to know his calcium numbers over the last two draws. I made him aware they are about the same. He wants to know any other treatments prior to surgery. I made him aware surgery was the best treatment. He then requested to speak with Dr. Andrey Campanile and they discussed that surgery was the treatment option. Patient will contact Dr Everardo All for additional information and will cancel his surgery. He will call back when he is ready.

## 2011-10-29 NOTE — Telephone Encounter (Signed)
[name] [name] at pre surg testing [phone] wanted to let our office know pt stated he had issues and was not sure about surgery. I called pt and he states he wants to speak with Dr Andrey Campanile prior to keeping surgery date. Pt can be reach at [phone] or [phone]. Pt advised msg will be sent to Dr Andrey Campanile and [name].

## 2011-11-05 ENCOUNTER — Encounter (HOSPITAL_COMMUNITY): Admission: RE | Payer: Self-pay | Source: Ambulatory Visit

## 2011-11-05 ENCOUNTER — Ambulatory Visit (HOSPITAL_COMMUNITY): Admission: RE | Admit: 2011-11-05 | Payer: Medicare Other | Source: Ambulatory Visit | Admitting: General Surgery

## 2011-11-05 SURGERY — PARATHYROIDECTOMY
Anesthesia: General

## 2011-12-24 ENCOUNTER — Other Ambulatory Visit: Payer: Self-pay | Admitting: Endocrinology

## 2011-12-24 ENCOUNTER — Other Ambulatory Visit: Payer: Self-pay | Admitting: Family Medicine

## 2011-12-27 ENCOUNTER — Other Ambulatory Visit: Payer: Self-pay | Admitting: *Deleted

## 2011-12-27 MED ORDER — ALENDRONATE SODIUM 70 MG PO TABS: ORAL_TABLET | ORAL | Status: DC

## 2011-12-30 ENCOUNTER — Other Ambulatory Visit (INDEPENDENT_AMBULATORY_CARE_PROVIDER_SITE_OTHER): Payer: Medicare Other

## 2011-12-30 DIAGNOSIS — Z125 Encounter for screening for malignant neoplasm of prostate: Secondary | ICD-10-CM

## 2011-12-30 DIAGNOSIS — Z0000 Encounter for general adult medical examination without abnormal findings: Secondary | ICD-10-CM

## 2011-12-30 DIAGNOSIS — Z79899 Other long term (current) drug therapy: Secondary | ICD-10-CM

## 2011-12-30 DIAGNOSIS — E785 Hyperlipidemia, unspecified: Secondary | ICD-10-CM

## 2011-12-30 LAB — POCT URINALYSIS DIPSTICK
Bilirubin, UA: NEGATIVE
Blood, UA: NEGATIVE
Glucose, UA: NEGATIVE
Ketones, UA: NEGATIVE
Spec Grav, UA: 1.02

## 2011-12-30 LAB — HEPATIC FUNCTION PANEL
ALT: 34 U/L (ref 0–53)
AST: 27 U/L (ref 0–37)
Alkaline Phosphatase: 56 U/L (ref 39–117)
Bilirubin, Direct: 0.1 mg/dL (ref 0.0–0.3)
Total Bilirubin: 0.5 mg/dL (ref 0.3–1.2)

## 2011-12-30 LAB — PSA: PSA: 0.7 ng/mL (ref 0.10–4.00)

## 2011-12-30 LAB — LIPID PANEL
Cholesterol: 173 mg/dL (ref 0–200)
LDL Cholesterol: 85 mg/dL (ref 0–99)
Triglycerides: 61 mg/dL (ref 0.0–149.0)

## 2011-12-30 LAB — CBC WITH DIFFERENTIAL/PLATELET
Basophils Absolute: 0 10*3/uL (ref 0.0–0.1)
Eosinophils Absolute: 0.1 10*3/uL (ref 0.0–0.7)
Hemoglobin: 12 g/dL — ABNORMAL LOW (ref 13.0–17.0)
Lymphocytes Relative: 28.5 % (ref 12.0–46.0)
MCHC: 32.6 g/dL (ref 30.0–36.0)
MCV: 92.5 fl (ref 78.0–100.0)
Monocytes Absolute: 0.4 10*3/uL (ref 0.1–1.0)
Neutro Abs: 4.4 10*3/uL (ref 1.4–7.7)
RDW: 14.9 % — ABNORMAL HIGH (ref 11.5–14.6)

## 2011-12-30 LAB — BASIC METABOLIC PANEL
Calcium: 11.3 mg/dL — ABNORMAL HIGH (ref 8.4–10.5)
Chloride: 104 mEq/L (ref 96–112)
Creatinine, Ser: 1.2 mg/dL (ref 0.4–1.5)
GFR: 76.73 mL/min (ref >60.00)

## 2012-01-03 NOTE — Progress Notes (Signed)
Quick Note:  I spoke with pt and he is coming in on 01/07/12 for CPE. He would like to discuss the labs with you first before scheduling the visit with Dr. Andrey Campanile. ______

## 2012-01-07 ENCOUNTER — Ambulatory Visit (INDEPENDENT_AMBULATORY_CARE_PROVIDER_SITE_OTHER): Payer: Medicare Other | Admitting: Family Medicine

## 2012-01-07 ENCOUNTER — Encounter: Payer: Self-pay | Admitting: Family Medicine

## 2012-01-07 VITALS — BP 136/84 | HR 87 | Temp 98.2°F | Ht 71.5 in | Wt 237.0 lb

## 2012-01-07 DIAGNOSIS — Z87891 Personal history of nicotine dependence: Secondary | ICD-10-CM

## 2012-01-07 DIAGNOSIS — Z0000 Encounter for general adult medical examination without abnormal findings: Secondary | ICD-10-CM

## 2012-01-07 DIAGNOSIS — Z23 Encounter for immunization: Secondary | ICD-10-CM

## 2012-01-07 MED ORDER — TAMSULOSIN HCL 0.4 MG PO CAPS: 0.4000 mg | ORAL_CAPSULE | Freq: Every day | ORAL | Status: DC

## 2012-01-07 NOTE — Addendum Note (Signed)
Addended by: Aniceto Boss A on: 01/07/2012 12:07 PM   Modules accepted: Orders

## 2012-01-07 NOTE — Progress Notes (Signed)
  Subjective:    Patient ID: [name] [name], male    DOB: [birth year], 71 y.o.   MRN: [id number]  HPI 71 yr old male for a cpx. He feels well although he mentions more urinary urgency and frequency. No discomfort.    Review of Systems  Constitutional: Negative.   HENT: Negative.   Eyes: Negative.   Respiratory: Negative.   Cardiovascular: Negative.   Gastrointestinal: Negative.   Genitourinary: Negative.   Musculoskeletal: Negative.   Skin: Negative.   Neurological: Negative.   Hematological: Negative.   Psychiatric/Behavioral: Negative.        Objective:   Physical Exam  Constitutional: He is oriented to person, place, and time. He appears well-developed and well-nourished. No distress.  HENT:  Head: Normocephalic and atraumatic.  Right Ear: External ear normal.  Left Ear: External ear normal.  Nose: Nose normal.  Mouth/Throat: Oropharynx is clear and moist. No oropharyngeal exudate.  Eyes: Conjunctivae normal and EOM are normal. Pupils are equal, round, and reactive to light. Right eye exhibits no discharge. Left eye exhibits no discharge. No scleral icterus.  Neck: Neck supple. No JVD present. No tracheal deviation present. No thyromegaly present.  Cardiovascular: Normal rate, regular rhythm, normal heart sounds and intact distal pulses.  Exam reveals no gallop and no friction rub.   No murmur heard.      EKG at his baseline with RBBB and some PVCs  Pulmonary/Chest: Effort normal and breath sounds normal. No respiratory distress. He has no wheezes. He has no rales. He exhibits no tenderness.  Abdominal: Soft. Bowel sounds are normal. He exhibits no distension and no mass. There is no tenderness. There is no rebound and no guarding.  Genitourinary: Rectum normal and penis normal. Guaiac negative stool. No penile tenderness.       Prostate mildly enlarged , no nodules or tenderness   Musculoskeletal: Normal range of motion. He exhibits no edema and no tenderness.    Lymphadenopathy:    He has no cervical adenopathy.  Neurological: He is alert and oriented to person, place, and time. He has normal reflexes. No cranial nerve deficit. He exhibits normal muscle tone. Coordination normal.  Skin: Skin is warm and dry. No rash noted. He is not diaphoretic. No erythema. No pallor.  Psychiatric: He has a normal mood and affect. His behavior is normal. Judgment and thought content normal.          Assessment & Plan:  Well exam. Try Flomax for the BPH. Get a CXR.

## 2012-01-18 ENCOUNTER — Telehealth: Payer: Self-pay | Admitting: Family Medicine

## 2012-01-18 ENCOUNTER — Ambulatory Visit (INDEPENDENT_AMBULATORY_CARE_PROVIDER_SITE_OTHER): Payer: Medicare Other | Admitting: Family Medicine

## 2012-01-18 ENCOUNTER — Encounter: Payer: Self-pay | Admitting: Family Medicine

## 2012-01-18 VITALS — BP 148/90 | HR 88 | Temp 97.8°F | Wt 238.0 lb

## 2012-01-18 DIAGNOSIS — J329 Chronic sinusitis, unspecified: Secondary | ICD-10-CM

## 2012-01-18 MED ORDER — AZITHROMYCIN 250 MG PO TABS: ORAL_TABLET | ORAL | Status: DC

## 2012-01-18 NOTE — Telephone Encounter (Signed)
[facility] Triage Call Report Triage Record Num: [id number] Operator: Jeraldine Loots Patient Name: [name] [name] Call Date & Time: 01/17/2012 5:38:49PM Patient Phone: [phone] PCP: Tera Mater. Clent Ridges Patient Gender: Male PCP Fax : [phone] Patient DOB: [birth year] [facility] Name: [facility] Reason for Call: Patient calling, has had cold and nasal congestion for weeks. No improvment. No cough. Just not feeling good at all today. Feels warm but is at work and hasn't checked his temp. Asked for antibiotics to be called in. Triaged per URI. Scheduled for 0930 in am with Dr. Clent Ridges. Protocol(s) Used: Flu-Like Symptoms Protocol(s) Used: Upper Respiratory Infection (URI) Recommended Outcome per Protocol: See Provider within 24 hours Reason for Outcome: Gradual onset of upper respiratory illness signs and symptoms (If there is any doubt that symptoms may be an upper respiratory illness, use this guideline, Flu-Like Symptoms ) Symptoms worsen after 7 days or symptoms do not improve after 14 days of home care Care Advice: ~ SYMPTOM / CONDITION MANAGEMENT 12/02/

## 2012-01-18 NOTE — Progress Notes (Signed)
  Subjective:    Patient ID: [name] [name], male    DOB: October 31, 1940, 71 y.o.   MRN: [id number]  HPI Here for 2 weeks of sinus pressure, PND, HA, and ST. No cough or fever. Using Xyzal daily.    Review of Systems  Constitutional: Negative.   HENT: Positive for congestion, postnasal drip and sinus pressure.   Eyes: Positive for itching. Negative for pain, discharge and redness.  Respiratory: Negative.        Objective:   Physical Exam  Constitutional: He appears well-developed and well-nourished. No distress.  HENT:  Right Ear: External ear normal.  Left Ear: External ear normal.  Nose: Nose normal.  Mouth/Throat: Oropharynx is clear and moist. No oropharyngeal exudate.  Eyes: Conjunctivae normal are normal.  Neck: No thyromegaly present.  Pulmonary/Chest: Effort normal and breath sounds normal.  Lymphadenopathy:    He has no cervical adenopathy.          Assessment & Plan:  Add Mucinex bid

## 2012-01-19 ENCOUNTER — Other Ambulatory Visit: Payer: Self-pay | Admitting: Endocrinology

## 2012-01-19 ENCOUNTER — Other Ambulatory Visit: Payer: Self-pay | Admitting: Family Medicine

## 2012-01-20 NOTE — Telephone Encounter (Signed)
Should rx be dc'd

## 2012-01-20 NOTE — Telephone Encounter (Signed)
Please refill x 1 Ov needed prior to further refills

## 2012-01-28 ENCOUNTER — Telehealth: Payer: Self-pay | Admitting: Family Medicine

## 2012-01-28 NOTE — Telephone Encounter (Signed)
This patient did not complete chest films ordered on 01/07/12

## 2012-01-28 NOTE — Telephone Encounter (Signed)
noted 

## 2012-03-09 ENCOUNTER — Other Ambulatory Visit: Payer: Self-pay | Admitting: Family Medicine

## 2012-03-13 ENCOUNTER — Other Ambulatory Visit: Payer: Self-pay | Admitting: *Deleted

## 2012-04-25 ENCOUNTER — Other Ambulatory Visit: Payer: Self-pay | Admitting: *Deleted

## 2012-04-25 MED ORDER — ALENDRONATE SODIUM 70 MG PO TABS: 70.0000 mg | ORAL_TABLET | ORAL | Status: DC

## 2012-05-19 ENCOUNTER — Other Ambulatory Visit: Payer: Self-pay | Admitting: Family Medicine

## 2012-07-26 ENCOUNTER — Telehealth: Payer: Self-pay | Admitting: Family Medicine

## 2012-07-26 MED ORDER — METOPROLOL TARTRATE 50 MG PO TABS: 50.0000 mg | ORAL_TABLET | Freq: Two times a day (BID) | ORAL | Status: DC

## 2012-07-26 NOTE — Telephone Encounter (Signed)
Refill request for Metoprolol 50 mg and I did send e-scribe.

## 2012-08-28 ENCOUNTER — Other Ambulatory Visit: Payer: Self-pay | Admitting: Family Medicine

## 2012-10-12 ENCOUNTER — Other Ambulatory Visit: Payer: Self-pay | Admitting: Family Medicine

## 2012-10-12 ENCOUNTER — Other Ambulatory Visit: Payer: Self-pay | Admitting: *Deleted

## 2012-10-12 MED ORDER — ALENDRONATE SODIUM 70 MG PO TABS: 70.0000 mg | ORAL_TABLET | ORAL | Status: DC

## 2012-10-13 ENCOUNTER — Telehealth: Payer: Self-pay | Admitting: Family Medicine

## 2012-10-13 MED ORDER — METOPROLOL TARTRATE 50 MG PO TABS: 50.0000 mg | ORAL_TABLET | Freq: Two times a day (BID) | ORAL | Status: DC

## 2012-10-13 NOTE — Telephone Encounter (Signed)
Refill request for Metoprolol 50 mg take 1 po bid and I did send script e-scribe.

## 2012-10-19 ENCOUNTER — Telehealth: Payer: Self-pay | Admitting: Family Medicine

## 2012-10-19 MED ORDER — SIMVASTATIN 20 MG PO TABS: 20.0000 mg | ORAL_TABLET | Freq: Every day | ORAL | Status: DC

## 2012-10-19 NOTE — Telephone Encounter (Signed)
Refill request for Simvastatin, Omeprazole, Tamsulosin, Amlodipine, & Metoprolol. ( [facility] ) I did send all scripts e-scribe.

## 2012-10-20 MED ORDER — TAMSULOSIN HCL 0.4 MG PO CAPS: 0.4000 mg | ORAL_CAPSULE | Freq: Every day | ORAL | Status: DC

## 2012-10-20 MED ORDER — AMLODIPINE BESYLATE 10 MG PO TABS: 10.0000 mg | ORAL_TABLET | Freq: Every day | ORAL | Status: DC

## 2012-10-20 MED ORDER — METOPROLOL TARTRATE 50 MG PO TABS: 50.0000 mg | ORAL_TABLET | Freq: Two times a day (BID) | ORAL | Status: DC

## 2012-10-20 MED ORDER — OMEPRAZOLE 20 MG PO CPDR: 20.0000 mg | DELAYED_RELEASE_CAPSULE | Freq: Every day | ORAL | Status: DC

## 2012-10-23 ENCOUNTER — Telehealth: Payer: Self-pay | Admitting: Family Medicine

## 2012-10-23 MED ORDER — FERROUS SULFATE 325 (65 FE) MG PO TABS: 325.0000 mg | ORAL_TABLET | Freq: Two times a day (BID) | ORAL | Status: DC

## 2012-10-23 NOTE — Telephone Encounter (Signed)
Refill request for Ferrous Sulfate 325 mg take 1 po bid and I [facility].

## 2013-01-01 ENCOUNTER — Other Ambulatory Visit (INDEPENDENT_AMBULATORY_CARE_PROVIDER_SITE_OTHER): Payer: Medicare Other

## 2013-01-01 ENCOUNTER — Other Ambulatory Visit: Payer: Self-pay | Admitting: *Deleted

## 2013-01-01 DIAGNOSIS — Z125 Encounter for screening for malignant neoplasm of prostate: Secondary | ICD-10-CM

## 2013-01-01 DIAGNOSIS — Z0000 Encounter for general adult medical examination without abnormal findings: Secondary | ICD-10-CM

## 2013-01-01 DIAGNOSIS — I10 Essential (primary) hypertension: Secondary | ICD-10-CM

## 2013-01-01 LAB — CBC WITH DIFFERENTIAL/PLATELET
Basophils Relative: 0.5 % (ref 0.0–3.0)
Eosinophils Relative: 3.8 % (ref 0.0–5.0)
HCT: 37.1 % — ABNORMAL LOW (ref 39.0–52.0)
Lymphs Abs: 2.1 10*3/uL (ref 0.7–4.0)
MCV: 89.4 fl (ref 78.0–100.0)
Monocytes Absolute: 0.5 10*3/uL (ref 0.1–1.0)
RBC: 4.15 Mil/uL — ABNORMAL LOW (ref 4.22–5.81)
WBC: 6.7 10*3/uL (ref 4.5–10.5)

## 2013-01-01 LAB — HEPATIC FUNCTION PANEL
ALT: 34 U/L (ref 0–53)
Total Bilirubin: 0.7 mg/dL (ref 0.3–1.2)

## 2013-01-01 LAB — POCT URINALYSIS DIPSTICK
Bilirubin, UA: NEGATIVE
Glucose, UA: NEGATIVE
Nitrite, UA: NEGATIVE

## 2013-01-01 LAB — BASIC METABOLIC PANEL
BUN: 14 mg/dL (ref 6–23)
CO2: 29 mEq/L (ref 19–32)
Chloride: 105 mEq/L (ref 96–112)
Potassium: 4.6 mEq/L (ref 3.5–5.1)

## 2013-01-01 LAB — PSA: PSA: 1.16 ng/mL (ref 0.10–4.00)

## 2013-01-01 LAB — LIPID PANEL: Total CHOL/HDL Ratio: 3

## 2013-01-01 LAB — TSH: TSH: 0.94 u[IU]/mL (ref 0.35–5.50)

## 2013-01-02 LAB — LDL CHOLESTEROL, DIRECT: Direct LDL: 121.8 mg/dL

## 2013-01-08 ENCOUNTER — Ambulatory Visit (INDEPENDENT_AMBULATORY_CARE_PROVIDER_SITE_OTHER): Payer: Medicare Other | Admitting: Family Medicine

## 2013-01-08 ENCOUNTER — Encounter: Payer: Self-pay | Admitting: Family Medicine

## 2013-01-08 VITALS — BP 140/76 | HR 89 | Temp 98.1°F | Ht 70.0 in | Wt 238.0 lb

## 2013-01-08 DIAGNOSIS — M810 Age-related osteoporosis without current pathological fracture: Secondary | ICD-10-CM

## 2013-01-08 DIAGNOSIS — L918 Other hypertrophic disorders of the skin: Secondary | ICD-10-CM

## 2013-01-08 DIAGNOSIS — M25569 Pain in unspecified knee: Secondary | ICD-10-CM

## 2013-01-08 DIAGNOSIS — M25562 Pain in left knee: Secondary | ICD-10-CM

## 2013-01-08 DIAGNOSIS — Z0000 Encounter for general adult medical examination without abnormal findings: Secondary | ICD-10-CM

## 2013-01-08 DIAGNOSIS — I10 Essential (primary) hypertension: Secondary | ICD-10-CM

## 2013-01-08 DIAGNOSIS — L909 Atrophic disorder of skin, unspecified: Secondary | ICD-10-CM

## 2013-01-08 NOTE — Progress Notes (Signed)
Pre visit review using our clinic review tool, if applicable. No additional management support is needed unless otherwise documented below in the visit note. 

## 2013-01-08 NOTE — Progress Notes (Signed)
  Subjective:    Patient ID: [name] [name], male    DOB: May 27, 1940, 72 y.o.   MRN: [id number]  HPI 72 yr old male for a cpx. He has a few things to discuss. His left knee has been causing him pain for several years and now it is getting worse. He has popping and cracking in it and he feels like it may give way at times. No locking. He also has a large lesion on the upper back that gets sore and rubs on his clothing.    Review of Systems  Constitutional: Negative.   HENT: Negative.   Eyes: Negative.   Respiratory: Negative.   Cardiovascular: Negative.   Gastrointestinal: Negative.   Genitourinary: Negative.   Musculoskeletal: Negative.   Skin: Negative.   Neurological: Negative.   Psychiatric/Behavioral: Negative.        Objective:   Physical Exam  Constitutional: He is oriented to person, place, and time. He appears well-developed and well-nourished. No distress.  HENT:  Head: Normocephalic and atraumatic.  Right Ear: External ear normal.  Left Ear: External ear normal.  Nose: Nose normal.  Mouth/Throat: Oropharynx is clear and moist. No oropharyngeal exudate.  Eyes: Conjunctivae and EOM are normal. Pupils are equal, round, and reactive to light. Right eye exhibits no discharge. Left eye exhibits no discharge. No scleral icterus.  Neck: Neck supple. No JVD present. No tracheal deviation present. No thyromegaly present.  Cardiovascular: Normal rate, regular rhythm, normal heart sounds and intact distal pulses.  Exam reveals no gallop and no friction rub.   No murmur heard. EKG shows stable RBBB  Pulmonary/Chest: Effort normal and breath sounds normal. No respiratory distress. He has no wheezes. He has no rales. He exhibits no tenderness.  Abdominal: Soft. Bowel sounds are normal. He exhibits no distension and no mass. There is no tenderness. There is no rebound and no guarding.  Genitourinary: Rectum normal, prostate normal and penis normal. Guaiac negative stool. No penile  tenderness.  Musculoskeletal: Normal range of motion. He exhibits no edema and no tenderness.  Lymphadenopathy:    He has no cervical adenopathy.  Neurological: He is alert and oriented to person, place, and time. He has normal reflexes. No cranial nerve deficit. He exhibits normal muscle tone. Coordination normal.  Skin: Skin is warm and dry. No rash noted. He is not diaphoretic. No erythema. No pallor.  Psychiatric: He has a normal mood and affect. His behavior is normal. Judgment and thought content normal.          Assessment & Plan:  Well exam. 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 Wear a support sleeve. Set up another DEXA. The skin tag was excised with a pair of scissors.

## 2013-02-19 ENCOUNTER — Ambulatory Visit (INDEPENDENT_AMBULATORY_CARE_PROVIDER_SITE_OTHER)
Admission: RE | Admit: 2013-02-19 | Discharge: 2013-02-19 | Disposition: A | Discharge status: Home / Self Care | Payer: Medicare Other | Source: Ambulatory Visit | Attending: Family Medicine | Admitting: Family Medicine

## 2013-02-19 DIAGNOSIS — M810 Age-related osteoporosis without current pathological fracture: Secondary | ICD-10-CM

## 2013-03-25 ENCOUNTER — Other Ambulatory Visit: Payer: Self-pay | Admitting: Endocrinology

## 2013-03-26 NOTE — Telephone Encounter (Signed)
Please advise, Thanks!  

## 2013-03-26 NOTE — Telephone Encounter (Signed)
Refill x 1 Ov is due 

## 2013-03-28 ENCOUNTER — Other Ambulatory Visit: Payer: Self-pay | Admitting: Family Medicine

## 2013-04-04 ENCOUNTER — Other Ambulatory Visit: Payer: Self-pay | Admitting: Family Medicine

## 2013-04-05 ENCOUNTER — Telehealth: Payer: Self-pay

## 2013-04-05 NOTE — Telephone Encounter (Signed)
[facility] requesting [facility] refill on Fosamax. Last refill pt was advised to make appointment and did not. Please advise if ok to refill, Thanks!

## 2013-04-05 NOTE — Telephone Encounter (Signed)
No refill until appointment

## 2013-04-05 NOTE — Telephone Encounter (Signed)
Pharmacy notified.

## 2013-04-25 ENCOUNTER — Other Ambulatory Visit: Payer: Self-pay | Admitting: Family Medicine

## 2013-04-26 ENCOUNTER — Telehealth: Payer: Self-pay | Admitting: Internal Medicine

## 2013-04-26 NOTE — Telephone Encounter (Signed)
Spoke with the pt  He states that he was actually trying to reach Dr Idelle Crouch office when he called  Nothing needed from this clinic per pt

## 2013-05-02 ENCOUNTER — Telehealth: Payer: Self-pay | Admitting: Pulmonary Disease

## 2013-05-02 NOTE — Telephone Encounter (Signed)
Spoke to pt in lobby.  Pt is being seen at [facility] tomorrow and is requesting a letter from Dr Gwenette Greet stating what he treats him for and if it his opinion that his condtion could have been caused by Agant Orange.  Explained to pt that he has not been seen since 08/18/2011 and Dr Gwenette Greet may not be able to comment on this until he has f/u.  Pt states because his appt is tomorrow  he still wants [place] to ask Dr Gwenette Greet if he will write letter.  I have placed a letter from pt for Dr Gwenette Greet to review on Ashtyn's desk.

## 2013-05-02 NOTE — Telephone Encounter (Signed)
Lmomtcb x 1 

## 2013-05-02 NOTE — Telephone Encounter (Signed)
Spoke with Dr Gwenette Greet and he cannot write a letter since he has not seen pt since 08/18/11.  Pt notified of this.

## 2013-05-04 ENCOUNTER — Telehealth: Payer: Self-pay | Admitting: Pulmonary Disease

## 2013-05-04 NOTE — Telephone Encounter (Signed)
Please let pt know that I received his letter.  To my knowledge, agent orange is not associated with obesity and sleep apnea.  It is controversial whether it causes respiratory illness.  Some studies say yes, others have found no association.  The [facility] system is really better qualified to answer these questions, since they deal with it more often than doctors in the community.

## 2013-05-04 NOTE — Telephone Encounter (Signed)
Spoke with patient in regards to letter that Dr Gwenette Greet reviewed. Pt was needing this to help with his disability case. Pt expressed understanding and stated that he will continue to work with [place] in getting his Disability approved.   Nothing further needed. Letter placed to be scanned.

## 2013-05-16 ENCOUNTER — Other Ambulatory Visit: Payer: Self-pay | Admitting: Endocrinology

## 2013-06-03 ENCOUNTER — Other Ambulatory Visit: Payer: Self-pay | Admitting: Family Medicine

## 2013-06-30 ENCOUNTER — Other Ambulatory Visit: Payer: Self-pay | Admitting: Family Medicine

## 2013-08-09 ENCOUNTER — Telehealth: Payer: Self-pay | Admitting: Family Medicine

## 2013-08-09 NOTE — Telephone Encounter (Signed)
Pt called to say that his insurance will pay for  Cialis  Pt is requesting a rx for this  Pharmacy  [place]

## 2013-08-13 NOTE — Telephone Encounter (Signed)
Call in Cialis 20 mg to use prn, #10 with 11 rf 

## 2013-08-13 NOTE — Telephone Encounter (Signed)
Pt following up on med refill. Pt aware dr fry will [facility]

## 2013-08-15 ENCOUNTER — Telehealth: Payer: Self-pay | Admitting: Family Medicine

## 2013-08-15 MED ORDER — TADALAFIL 20 MG PO TABS: 20.0000 mg | ORAL_TABLET | Freq: Every day | ORAL | Status: DC | PRN

## 2013-08-15 NOTE — Telephone Encounter (Signed)
I received a call from [facility] stating Cialis 20 mg isn't covered under Medicare Part D.  I was told Cialis 2.5 & 5 mg, 30 per 30 days is covered but it will require a PA.  Can you change the RX and re-send?

## 2013-08-15 NOTE — Telephone Encounter (Signed)
I sent script e-scribe and left a voice message for pt. 

## 2013-08-20 MED ORDER — TADALAFIL 5 MG PO TABS: ORAL_TABLET | ORAL | Status: DC

## 2013-08-20 NOTE — Telephone Encounter (Signed)
This was sent e-scribe to pharmacy, is there anything else we need to do with this?

## 2013-08-20 NOTE — Telephone Encounter (Signed)
done

## 2013-08-21 NOTE — Telephone Encounter (Signed)
No thanks  

## 2013-08-21 NOTE — Telephone Encounter (Signed)
I called [facility] and submitted the PA.  [id number], it is currently pending.

## 2013-08-21 NOTE — Telephone Encounter (Signed)
Pt states his insurance need prior authorization for a new med, and to contact [phone] to prior authorizations.

## 2013-08-22 NOTE — Telephone Encounter (Signed)
PA was APPROVED.  [facility]

## 2013-11-19 ENCOUNTER — Telehealth: Payer: Self-pay | Admitting: Family Medicine

## 2013-11-19 NOTE — Telephone Encounter (Signed)
[id number] [place] [place] is requesting 90 day re-fills on omeprazole (PRILOSEC) 20 MG capsule And metoprolol (LOPRESSOR) 50 MG tablet

## 2013-11-22 MED ORDER — OMEPRAZOLE 20 MG PO CPDR: 20.0000 mg | DELAYED_RELEASE_CAPSULE | Freq: Every day | ORAL | Status: DC

## 2013-11-22 MED ORDER — METOPROLOL TARTRATE 50 MG PO TABS: 50.0000 mg | ORAL_TABLET | Freq: Two times a day (BID) | ORAL | Status: DC

## 2013-11-22 NOTE — Addendum Note (Signed)
Addended by: Aggie Hacker A on: 11/22/2013 11:55 AM   Modules accepted: Orders

## 2013-11-22 NOTE — Telephone Encounter (Signed)
I sent both scripts e-scribe. 

## 2013-11-30 ENCOUNTER — Other Ambulatory Visit: Payer: Self-pay | Admitting: Family Medicine

## 2013-12-31 ENCOUNTER — Other Ambulatory Visit: Payer: Self-pay | Admitting: Family Medicine

## 2013-12-31 NOTE — Telephone Encounter (Signed)
Can we refill this? 

## 2014-01-06 ENCOUNTER — Other Ambulatory Visit: Payer: Self-pay | Admitting: Family Medicine

## 2014-01-21 ENCOUNTER — Other Ambulatory Visit (INDEPENDENT_AMBULATORY_CARE_PROVIDER_SITE_OTHER): Payer: Medicare Other

## 2014-01-21 DIAGNOSIS — I10 Essential (primary) hypertension: Secondary | ICD-10-CM

## 2014-01-21 DIAGNOSIS — E785 Hyperlipidemia, unspecified: Secondary | ICD-10-CM

## 2014-01-21 DIAGNOSIS — Z0000 Encounter for general adult medical examination without abnormal findings: Secondary | ICD-10-CM

## 2014-01-21 DIAGNOSIS — Z125 Encounter for screening for malignant neoplasm of prostate: Secondary | ICD-10-CM

## 2014-01-21 LAB — POCT URINALYSIS DIPSTICK
Bilirubin, UA: NEGATIVE
Glucose, UA: NEGATIVE
Ketones, UA: NEGATIVE
Nitrite, UA: NEGATIVE
Protein, UA: NEGATIVE
SPEC GRAV UA: 1.015
UROBILINOGEN UA: 0.2
pH, UA: 7

## 2014-01-21 LAB — HEPATIC FUNCTION PANEL
ALK PHOS: 57 U/L (ref 39–117)
ALT: 33 U/L (ref 0–53)
AST: 31 U/L (ref 0–37)
Albumin: 3.4 g/dL — ABNORMAL LOW (ref 3.5–5.2)
Bilirubin, Direct: 0.1 mg/dL (ref 0.0–0.3)
Total Bilirubin: 0.6 mg/dL (ref 0.2–1.2)
Total Protein: 6.3 g/dL (ref 6.0–8.3)

## 2014-01-21 LAB — BASIC METABOLIC PANEL
BUN: 16 mg/dL (ref 6–23)
CALCIUM: 11.1 mg/dL — AB (ref 8.4–10.5)
CO2: 29 mEq/L (ref 19–32)
Chloride: 109 mEq/L (ref 96–112)
Creatinine, Ser: 1.3 mg/dL (ref 0.4–1.5)
GFR: 71.46 mL/min (ref >60.00)
Glucose, Bld: 108 mg/dL — ABNORMAL HIGH (ref 70–99)
Potassium: 4.8 mEq/L (ref 3.5–5.1)
SODIUM: 142 meq/L (ref 135–145)

## 2014-01-21 LAB — CBC WITH DIFFERENTIAL/PLATELET
Basophils Absolute: 0 10*3/uL (ref 0.0–0.1)
Basophils Relative: 0.4 % (ref 0.0–3.0)
EOS PCT: 3.6 % (ref 0.0–5.0)
Eosinophils Absolute: 0.2 10*3/uL (ref 0.0–0.7)
HEMATOCRIT: 35.3 % — AB (ref 39.0–52.0)
HEMOGLOBIN: 11.5 g/dL — AB (ref 13.0–17.0)
LYMPHS ABS: 2.2 10*3/uL (ref 0.7–4.0)
LYMPHS PCT: 34.2 % (ref 12.0–46.0)
MCHC: 32.6 g/dL (ref 30.0–36.0)
MCV: 90.1 fl (ref 78.0–100.0)
MONOS PCT: 7.3 % (ref 3.0–12.0)
Monocytes Absolute: 0.5 10*3/uL (ref 0.1–1.0)
NEUTROS ABS: 3.5 10*3/uL (ref 1.4–7.7)
Neutrophils Relative %: 54.5 % (ref 43.0–77.0)
Platelets: 291 10*3/uL (ref 150.0–400.0)
RBC: 3.92 Mil/uL — ABNORMAL LOW (ref 4.22–5.81)
RDW: 15.2 % (ref 11.5–15.5)
WBC: 6.4 10*3/uL (ref 4.0–10.5)

## 2014-01-21 LAB — LIPID PANEL
CHOL/HDL RATIO: 2
CHOLESTEROL: 160 mg/dL (ref 0–200)
HDL: 74.8 mg/dL (ref >39.00)
LDL CALC: 70 mg/dL (ref 0–99)
NonHDL: 85.2
Triglycerides: 76 mg/dL (ref 0.0–149.0)
VLDL: 15.2 mg/dL (ref 0.0–40.0)

## 2014-01-21 LAB — PSA: PSA: 1.12 ng/mL (ref 0.10–4.00)

## 2014-01-21 LAB — TSH: TSH: 1.11 u[IU]/mL (ref 0.35–4.50)

## 2014-01-29 ENCOUNTER — Encounter: Payer: Self-pay | Admitting: Family Medicine

## 2014-01-29 ENCOUNTER — Ambulatory Visit (INDEPENDENT_AMBULATORY_CARE_PROVIDER_SITE_OTHER): Payer: Medicare Other | Admitting: Family Medicine

## 2014-01-29 VITALS — BP 148/85 | HR 91 | Temp 98.3°F | Ht 70.0 in | Wt 250.0 lb

## 2014-01-29 DIAGNOSIS — I10 Essential (primary) hypertension: Secondary | ICD-10-CM

## 2014-01-29 DIAGNOSIS — Z0000 Encounter for general adult medical examination without abnormal findings: Secondary | ICD-10-CM

## 2014-01-29 MED ORDER — METOPROLOL TARTRATE 50 MG PO TABS: 50.0000 mg | ORAL_TABLET | Freq: Two times a day (BID) | ORAL | Status: DC

## 2014-01-29 MED ORDER — BENAZEPRIL HCL 20 MG PO TABS: 20.0000 mg | ORAL_TABLET | Freq: Every day | ORAL | Status: DC

## 2014-01-29 MED ORDER — AMLODIPINE BESYLATE 10 MG PO TABS: ORAL_TABLET | ORAL | Status: DC

## 2014-01-29 MED ORDER — SERTRALINE HCL 25 MG PO TABS: 25.0000 mg | ORAL_TABLET | Freq: Every day | ORAL | Status: DC

## 2014-01-29 MED ORDER — TADALAFIL 5 MG PO TABS: ORAL_TABLET | ORAL | Status: DC

## 2014-01-29 MED ORDER — TAMSULOSIN HCL 0.4 MG PO CAPS: ORAL_CAPSULE | ORAL | Status: DC

## 2014-01-29 MED ORDER — OMEPRAZOLE 20 MG PO CPDR: 20.0000 mg | DELAYED_RELEASE_CAPSULE | Freq: Every day | ORAL | Status: DC

## 2014-01-29 MED ORDER — FERROUS SULFATE 325 (65 FE) MG PO TABS: ORAL_TABLET | ORAL | Status: DC

## 2014-01-29 MED ORDER — SIMVASTATIN 20 MG PO TABS: ORAL_TABLET | ORAL | Status: DC

## 2014-01-29 MED ORDER — ALENDRONATE SODIUM 70 MG PO TABS: ORAL_TABLET | ORAL | Status: DC

## 2014-01-29 NOTE — Progress Notes (Signed)
Pre visit review using our clinic review tool, if applicable. No additional management support is needed unless otherwise documented below in the visit note. 

## 2014-01-29 NOTE — Progress Notes (Signed)
   Subjective:    Patient ID: [name] [name], male    DOB: [birth year], 73 y.o.   MRN: [id number]  HPI 73 yr old male for a cpx. He is doing well except for chronic right knee pain. He is seeing Dr. Mayer Camel, who is recommending a knee replacement. Kue asks my opinion.    Review of Systems  Constitutional: Negative.   HENT: Negative.   Eyes: Negative.   Respiratory: Negative.   Cardiovascular: Negative.   Gastrointestinal: Negative.   Genitourinary: Negative.   Musculoskeletal: Positive for arthralgias and gait problem. Negative for myalgias, back pain, joint swelling, neck pain and neck stiffness.  Skin: Negative.   Neurological: Negative.   Psychiatric/Behavioral: Negative.        Objective:   Physical Exam  Constitutional: He is oriented to person, place, and time. He appears well-developed and well-nourished. No distress.  HENT:  Head: Normocephalic and atraumatic.  Right Ear: External ear normal.  Left Ear: External ear normal.  Nose: Nose normal.  Mouth/Throat: Oropharynx is clear and moist. No oropharyngeal exudate.  Eyes: Conjunctivae and EOM are normal. Pupils are equal, round, and reactive to light. Right eye exhibits no discharge. Left eye exhibits no discharge. No scleral icterus.  Neck: Neck supple. No JVD present. No tracheal deviation present. No thyromegaly present.  Cardiovascular: Normal rate, regular rhythm, normal heart sounds and intact distal pulses.  Exam reveals no gallop and no friction rub.   No murmur heard. EKG shows stable RBBB  Pulmonary/Chest: Effort normal and breath sounds normal. No respiratory distress. He has no wheezes. He has no rales. He exhibits no tenderness.  Abdominal: Soft. Bowel sounds are normal. He exhibits no distension and no mass. There is no tenderness. There is no rebound and no guarding.  Genitourinary: Rectum normal, prostate normal and penis normal. Guaiac negative stool. No penile tenderness.  Musculoskeletal: Normal range  of motion. He exhibits no edema or tenderness.  Lymphadenopathy:    He has no cervical adenopathy.  Neurological: He is alert and oriented to person, place, and time. He has normal reflexes. No cranial nerve deficit. He exhibits normal muscle tone. Coordination normal.  Skin: Skin is warm and dry. No rash noted. He is not diaphoretic. No erythema. No pallor.  Psychiatric: He has a normal mood and affect. His behavior is normal. Judgment and thought content normal.          Assessment & Plan:  Well exam. I advised him to get the knee replacement only when the pain is either unbearable or when it significantly limits his activities. Otherwise he is doing well.

## 2014-01-30 ENCOUNTER — Telehealth: Payer: Self-pay | Admitting: Family Medicine

## 2014-01-30 NOTE — Telephone Encounter (Signed)
emmi emailed °

## 2014-02-14 ENCOUNTER — Other Ambulatory Visit: Payer: Self-pay | Admitting: Family Medicine

## 2014-02-28 DIAGNOSIS — J301 Allergic rhinitis due to pollen: Secondary | ICD-10-CM | POA: Diagnosis not present

## 2014-02-28 DIAGNOSIS — J3089 Other allergic rhinitis: Secondary | ICD-10-CM | POA: Diagnosis not present

## 2014-03-04 DIAGNOSIS — G4733 Obstructive sleep apnea (adult) (pediatric): Secondary | ICD-10-CM | POA: Diagnosis not present

## 2014-03-05 DIAGNOSIS — J3089 Other allergic rhinitis: Secondary | ICD-10-CM | POA: Diagnosis not present

## 2014-03-05 DIAGNOSIS — J301 Allergic rhinitis due to pollen: Secondary | ICD-10-CM | POA: Diagnosis not present

## 2014-03-14 DIAGNOSIS — D124 Benign neoplasm of descending colon: Secondary | ICD-10-CM | POA: Diagnosis not present

## 2014-03-14 DIAGNOSIS — D122 Benign neoplasm of ascending colon: Secondary | ICD-10-CM | POA: Diagnosis not present

## 2014-03-14 DIAGNOSIS — K642 Third degree hemorrhoids: Secondary | ICD-10-CM | POA: Diagnosis not present

## 2014-03-14 DIAGNOSIS — D126 Benign neoplasm of colon, unspecified: Secondary | ICD-10-CM | POA: Diagnosis not present

## 2014-03-14 DIAGNOSIS — D125 Benign neoplasm of sigmoid colon: Secondary | ICD-10-CM | POA: Diagnosis not present

## 2014-03-14 DIAGNOSIS — Z09 Encounter for follow-up examination after completed treatment for conditions other than malignant neoplasm: Secondary | ICD-10-CM | POA: Diagnosis not present

## 2014-03-14 DIAGNOSIS — D123 Benign neoplasm of transverse colon: Secondary | ICD-10-CM | POA: Diagnosis not present

## 2014-03-14 DIAGNOSIS — Z86010 Personal history of colonic polyps: Secondary | ICD-10-CM | POA: Diagnosis not present

## 2014-03-14 DIAGNOSIS — K5730 Diverticulosis of large intestine without perforation or abscess without bleeding: Secondary | ICD-10-CM | POA: Diagnosis not present

## 2014-03-14 HISTORY — PX: COLONOSCOPY: SHX174

## 2014-03-19 DIAGNOSIS — J3089 Other allergic rhinitis: Secondary | ICD-10-CM | POA: Diagnosis not present

## 2014-03-19 DIAGNOSIS — J301 Allergic rhinitis due to pollen: Secondary | ICD-10-CM | POA: Diagnosis not present

## 2014-03-28 DIAGNOSIS — J301 Allergic rhinitis due to pollen: Secondary | ICD-10-CM | POA: Diagnosis not present

## 2014-03-28 DIAGNOSIS — J3089 Other allergic rhinitis: Secondary | ICD-10-CM | POA: Diagnosis not present

## 2014-04-04 DIAGNOSIS — J301 Allergic rhinitis due to pollen: Secondary | ICD-10-CM | POA: Diagnosis not present

## 2014-04-04 DIAGNOSIS — J3089 Other allergic rhinitis: Secondary | ICD-10-CM | POA: Diagnosis not present

## 2014-04-21 ENCOUNTER — Other Ambulatory Visit: Payer: Self-pay | Admitting: Family Medicine

## 2014-04-23 DIAGNOSIS — J3089 Other allergic rhinitis: Secondary | ICD-10-CM | POA: Diagnosis not present

## 2014-04-23 DIAGNOSIS — J301 Allergic rhinitis due to pollen: Secondary | ICD-10-CM | POA: Diagnosis not present

## 2014-05-01 ENCOUNTER — Other Ambulatory Visit: Payer: Self-pay | Admitting: Family Medicine

## 2014-05-09 DIAGNOSIS — J301 Allergic rhinitis due to pollen: Secondary | ICD-10-CM | POA: Diagnosis not present

## 2014-05-09 DIAGNOSIS — J3089 Other allergic rhinitis: Secondary | ICD-10-CM | POA: Diagnosis not present

## 2014-05-20 ENCOUNTER — Telehealth: Payer: Self-pay | Admitting: Family Medicine

## 2014-05-20 NOTE — Telephone Encounter (Signed)
Patient Name: [name] [name] DOB: [birth year] Initial Comment caller states he has rapid heart rate and dizziness Nurse Assessment Nurse: Vallery Sa, RN, [place] Date/Time (Eastern Time): 05/20/2014 1:53:33 PM Confirm and document reason for call. If symptomatic, describe symptoms. ---Caller states he developed a fast heart rate and dizziness a couple of days ago that is becoming worse today. He feels like he is having some breathing difficulty that is worse today. No blueness around his lips or face. Has the patient traveled out of the country within the last 30 days? ---No Does the patient require triage? ---Yes Related visit to physician within the last 2 weeks? ---No Does the PT have any chronic conditions? (i.e. diabetes, asthma, etc.) ---Yes List chronic conditions. ---High Blood Pressure, Allergies Guidelines Guideline Title Affirmed Question Affirmed Notes Breathing Difficulty [1] MODERATE difficulty breathing (e.g., speaks in phrases, SOB even at rest, pulse 100-120) AND [2] NEW-onset or WORSE than normal Final Disposition User Go to ED Now Vallery Sa, RN, [employer] states he may go to a closer ER.

## 2014-05-20 NOTE — Telephone Encounter (Signed)
Noted  

## 2014-05-21 DIAGNOSIS — J301 Allergic rhinitis due to pollen: Secondary | ICD-10-CM | POA: Diagnosis not present

## 2014-05-21 DIAGNOSIS — J3089 Other allergic rhinitis: Secondary | ICD-10-CM | POA: Diagnosis not present

## 2014-05-23 DIAGNOSIS — J3089 Other allergic rhinitis: Secondary | ICD-10-CM | POA: Diagnosis not present

## 2014-05-23 DIAGNOSIS — J301 Allergic rhinitis due to pollen: Secondary | ICD-10-CM | POA: Diagnosis not present

## 2014-05-28 DIAGNOSIS — J3089 Other allergic rhinitis: Secondary | ICD-10-CM | POA: Diagnosis not present

## 2014-05-28 DIAGNOSIS — J301 Allergic rhinitis due to pollen: Secondary | ICD-10-CM | POA: Diagnosis not present

## 2014-05-30 ENCOUNTER — Encounter: Payer: Self-pay | Admitting: Podiatry

## 2014-05-30 ENCOUNTER — Ambulatory Visit (INDEPENDENT_AMBULATORY_CARE_PROVIDER_SITE_OTHER): Payer: Medicare Other | Admitting: Podiatry

## 2014-05-30 VITALS — BP 134/81 | HR 75 | Resp 18 | Ht 72.0 in | Wt 239.0 lb

## 2014-05-30 DIAGNOSIS — L84 Corns and callosities: Secondary | ICD-10-CM

## 2014-05-30 DIAGNOSIS — M79676 Pain in unspecified toe(s): Secondary | ICD-10-CM | POA: Diagnosis not present

## 2014-05-30 DIAGNOSIS — B351 Tinea unguium: Secondary | ICD-10-CM | POA: Diagnosis not present

## 2014-05-30 NOTE — Patient Instructions (Signed)

## 2014-05-30 NOTE — Progress Notes (Signed)
   Subjective:    Patient ID: [name] [name], male    DOB: July 30, 1940, 74 y.o.   MRN: [id number]  HPI 74 year old male presents the office today with complaint of painful, thick, elongated toenails which he is unable to trim himself. He is inquiring about possible toenail fungus treatments. He denies any surrounding redness or drainage along the nail sites. Also states he has painful calluses on the bottoms of his feet mostly on the right which are painful particularly with pressure and weightbearing. Denies any redness or drainage on the sites. No other complaints at this time.   Review of Systems  HENT: Positive for sinus pressure and sneezing.   Eyes: Positive for itching.  Musculoskeletal: Positive for gait problem.  Allergic/Immunologic: Positive for environmental allergies.  All other systems reviewed and are negative.      Objective:   Physical Exam AAO x3, NAD DP/PT pulses palpable bilaterally, CRT less than 3 seconds Protective sensation intact with Simms Weinstein monofilament, vibratory sensation intact, Achilles tendon reflex intact Nails are hypertrophic, dystrophic, elongated, brittle, discolored 10. There is subjective tenderness along nails 1-5 bilaterally. There is no swelling erythema or drainage on the nail sites. There is multiple hyperkeratotic lesions along the plantar forefoot. On the right side submetatarsal 1, 3, 5 and on the right submetatarsal 5. There is tenderness directly upon palpation of these lesions. Upon debridement underlying skin is intact and there is no strong erythema or drainage. No other areas of tenderness to bilateral lower extremities. MMT 5/5, ROM WNL.  No open lesions or pre-ulcerative lesions.  No overlying edema, erythema, increase in warmth to bilateral lower extremities.  No pain with calf compression, swelling, warmth, erythema bilaterally.       Assessment & Plan:  74 year old male with symptomatic onychomycosis, hyperkeratotic  lesions -Treatment options discussed including alternatives, risks, complications -Nail sharply debrided 10 without complication/bleeding -Hyperkeratotic lesion sharply debrided 4 without complication/bleeding -Discussed treatment options for onychomycosis. At this time he elected to proceed with over-the-counter treatment I discussed fungi-nail. -Discussed shoe gear modifications and orthotics. -Follow-up in 3 months or sooner if any problems are to arise. In the meantime encouraged to call the office with any questions or concerns.

## 2014-05-31 ENCOUNTER — Encounter: Payer: Self-pay | Admitting: Podiatry

## 2014-06-03 ENCOUNTER — Ambulatory Visit (INDEPENDENT_AMBULATORY_CARE_PROVIDER_SITE_OTHER)
Admission: RE | Admit: 2014-06-03 | Discharge: 2014-06-03 | Disposition: A | Discharge status: Home / Self Care | Payer: Self-pay | Source: Ambulatory Visit | Attending: Family Medicine | Admitting: Family Medicine

## 2014-06-03 DIAGNOSIS — I10 Essential (primary) hypertension: Secondary | ICD-10-CM

## 2014-06-07 DIAGNOSIS — J3089 Other allergic rhinitis: Secondary | ICD-10-CM | POA: Diagnosis not present

## 2014-06-07 DIAGNOSIS — J301 Allergic rhinitis due to pollen: Secondary | ICD-10-CM | POA: Diagnosis not present

## 2014-06-11 DIAGNOSIS — G4733 Obstructive sleep apnea (adult) (pediatric): Secondary | ICD-10-CM | POA: Diagnosis not present

## 2014-06-14 DIAGNOSIS — J301 Allergic rhinitis due to pollen: Secondary | ICD-10-CM | POA: Diagnosis not present

## 2014-06-14 DIAGNOSIS — J3089 Other allergic rhinitis: Secondary | ICD-10-CM | POA: Diagnosis not present

## 2014-06-28 DIAGNOSIS — J3089 Other allergic rhinitis: Secondary | ICD-10-CM | POA: Diagnosis not present

## 2014-06-28 DIAGNOSIS — J301 Allergic rhinitis due to pollen: Secondary | ICD-10-CM | POA: Diagnosis not present

## 2014-07-05 DIAGNOSIS — J3089 Other allergic rhinitis: Secondary | ICD-10-CM | POA: Diagnosis not present

## 2014-07-05 DIAGNOSIS — J301 Allergic rhinitis due to pollen: Secondary | ICD-10-CM | POA: Diagnosis not present

## 2014-07-18 DIAGNOSIS — J301 Allergic rhinitis due to pollen: Secondary | ICD-10-CM | POA: Diagnosis not present

## 2014-07-18 DIAGNOSIS — J3089 Other allergic rhinitis: Secondary | ICD-10-CM | POA: Diagnosis not present

## 2014-07-26 DIAGNOSIS — J301 Allergic rhinitis due to pollen: Secondary | ICD-10-CM | POA: Diagnosis not present

## 2014-07-26 DIAGNOSIS — J3089 Other allergic rhinitis: Secondary | ICD-10-CM | POA: Diagnosis not present

## 2014-08-01 DIAGNOSIS — J3089 Other allergic rhinitis: Secondary | ICD-10-CM | POA: Diagnosis not present

## 2014-08-01 DIAGNOSIS — J301 Allergic rhinitis due to pollen: Secondary | ICD-10-CM | POA: Diagnosis not present

## 2014-08-29 ENCOUNTER — Ambulatory Visit (INDEPENDENT_AMBULATORY_CARE_PROVIDER_SITE_OTHER): Payer: Medicare Other | Admitting: Podiatry

## 2014-08-29 DIAGNOSIS — B351 Tinea unguium: Secondary | ICD-10-CM

## 2014-08-29 DIAGNOSIS — H1045 Other chronic allergic conjunctivitis: Secondary | ICD-10-CM | POA: Diagnosis not present

## 2014-08-29 DIAGNOSIS — J3089 Other allergic rhinitis: Secondary | ICD-10-CM | POA: Diagnosis not present

## 2014-08-29 DIAGNOSIS — M79676 Pain in unspecified toe(s): Secondary | ICD-10-CM | POA: Diagnosis not present

## 2014-08-29 DIAGNOSIS — J301 Allergic rhinitis due to pollen: Secondary | ICD-10-CM | POA: Diagnosis not present

## 2014-08-29 NOTE — Progress Notes (Signed)
Subjective: 74 y.o. male returns the office today for painful, elongated, thickened toenails for which he is unable to trim himself. Denies any redness or drainage around the nails. Denies any acute changes since last appointment and no new complaints today. Denies any systemic complaints such as fevers, chills, nausea, vomiting.   Objective: AAO 3, NAD DP/PT pulses palpable, CRT less than 3 seconds Protective sensation intact with Simms Weinstein monofilament, Achilles tendon reflex intact.  Nails hypertrophic, dystrophic, elongated, brittle, discolored 10. There is tenderness overlying the nails 1-5 bilaterally. There is no surrounding erythema or drainage along the nail sites. There are hyperkerotic lesions right submetatarsal 1, 3, 5 and right submetatarsal 5. Upon debridement, no underlying ulceration, drainage, erythema.  No open lesions or pre-ulcerative lesions are identified. No other areas of tenderness bilateral lower extremities. No overlying edema, erythema, increased warmth. No pain with calf compression, swelling, warmth, erythema.  Assessment: Patient presents with symptomatic onychomycosis  Plan: -Treatment options including alternatives, risks, complications were discussed -Nails sharply debrided 10 without complication/bleeding. -Hyperkerotic lesions sharply debrided without complications/bleeding -Dispensed metatarsal pads -Discussed daily foot inspection. If there are any changes, to call the office immediately.  -Follow-up in 3 months or sooner if any problems are to arise. In the meantime, encouraged to call the office with any questions, concerns, changes symptoms.   Celesta Gentile, DPM

## 2014-09-06 DIAGNOSIS — J3089 Other allergic rhinitis: Secondary | ICD-10-CM | POA: Diagnosis not present

## 2014-09-06 DIAGNOSIS — J301 Allergic rhinitis due to pollen: Secondary | ICD-10-CM | POA: Diagnosis not present

## 2014-09-09 DIAGNOSIS — M170 Bilateral primary osteoarthritis of knee: Secondary | ICD-10-CM | POA: Diagnosis not present

## 2014-09-09 DIAGNOSIS — R262 Difficulty in walking, not elsewhere classified: Secondary | ICD-10-CM | POA: Diagnosis not present

## 2014-09-12 DIAGNOSIS — J301 Allergic rhinitis due to pollen: Secondary | ICD-10-CM | POA: Diagnosis not present

## 2014-09-12 DIAGNOSIS — J3089 Other allergic rhinitis: Secondary | ICD-10-CM | POA: Diagnosis not present

## 2014-09-17 DIAGNOSIS — J301 Allergic rhinitis due to pollen: Secondary | ICD-10-CM | POA: Diagnosis not present

## 2014-09-17 DIAGNOSIS — J3089 Other allergic rhinitis: Secondary | ICD-10-CM | POA: Diagnosis not present

## 2014-09-19 DIAGNOSIS — J3089 Other allergic rhinitis: Secondary | ICD-10-CM | POA: Diagnosis not present

## 2014-09-19 DIAGNOSIS — J301 Allergic rhinitis due to pollen: Secondary | ICD-10-CM | POA: Diagnosis not present

## 2014-09-27 DIAGNOSIS — J3089 Other allergic rhinitis: Secondary | ICD-10-CM | POA: Diagnosis not present

## 2014-09-27 DIAGNOSIS — J301 Allergic rhinitis due to pollen: Secondary | ICD-10-CM | POA: Diagnosis not present

## 2014-10-03 DIAGNOSIS — J301 Allergic rhinitis due to pollen: Secondary | ICD-10-CM | POA: Diagnosis not present

## 2014-10-03 DIAGNOSIS — J3089 Other allergic rhinitis: Secondary | ICD-10-CM | POA: Diagnosis not present

## 2014-10-03 DIAGNOSIS — M170 Bilateral primary osteoarthritis of knee: Secondary | ICD-10-CM | POA: Diagnosis not present

## 2014-10-11 DIAGNOSIS — J301 Allergic rhinitis due to pollen: Secondary | ICD-10-CM | POA: Diagnosis not present

## 2014-10-11 DIAGNOSIS — J3089 Other allergic rhinitis: Secondary | ICD-10-CM | POA: Diagnosis not present

## 2014-10-22 DIAGNOSIS — J3089 Other allergic rhinitis: Secondary | ICD-10-CM | POA: Diagnosis not present

## 2014-10-22 DIAGNOSIS — J301 Allergic rhinitis due to pollen: Secondary | ICD-10-CM | POA: Diagnosis not present

## 2014-10-31 DIAGNOSIS — J301 Allergic rhinitis due to pollen: Secondary | ICD-10-CM | POA: Diagnosis not present

## 2014-10-31 DIAGNOSIS — J3089 Other allergic rhinitis: Secondary | ICD-10-CM | POA: Diagnosis not present

## 2014-11-07 DIAGNOSIS — J301 Allergic rhinitis due to pollen: Secondary | ICD-10-CM | POA: Diagnosis not present

## 2014-11-07 DIAGNOSIS — J3089 Other allergic rhinitis: Secondary | ICD-10-CM | POA: Diagnosis not present

## 2014-11-14 DIAGNOSIS — J3089 Other allergic rhinitis: Secondary | ICD-10-CM | POA: Diagnosis not present

## 2014-11-14 DIAGNOSIS — J301 Allergic rhinitis due to pollen: Secondary | ICD-10-CM | POA: Diagnosis not present

## 2014-11-19 ENCOUNTER — Other Ambulatory Visit: Payer: Self-pay | Admitting: Orthopedic Surgery

## 2014-11-19 DIAGNOSIS — J301 Allergic rhinitis due to pollen: Secondary | ICD-10-CM | POA: Diagnosis not present

## 2014-11-19 DIAGNOSIS — J3089 Other allergic rhinitis: Secondary | ICD-10-CM | POA: Diagnosis not present

## 2014-11-21 ENCOUNTER — Telehealth: Payer: Self-pay | Admitting: Family Medicine

## 2014-11-21 NOTE — Telephone Encounter (Signed)
Pt following up on prior authorization [facility]is. [facility]

## 2014-11-28 DIAGNOSIS — J3089 Other allergic rhinitis: Secondary | ICD-10-CM | POA: Diagnosis not present

## 2014-11-28 DIAGNOSIS — J301 Allergic rhinitis due to pollen: Secondary | ICD-10-CM | POA: Diagnosis not present

## 2014-12-06 ENCOUNTER — Ambulatory Visit (INDEPENDENT_AMBULATORY_CARE_PROVIDER_SITE_OTHER): Payer: Medicare Other | Admitting: Sports Medicine

## 2014-12-06 ENCOUNTER — Encounter: Payer: Self-pay | Admitting: Sports Medicine

## 2014-12-06 DIAGNOSIS — M79676 Pain in unspecified toe(s): Secondary | ICD-10-CM | POA: Diagnosis not present

## 2014-12-06 DIAGNOSIS — B351 Tinea unguium: Secondary | ICD-10-CM | POA: Diagnosis not present

## 2014-12-06 DIAGNOSIS — L84 Corns and callosities: Secondary | ICD-10-CM | POA: Diagnosis not present

## 2014-12-06 DIAGNOSIS — J3089 Other allergic rhinitis: Secondary | ICD-10-CM | POA: Diagnosis not present

## 2014-12-06 DIAGNOSIS — J301 Allergic rhinitis due to pollen: Secondary | ICD-10-CM | POA: Diagnosis not present

## 2014-12-06 NOTE — Progress Notes (Signed)
Patient ID: Lorraine Terriquez Pound, male   DOB: 12/22/1940, 74 y.o.   MRN: [id number] Subjective: Danile Trier Dittmer is a 74 y.o. male patient seen today in office with complaint of thickened and elongated toenails; unable to trim. Patient denies history of Diabetes, Neuropathy, or Vascular disease. Patient has no other pedal complaints at this time.  Patient Active Problem List   Diagnosis Date Noted  . OSA (obstructive sleep apnea) 05/27/2011  . PRIMARY HYPERPARATHYROIDISM 12/25/2009  . KNEE PAIN, LEFT 12/25/2009  . PLANTAR FASCIITIS 12/16/2009  . ACUTE PROSTATITIS 01/14/2009  . ANEMIA-IRON DEFICIENCY 01/09/2008  . ACUTE SINUSITIS, UNSPECIFIED 07/31/2007  . HYPERLIPIDEMIA 12/16/2006  . ANXIETY 12/16/2006  . ERECTILE DYSFUNCTION 12/16/2006  . HYPERTENSION 12/16/2006  . ALLERGIC RHINITIS 12/16/2006  . ASTHMA 12/16/2006  . GERD 12/16/2006  . COLONIC POLYPS, HX OF 12/16/2006  . BENIGN PROSTATIC HYPERTROPHY, HX OF 12/16/2006  . PARONYCHIA, FINGER 10/11/2006    Current Outpatient Prescriptions on File Prior to Visit  Medication Sig Dispense Refill  . alendronate (FOSAMAX) 70 MG tablet TAKE 1 TABLET BY MOUTH EVERY 7 DAYS. TAKE WITH FULL GLASS OF WATER ON AN EMPTY STOMACH 12 tablet 3  . amLODipine (NORVASC) 10 MG tablet TAKE 1 TABLET (10 MG TOTAL) BY MOUTH DAILY. 90 tablet 3  . aspirin 81 MG tablet Take 81 mg by mouth as needed.     . benazepril (LOTENSIN) 20 MG tablet Take 1 tablet (20 mg total) by mouth daily. 90 tablet 3  . cetirizine (ZYRTEC) 10 MG tablet Take 10 mg by mouth daily.     . ferrous sulfate 325 (65 FE) MG tablet TAKE 1 TABLET BY MOUTH 2 TIMES DAILY WITH A MEAL. 180 tablet 3  . fish oil-omega-3 fatty acids 1000 MG capsule Take 2 g by mouth daily.      . metoprolol (LOPRESSOR) 50 MG tablet Take 1 tablet (50 mg total) by mouth 2 (two) times daily. 180 tablet 3  . Multiple Vitamin (MULTIVITAMIN) tablet Take 1 tablet by mouth daily.    Marland Kitchen omeprazole (PRILOSEC) 20 MG capsule Take 1 capsule  (20 mg total) by mouth daily. 90 capsule 3  . sertraline (ZOLOFT) 25 MG tablet Take 1 tablet (25 mg total) by mouth daily. 90 tablet 3  . simvastatin (ZOCOR) 20 MG tablet TAKE 1 TABLET (20 MG TOTAL) BY MOUTH DAILY. 90 tablet 3  . tadalafil (CIALIS) 5 MG tablet Once daily for BPH 90 tablet 3  . tamsulosin (FLOMAX) 0.4 MG CAPS capsule TAKE 1 CAPSULE (0.4 MG TOTAL) BY MOUTH DAILY. 90 capsule 1  . [DISCONTINUED] amLODipine-benazepril (LOTREL) 10-20 MG per capsule Take 1 capsule by mouth daily. 30 capsule 11   No current facility-administered medications on file prior to visit.   No Known Allergies   Objective: Physical Exam  General: Well developed, nourished, no acute distress, awake, alert and oriented x 3  Vascular: Dorsalis pedis artery 2/4 bilateral, Posterior tibial artery 2/4 bilateral, skin temperature warm to warm proximal to distal bilateral lower extremities, no varicosities, pedal hair present bilateral.  Neurological: Epicritic sensation intact via 5.07 Semmes Weinstein at all pedal sites, vibratory sensation intact bilateral.  Dermatological: Skin is warm, dry, and supple bilateral, Nails 1-10 are tender, long, thick, and discolored with mild subungal debris, no webspace macerations present bilateral, no open lesions present bilateral, Hyperkeratotic tissue present sub 1st & 5th metatarsal heads on right and sub 3rd metatarsal on left with no signs of infection bilateral.  Musculoskeletal: Asymptomatic bunion and hammertes  bilateral. Muscular strength within normal limits without pain or limitation on range of motion. No pain with calf compression bilateral.  Assessment and Plan:  Problem List Items Addressed This Visit    None    Visit Diagnoses    Dermatophytosis of nail    -  Primary    Corns and callosities        Sub 1 & 5 on right, sub 3 on left    Pain of toe, unspecified laterality          -Examination performed. -Discussed treatment options for painful mycotic  nails & callus skin. -Mechanically debrided and reduced mycotic nails with sterile nail nipper and dremel nail file without incident. -Debrided callouses x 3 using sterile chisel blade without incident. -Recommend skin emollient for dry calloused skin.  -Recommend cont. With good supportive shoes daily -Patient to return in 3 months for follow up evaluation or sooner if symptoms worsen.  Landis Martins, DPM

## 2014-12-09 ENCOUNTER — Ambulatory Visit: Payer: Medicare Other

## 2014-12-10 DIAGNOSIS — J301 Allergic rhinitis due to pollen: Secondary | ICD-10-CM | POA: Diagnosis not present

## 2014-12-10 DIAGNOSIS — J3089 Other allergic rhinitis: Secondary | ICD-10-CM | POA: Diagnosis not present

## 2014-12-12 NOTE — Pre-Procedure Instructions (Signed)
    [place]  12/12/2014      [id number] [place] [place] [address] [place] [address] Phone: [phone] Fax: [phone]    Your procedure is scheduled on 12/23/14.  Report to [facility] Admitting at 7 A.M.  Call this number if you have problems the morning of surgery:  [phone]   Remember:  Do not eat food or drink liquids after midnight.  Take these medicines the morning of surgery with A SIP OF WATER--norvasc,zyrtec,synthroid,metoprolol,singular,flomax,prilosec   Do not wear jewelry, make-up or nail polish.  Do not wear lotions, powders, or perfumes.  You may wear deodorant.  Do not shave 48 hours prior to surgery.  Men may shave face and neck.  Do not bring valuables to the hospital.  [facility] is not responsible for any belongings or valuables.  Contacts, dentures or bridgework may not be worn into surgery.  Leave your suitcase in the car.  After surgery it may be brought to your room.  For patients admitted to the hospital, discharge time will be determined by your treatment team.  Patients discharged the day of surgery will not be allowed to drive home.   Name and phone number of your driver:   Special instructions:    Please read over the following fact sheets that you were given. Pain Booklet, Coughing and Deep Breathing, Blood Transfusion Information, MRSA Information and Surgical Site Infection Prevention

## 2014-12-13 ENCOUNTER — Encounter (HOSPITAL_COMMUNITY)
Admission: RE | Admit: 2014-12-13 | Discharge: 2014-12-13 | Disposition: A | Discharge status: Home / Self Care | Payer: Medicare Other | Source: Ambulatory Visit | Attending: Orthopedic Surgery | Admitting: Orthopedic Surgery

## 2014-12-13 ENCOUNTER — Encounter (HOSPITAL_COMMUNITY): Payer: Self-pay

## 2014-12-13 DIAGNOSIS — M179 Osteoarthritis of knee, unspecified: Secondary | ICD-10-CM | POA: Diagnosis not present

## 2014-12-13 DIAGNOSIS — Z7982 Long term (current) use of aspirin: Secondary | ICD-10-CM | POA: Diagnosis not present

## 2014-12-13 DIAGNOSIS — K219 Gastro-esophageal reflux disease without esophagitis: Secondary | ICD-10-CM | POA: Diagnosis not present

## 2014-12-13 DIAGNOSIS — Z0183 Encounter for blood typing: Secondary | ICD-10-CM | POA: Insufficient documentation

## 2014-12-13 DIAGNOSIS — I10 Essential (primary) hypertension: Secondary | ICD-10-CM | POA: Insufficient documentation

## 2014-12-13 DIAGNOSIS — J45909 Unspecified asthma, uncomplicated: Secondary | ICD-10-CM | POA: Insufficient documentation

## 2014-12-13 DIAGNOSIS — Z01812 Encounter for preprocedural laboratory examination: Secondary | ICD-10-CM | POA: Diagnosis not present

## 2014-12-13 DIAGNOSIS — Z87891 Personal history of nicotine dependence: Secondary | ICD-10-CM | POA: Insufficient documentation

## 2014-12-13 DIAGNOSIS — Z01818 Encounter for other preprocedural examination: Secondary | ICD-10-CM | POA: Diagnosis not present

## 2014-12-13 DIAGNOSIS — E785 Hyperlipidemia, unspecified: Secondary | ICD-10-CM | POA: Diagnosis not present

## 2014-12-13 DIAGNOSIS — D649 Anemia, unspecified: Secondary | ICD-10-CM | POA: Insufficient documentation

## 2014-12-13 DIAGNOSIS — G4733 Obstructive sleep apnea (adult) (pediatric): Secondary | ICD-10-CM | POA: Insufficient documentation

## 2014-12-13 DIAGNOSIS — Z79899 Other long term (current) drug therapy: Secondary | ICD-10-CM | POA: Insufficient documentation

## 2014-12-13 HISTORY — DX: Unspecified osteoarthritis, unspecified site: M19.90

## 2014-12-13 HISTORY — DX: Depression, unspecified: F32.A

## 2014-12-13 HISTORY — DX: Sleep apnea, unspecified: G47.30

## 2014-12-13 HISTORY — DX: Major depressive disorder, single episode, unspecified: F32.9

## 2014-12-13 LAB — URINE MICROSCOPIC-ADD ON

## 2014-12-13 LAB — URINALYSIS, ROUTINE W REFLEX MICROSCOPIC
Bilirubin Urine: NEGATIVE
Glucose, UA: NEGATIVE mg/dL
KETONES UR: NEGATIVE mg/dL
NITRITE: NEGATIVE
PROTEIN: NEGATIVE mg/dL
Specific Gravity, Urine: 1.018 (ref 1.005–1.030)
Urobilinogen, UA: 0.2 mg/dL (ref 0.0–1.0)
pH: 6 (ref 5.0–8.0)

## 2014-12-13 LAB — TYPE AND SCREEN
ABO/RH(D): A POS
Antibody Screen: NEGATIVE

## 2014-12-13 LAB — PROTIME-INR
INR: 1.02 (ref 0.00–1.49)
Prothrombin Time: 13.6 seconds (ref 11.6–15.2)

## 2014-12-13 LAB — SURGICAL PCR SCREEN
MRSA, PCR: NEGATIVE
STAPHYLOCOCCUS AUREUS: POSITIVE — AB

## 2014-12-13 LAB — CBC WITH DIFFERENTIAL/PLATELET
BASOS PCT: 0 %
Basophils Absolute: 0 10*3/uL (ref 0.0–0.1)
EOS PCT: 5 %
Eosinophils Absolute: 0.4 10*3/uL (ref 0.0–0.7)
HEMATOCRIT: 36.5 % — AB (ref 39.0–52.0)
Hemoglobin: 11.7 g/dL — ABNORMAL LOW (ref 13.0–17.0)
Lymphocytes Relative: 40 %
Lymphs Abs: 2.7 10*3/uL (ref 0.7–4.0)
MCH: 29.5 pg (ref 26.0–34.0)
MCHC: 32.1 g/dL (ref 30.0–36.0)
MCV: 91.9 fL (ref 78.0–100.0)
MONOS PCT: 8 %
Monocytes Absolute: 0.5 10*3/uL (ref 0.1–1.0)
NEUTROS ABS: 3.2 10*3/uL (ref 1.7–7.7)
Neutrophils Relative %: 47 %
Platelets: 279 10*3/uL (ref 150–400)
RBC: 3.97 MIL/uL — ABNORMAL LOW (ref 4.22–5.81)
RDW: 14.9 % (ref 11.5–15.5)
WBC: 6.8 10*3/uL (ref 4.0–10.5)

## 2014-12-13 LAB — ABO/RH: ABO/RH(D): A POS

## 2014-12-13 LAB — APTT: aPTT: 31 seconds (ref 24–37)

## 2014-12-16 NOTE — Progress Notes (Signed)
Anesthesia Chart Review:  Pt is 74 year old male scheduled for L total knee arthroplasty on 12/23/2014 with Dr. Mayer Camel.   PCP is Dr. Alysia Penna, last office visit 01/29/14  PMH includes: HTN, RBBB, hyperlipidemia, anemia, OSA, asthma, GERD. Former smoker. BMI 33.  Medications include: amlodipine, ASA, benazepril, iron, levothyroxine, metoprolol, olanzapine, prilosec, cialis.   Preoperative labs reviewed.    Chest x-ray 06/03/14 reviewed. No active cardiopulmonary disease.   EKG 01/29/2014: sinus rhythm. RBBB.   If no changes, I anticipate pt can proceed with surgery as scheduled.   Willeen Cass, FNP-BC [facility]/Anesthesiology Phone: [phone] 12/16/2014 2:15 PM

## 2014-12-17 DIAGNOSIS — J301 Allergic rhinitis due to pollen: Secondary | ICD-10-CM | POA: Diagnosis not present

## 2014-12-17 DIAGNOSIS — J3089 Other allergic rhinitis: Secondary | ICD-10-CM | POA: Diagnosis not present

## 2014-12-20 ENCOUNTER — Encounter (HOSPITAL_COMMUNITY): Payer: Self-pay | Admitting: Certified Registered Nurse Anesthetist

## 2014-12-20 NOTE — H&P (Signed)
TOTAL KNEE ADMISSION H&P  Patient is being admitted for left total knee arthroplasty.  Subjective:  Chief Complaint:left knee pain.  HPI: [name] [name], 74 y.o. male, has a history of pain and functional disability in the left knee due to arthritis and has failed non-surgical conservative treatments for greater than 12 weeks to includeNSAID's and/or analgesics, corticosteriod injections, viscosupplementation injections, flexibility and strengthening excercises and activity modification.  Onset of symptoms was gradual, starting 2 years ago with gradually worsening course since that time. The patient noted no past surgery on the left knee(s).  Patient currently rates pain in the left knee(s) at 10 out of 10 with activity. Patient has night pain, worsening of pain with activity and weight bearing, pain that interferes with activities of daily living, pain with passive range of motion, crepitus and joint swelling.  Patient has evidence of subchondral sclerosis and joint space narrowing by imaging studies.  There is no active infection.  Patient Active Problem List   Diagnosis Date Noted  . OSA (obstructive sleep apnea) 05/27/2011  . PRIMARY HYPERPARATHYROIDISM 12/25/2009  . KNEE PAIN, LEFT 12/25/2009  . PLANTAR FASCIITIS 12/16/2009  . ACUTE PROSTATITIS 01/14/2009  . ANEMIA-IRON DEFICIENCY 01/09/2008  . ACUTE SINUSITIS, UNSPECIFIED 07/31/2007  . HYPERLIPIDEMIA 12/16/2006  . ANXIETY 12/16/2006  . ERECTILE DYSFUNCTION 12/16/2006  . HYPERTENSION 12/16/2006  . ALLERGIC RHINITIS 12/16/2006  . ASTHMA 12/16/2006  . GERD 12/16/2006  . COLONIC POLYPS, HX OF 12/16/2006  . BENIGN PROSTATIC HYPERTROPHY, HX OF 12/16/2006  . PARONYCHIA, FINGER 10/11/2006   Past Medical History  Diagnosis Date  . Anxiety   . Asthma   . GERD (gastroesophageal reflux disease)   . Hyperlipidemia   . Hypertension   . Anemia   . RBBB (right bundle branch block)     stable  . Colon polyp   . Allergy     sees Dr.  Donneta Romberg   . Osteoporosis 01-2010  . Arthritis   . Sleep apnea     cpap  . Depression     Past Surgical History  Procedure Laterality Date  . Elbow surgery      right  . Colonoscopy  02-24-09    per Dr. Earlean Shawl, polyps, repeat in 5 yrs    No prescriptions prior to admission   No Known Allergies  Social History  Substance Use Topics  . Smoking status: Former Smoker -- 1.00 packs/day for 27 years    Types: Cigarettes    Quit date: 02/15/1986  . Smokeless tobacco: Former Systems developer    Quit date: 08/06/1979  . Alcohol Use: 0.0 oz/week    0 Standard drinks or equivalent per week     Comment: occ      Family History  Problem Relation Age of Onset  . Cancer Mother     stomach     Review of Systems  Constitutional: Negative.   HENT: Negative.   Eyes:       Glasses  Respiratory: Positive for shortness of breath.   Cardiovascular:       HTN  Gastrointestinal: Negative.   Genitourinary: Negative.   Musculoskeletal: Positive for joint pain.  Skin: Negative.   Neurological: Negative.   Endo/Heme/Allergies: Negative.   Psychiatric/Behavioral: Positive for depression.    Objective:  Physical Exam  Constitutional: He is oriented to person, place, and time. He appears well-developed and well-nourished.  HENT:  Head: Normocephalic and atraumatic.  Neck: Normal range of motion. Neck supple.  Cardiovascular: Intact distal pulses.   Respiratory: Effort  normal.  Musculoskeletal: He exhibits tenderness.  Both knees have obvious valgus deformities, right is actually more than the left at about 10.  Collateral ligaments are stable he is nontender over the MCL tender along the lateral joint lines.  There is no flexion deformity at this time.  There is crepitus when you take him through range of motion.    Neurological: He is alert and oriented to person, place, and time.  Skin: Skin is warm and dry.  Psychiatric: He has a normal mood and affect. His behavior is normal. Judgment and  thought content normal.    Vital signs in last 24 hours:    Labs:   Estimated body mass index is 32.41 kg/(m^2) as calculated from the following:   Height as of 05/30/14: 6' (1.829 m).   Weight as of 05/30/14: 108.41 kg (239 lb).   Imaging Review Plain radiographs demonstrate bone-on-bone arthritic changes with valgus deformities.  Assessment/Plan:  End stage arthritis, left knee   The patient history, physical examination, clinical judgment of the provider and imaging studies are consistent with end stage degenerative joint disease of the left knee(s) and total knee arthroplasty is deemed medically necessary. The treatment options including medical management, injection therapy arthroscopy and arthroplasty were discussed at length. The risks and benefits of total knee arthroplasty were presented and reviewed. The risks due to aseptic loosening, infection, stiffness, patella tracking problems, thromboembolic complications and other imponderables were discussed. The patient acknowledged the explanation, agreed to proceed with the plan and consent was signed. Patient is being admitted for inpatient treatment for surgery, pain control, PT, OT, prophylactic antibiotics, VTE prophylaxis, progressive ambulation and ADL's and discharge planning. The patient is planning to be discharged home with home health services

## 2014-12-21 DIAGNOSIS — M1712 Unilateral primary osteoarthritis, left knee: Secondary | ICD-10-CM | POA: Diagnosis present

## 2014-12-22 MED ORDER — DEXTROSE 5 % IV SOLN
3.0000 g | INTRAVENOUS | Status: AC
  Administered 2014-12-23: 3 g via INTRAVENOUS
  Filled 2014-12-22: qty 3000

## 2014-12-22 MED ORDER — BUPIVACAINE LIPOSOME 1.3 % IJ SUSP
20.0000 mL | INTRAMUSCULAR | Status: AC
  Administered 2014-12-23: 20 mL
  Filled 2014-12-22 (×2): qty 20

## 2014-12-22 MED ORDER — TRANEXAMIC ACID 1000 MG/10ML IV SOLN
1000.0000 mg | INTRAVENOUS | Status: AC
  Administered 2014-12-23: 1000 mg via INTRAVENOUS
  Filled 2014-12-22: qty 10

## 2014-12-22 MED ORDER — BUPIVACAINE LIPOSOME 1.3 % IJ SUSP
20.0000 mL | Freq: Once | INTRAMUSCULAR | Status: DC
  Filled 2014-12-22: qty 20

## 2014-12-23 ENCOUNTER — Inpatient Hospital Stay (HOSPITAL_COMMUNITY)
Admission: RE | Admit: 2014-12-23 | Discharge: 2014-12-25 | DRG: 470 | Disposition: A | Discharge status: Home Health | Payer: Medicare Other | Source: Ambulatory Visit | Attending: Orthopedic Surgery | Admitting: Orthopedic Surgery

## 2014-12-23 ENCOUNTER — Encounter (HOSPITAL_COMMUNITY)
Admission: RE | Disposition: A | Discharge status: Home Health | Payer: Self-pay | Source: Ambulatory Visit | Attending: Orthopedic Surgery

## 2014-12-23 ENCOUNTER — Inpatient Hospital Stay (HOSPITAL_COMMUNITY): Payer: Medicare Other | Admitting: Emergency Medicine

## 2014-12-23 ENCOUNTER — Inpatient Hospital Stay (HOSPITAL_COMMUNITY): Payer: Medicare Other | Admitting: Certified Registered Nurse Anesthetist

## 2014-12-23 ENCOUNTER — Encounter (HOSPITAL_COMMUNITY): Payer: Self-pay | Admitting: General Practice

## 2014-12-23 DIAGNOSIS — M1712 Unilateral primary osteoarthritis, left knee: Secondary | ICD-10-CM | POA: Diagnosis not present

## 2014-12-23 DIAGNOSIS — G4733 Obstructive sleep apnea (adult) (pediatric): Secondary | ICD-10-CM | POA: Diagnosis present

## 2014-12-23 DIAGNOSIS — D62 Acute posthemorrhagic anemia: Secondary | ICD-10-CM | POA: Diagnosis not present

## 2014-12-23 DIAGNOSIS — Z791 Long term (current) use of non-steroidal anti-inflammatories (NSAID): Secondary | ICD-10-CM

## 2014-12-23 DIAGNOSIS — M25562 Pain in left knee: Secondary | ICD-10-CM | POA: Diagnosis present

## 2014-12-23 DIAGNOSIS — K219 Gastro-esophageal reflux disease without esophagitis: Secondary | ICD-10-CM | POA: Diagnosis not present

## 2014-12-23 DIAGNOSIS — I10 Essential (primary) hypertension: Secondary | ICD-10-CM | POA: Diagnosis present

## 2014-12-23 DIAGNOSIS — M179 Osteoarthritis of knee, unspecified: Secondary | ICD-10-CM | POA: Diagnosis not present

## 2014-12-23 DIAGNOSIS — M1710 Unilateral primary osteoarthritis, unspecified knee: Secondary | ICD-10-CM | POA: Diagnosis present

## 2014-12-23 DIAGNOSIS — Z7983 Long term (current) use of bisphosphonates: Secondary | ICD-10-CM

## 2014-12-23 DIAGNOSIS — N400 Enlarged prostate without lower urinary tract symptoms: Secondary | ICD-10-CM | POA: Diagnosis present

## 2014-12-23 DIAGNOSIS — Z7982 Long term (current) use of aspirin: Secondary | ICD-10-CM | POA: Diagnosis not present

## 2014-12-23 DIAGNOSIS — E785 Hyperlipidemia, unspecified: Secondary | ICD-10-CM | POA: Diagnosis present

## 2014-12-23 DIAGNOSIS — Z87891 Personal history of nicotine dependence: Secondary | ICD-10-CM | POA: Diagnosis not present

## 2014-12-23 DIAGNOSIS — Z96652 Presence of left artificial knee joint: Secondary | ICD-10-CM | POA: Diagnosis not present

## 2014-12-23 HISTORY — PX: TOTAL KNEE ARTHROPLASTY: SHX125

## 2014-12-23 LAB — BASIC METABOLIC PANEL
ANION GAP: 8 (ref 5–15)
BUN: 13 mg/dL (ref 6–20)
CO2: 23 mmol/L (ref 22–32)
Calcium: 11.1 mg/dL — ABNORMAL HIGH (ref 8.9–10.3)
Chloride: 108 mmol/L (ref 101–111)
Creatinine, Ser: 1.12 mg/dL (ref 0.61–1.24)
GFR calc Af Amer: >60 mL/min (ref >60)
GFR calc non Af Amer: >60 mL/min (ref >60)
GLUCOSE: 117 mg/dL — AB (ref 65–99)
POTASSIUM: 5.5 mmol/L — AB (ref 3.5–5.1)
Sodium: 139 mmol/L (ref 135–145)

## 2014-12-23 SURGERY — ARTHROPLASTY, KNEE, TOTAL
Anesthesia: Spinal | Laterality: Left

## 2014-12-23 MED ORDER — LEVOTHYROXINE SODIUM 50 MCG PO TABS
50.0000 ug | ORAL_TABLET | Freq: Every day | ORAL | Status: DC
  Administered 2014-12-24 – 2014-12-25 (×2): 50 ug via ORAL
  Filled 2014-12-23 (×2): qty 1

## 2014-12-23 MED ORDER — DOCUSATE SODIUM 100 MG PO CAPS
100.0000 mg | ORAL_CAPSULE | Freq: Two times a day (BID) | ORAL | Status: DC
  Administered 2014-12-23 – 2014-12-25 (×4): 100 mg via ORAL
  Filled 2014-12-23 (×4): qty 1

## 2014-12-23 MED ORDER — PHENYLEPHRINE HCL 10 MG/ML IJ SOLN
10.0000 mg | INTRAMUSCULAR | Status: DC | PRN
  Administered 2014-12-23: 20 ug/min via INTRAVENOUS

## 2014-12-23 MED ORDER — METHOCARBAMOL 500 MG PO TABS: 500.0000 mg | ORAL_TABLET | Freq: Four times a day (QID) | ORAL | Status: DC | PRN

## 2014-12-23 MED ORDER — MIDAZOLAM HCL 2 MG/2ML IJ SOLN
INTRAMUSCULAR | Status: AC
  Filled 2014-12-23: qty 4

## 2014-12-23 MED ORDER — ACETAMINOPHEN 325 MG PO TABS
650.0000 mg | ORAL_TABLET | Freq: Four times a day (QID) | ORAL | Status: DC | PRN
  Administered 2014-12-24 – 2014-12-25 (×2): 650 mg via ORAL
  Filled 2014-12-23 (×2): qty 2

## 2014-12-23 MED ORDER — METHOCARBAMOL 1000 MG/10ML IJ SOLN
500.0000 mg | Freq: Four times a day (QID) | INTRAVENOUS | Status: DC | PRN
  Filled 2014-12-23: qty 5

## 2014-12-23 MED ORDER — FLEET ENEMA 7-19 GM/118ML RE ENEM: 1.0000 | ENEMA | Freq: Once | RECTAL | Status: DC | PRN

## 2014-12-23 MED ORDER — OXYCODONE-ACETAMINOPHEN 5-325 MG PO TABS: 1.0000 | ORAL_TABLET | ORAL | Status: DC | PRN

## 2014-12-23 MED ORDER — HYDROMORPHONE HCL 1 MG/ML IJ SOLN
0.2500 mg | INTRAMUSCULAR | Status: DC | PRN
  Administered 2014-12-23 (×4): 0.5 mg via INTRAVENOUS

## 2014-12-23 MED ORDER — PROPOFOL 500 MG/50ML IV EMUL
INTRAVENOUS | Status: DC | PRN
  Administered 2014-12-23: 25 ug/kg/min via INTRAVENOUS

## 2014-12-23 MED ORDER — HYDROMORPHONE HCL 1 MG/ML IJ SOLN
INTRAMUSCULAR | Status: AC
  Filled 2014-12-23: qty 1

## 2014-12-23 MED ORDER — AMLODIPINE BESYLATE 10 MG PO TABS
10.0000 mg | ORAL_TABLET | Freq: Every day | ORAL | Status: DC
  Administered 2014-12-24 – 2014-12-25 (×2): 10 mg via ORAL
  Filled 2014-12-23 (×3): qty 1

## 2014-12-23 MED ORDER — METOCLOPRAMIDE HCL 5 MG/ML IJ SOLN: 5.0000 mg | Freq: Three times a day (TID) | INTRAMUSCULAR | Status: DC | PRN

## 2014-12-23 MED ORDER — TAMSULOSIN HCL 0.4 MG PO CAPS
0.4000 mg | ORAL_CAPSULE | Freq: Every day | ORAL | Status: DC
  Administered 2014-12-23 – 2014-12-24 (×2): 0.4 mg via ORAL
  Filled 2014-12-23 (×2): qty 1

## 2014-12-23 MED ORDER — CHLORHEXIDINE GLUCONATE 4 % EX LIQD: 60.0000 mL | Freq: Once | CUTANEOUS | Status: DC

## 2014-12-23 MED ORDER — ASPIRIN EC 325 MG PO TBEC
325.0000 mg | DELAYED_RELEASE_TABLET | Freq: Every day | ORAL | Status: DC
  Administered 2014-12-24 – 2014-12-25 (×2): 325 mg via ORAL
  Filled 2014-12-23 (×2): qty 1

## 2014-12-23 MED ORDER — CEFUROXIME SODIUM 1.5 G IJ SOLR
INTRAMUSCULAR | Status: AC
  Filled 2014-12-23: qty 1.5

## 2014-12-23 MED ORDER — PANTOPRAZOLE SODIUM 40 MG PO TBEC
40.0000 mg | DELAYED_RELEASE_TABLET | Freq: Every day | ORAL | Status: DC
  Administered 2014-12-24 – 2014-12-25 (×2): 40 mg via ORAL
  Filled 2014-12-23 (×2): qty 1

## 2014-12-23 MED ORDER — HYDROMORPHONE HCL 1 MG/ML IJ SOLN
1.0000 mg | INTRAMUSCULAR | Status: DC | PRN
  Administered 2014-12-23: 1 mg via INTRAVENOUS
  Filled 2014-12-23: qty 1

## 2014-12-23 MED ORDER — AMLODIPINE BESYLATE 10 MG PO TABS: 10.0000 mg | ORAL_TABLET | Freq: Every day | ORAL | Status: DC

## 2014-12-23 MED ORDER — OLANZAPINE 10 MG PO TABS
10.0000 mg | ORAL_TABLET | Freq: Every day | ORAL | Status: DC
  Administered 2014-12-24 – 2014-12-25 (×2): 10 mg via ORAL
  Filled 2014-12-23 (×3): qty 1

## 2014-12-23 MED ORDER — ONDANSETRON HCL 4 MG/2ML IJ SOLN: 4.0000 mg | Freq: Four times a day (QID) | INTRAMUSCULAR | Status: DC | PRN

## 2014-12-23 MED ORDER — MIRTAZAPINE 15 MG PO TABS
15.0000 mg | ORAL_TABLET | Freq: Every day | ORAL | Status: DC
  Administered 2014-12-23 – 2014-12-24 (×2): 15 mg via ORAL
  Filled 2014-12-23 (×2): qty 1

## 2014-12-23 MED ORDER — PHENOL 1.4 % MT LIQD: 1.0000 | OROMUCOSAL | Status: DC | PRN

## 2014-12-23 MED ORDER — ONDANSETRON HCL 4 MG PO TABS: 4.0000 mg | ORAL_TABLET | Freq: Four times a day (QID) | ORAL | Status: DC | PRN

## 2014-12-23 MED ORDER — CEFUROXIME SODIUM 1.5 G IJ SOLR
INTRAMUSCULAR | Status: DC | PRN
  Administered 2014-12-23: 1.5 g

## 2014-12-23 MED ORDER — SODIUM CHLORIDE 0.9 % IR SOLN
Status: DC | PRN
  Administered 2014-12-23: 1000 mL

## 2014-12-23 MED ORDER — KCL IN DEXTROSE-NACL 20-5-0.45 MEQ/L-%-% IV SOLN
INTRAVENOUS | Status: DC
  Administered 2014-12-23: 21:00:00 via INTRAVENOUS
  Filled 2014-12-23: qty 1000

## 2014-12-23 MED ORDER — TIZANIDINE HCL 2 MG PO TABS: 2.0000 mg | ORAL_TABLET | Freq: Four times a day (QID) | ORAL | Status: DC | PRN

## 2014-12-23 MED ORDER — MONTELUKAST SODIUM 10 MG PO TABS
10.0000 mg | ORAL_TABLET | Freq: Every evening | ORAL | Status: DC
  Administered 2014-12-24: 10 mg via ORAL
  Filled 2014-12-23: qty 1

## 2014-12-23 MED ORDER — LACTATED RINGERS IV SOLN
INTRAVENOUS | Status: DC | PRN
  Administered 2014-12-23: 10:00:00 via INTRAVENOUS

## 2014-12-23 MED ORDER — PROMETHAZINE HCL 25 MG/ML IJ SOLN: 6.2500 mg | INTRAMUSCULAR | Status: DC | PRN

## 2014-12-23 MED ORDER — ACETAMINOPHEN 650 MG RE SUPP: 650.0000 mg | Freq: Four times a day (QID) | RECTAL | Status: DC | PRN

## 2014-12-23 MED ORDER — OXYCODONE HCL 5 MG PO TABS
ORAL_TABLET | ORAL | Status: AC
  Filled 2014-12-23: qty 1

## 2014-12-23 MED ORDER — ASPIRIN EC 325 MG PO TBEC: 325.0000 mg | DELAYED_RELEASE_TABLET | Freq: Two times a day (BID) | ORAL | Status: DC

## 2014-12-23 MED ORDER — FENTANYL CITRATE (PF) 100 MCG/2ML IJ SOLN
INTRAMUSCULAR | Status: DC | PRN
  Administered 2014-12-23: 25 ug via INTRAVENOUS

## 2014-12-23 MED ORDER — ALUM & MAG HYDROXIDE-SIMETH 200-200-20 MG/5ML PO SUSP: 30.0000 mL | ORAL | Status: DC | PRN

## 2014-12-23 MED ORDER — BISACODYL 5 MG PO TBEC: 5.0000 mg | DELAYED_RELEASE_TABLET | Freq: Every day | ORAL | Status: DC | PRN

## 2014-12-23 MED ORDER — METOPROLOL TARTRATE 50 MG PO TABS
50.0000 mg | ORAL_TABLET | Freq: Two times a day (BID) | ORAL | Status: DC
  Administered 2014-12-23 – 2014-12-25 (×4): 50 mg via ORAL
  Filled 2014-12-23 (×4): qty 1

## 2014-12-23 MED ORDER — DEXTROSE-NACL 5-0.45 % IV SOLN: INTRAVENOUS | Status: DC

## 2014-12-23 MED ORDER — MIDAZOLAM HCL 5 MG/5ML IJ SOLN
INTRAMUSCULAR | Status: DC | PRN
  Administered 2014-12-23: 1 mg via INTRAVENOUS

## 2014-12-23 MED ORDER — FERROUS SULFATE 325 (65 FE) MG PO TABS
325.0000 mg | ORAL_TABLET | Freq: Two times a day (BID) | ORAL | Status: DC
  Administered 2014-12-23 – 2014-12-25 (×4): 325 mg via ORAL
  Filled 2014-12-23 (×4): qty 1

## 2014-12-23 MED ORDER — FENTANYL CITRATE (PF) 250 MCG/5ML IJ SOLN
INTRAMUSCULAR | Status: AC
  Filled 2014-12-23: qty 5

## 2014-12-23 MED ORDER — METOCLOPRAMIDE HCL 5 MG PO TABS: 5.0000 mg | ORAL_TABLET | Freq: Three times a day (TID) | ORAL | Status: DC | PRN

## 2014-12-23 MED ORDER — SENNOSIDES-DOCUSATE SODIUM 8.6-50 MG PO TABS: 1.0000 | ORAL_TABLET | Freq: Every evening | ORAL | Status: DC | PRN

## 2014-12-23 MED ORDER — OXYCODONE HCL 5 MG PO TABS
5.0000 mg | ORAL_TABLET | ORAL | Status: DC | PRN
  Administered 2014-12-23 – 2014-12-25 (×5): 5 mg via ORAL
  Filled 2014-12-23 (×3): qty 1
  Filled 2014-12-23: qty 2
  Filled 2014-12-23: qty 1

## 2014-12-23 MED ORDER — MENTHOL 3 MG MT LOZG: 1.0000 | LOZENGE | OROMUCOSAL | Status: DC | PRN

## 2014-12-23 MED ORDER — DIPHENHYDRAMINE HCL 12.5 MG/5ML PO ELIX: 12.5000 mg | ORAL_SOLUTION | ORAL | Status: DC | PRN

## 2014-12-23 MED ORDER — LACTATED RINGERS IV SOLN
INTRAVENOUS | Status: DC
  Administered 2014-12-23: 09:00:00 via INTRAVENOUS

## 2014-12-23 MED ORDER — SERTRALINE HCL 25 MG PO TABS
25.0000 mg | ORAL_TABLET | Freq: Every day | ORAL | Status: DC
  Administered 2014-12-24 – 2014-12-25 (×2): 25 mg via ORAL
  Filled 2014-12-23 (×2): qty 1

## 2014-12-23 SURGICAL SUPPLY — 59 items
BANDAGE ELASTIC 6 VELCRO ST LF (GAUZE/BANDAGES/DRESSINGS) ×3 IMPLANT
BANDAGE ESMARK 6X9 LF (GAUZE/BANDAGES/DRESSINGS) ×1 IMPLANT
BLADE SAG 18X100X1.27 (BLADE) ×3 IMPLANT
BLADE SAW SGTL 13X75X1.27 (BLADE) ×3 IMPLANT
BLADE SAW SGTL 18X1.27X75 (BLADE) IMPLANT
BLADE SAW SGTL 18X1.27X75MM (BLADE)
BLADE SURG ROTATE 9660 (MISCELLANEOUS) IMPLANT
BNDG CMPR 9X6 STRL LF SNTH (GAUZE/BANDAGES/DRESSINGS) ×1
BNDG CMPR MED 10X6 ELC LF (GAUZE/BANDAGES/DRESSINGS) ×1
BNDG ELASTIC 6X10 VLCR STRL LF (GAUZE/BANDAGES/DRESSINGS) ×3 IMPLANT
BNDG ESMARK 6X9 LF (GAUZE/BANDAGES/DRESSINGS) ×3
BOWL SMART MIX CTS (DISPOSABLE) ×3 IMPLANT
CAPT KNEE TOTAL 3 ATTUNE ×3 IMPLANT
CEMENT HV SMART SET (Cement) ×6 IMPLANT
COVER SURGICAL LIGHT HANDLE (MISCELLANEOUS) ×3 IMPLANT
CUFF TOURNIQUET SINGLE 34IN LL (TOURNIQUET CUFF) ×3 IMPLANT
CUFF TOURNIQUET SINGLE 44IN (TOURNIQUET CUFF) IMPLANT
DRAPE EXTREMITY T 121X128X90 (DRAPE) ×3 IMPLANT
DRAPE U-SHAPE 47X51 STRL (DRAPES) ×3 IMPLANT
DURAPREP 26ML APPLICATOR (WOUND CARE) ×6 IMPLANT
ELECT REM PT RETURN 9FT ADLT (ELECTROSURGICAL) ×3
ELECTRODE REM PT RTRN 9FT ADLT (ELECTROSURGICAL) ×1 IMPLANT
EVACUATOR 1/8 PVC DRAIN (DRAIN) IMPLANT
GAUZE SPONGE 4X4 12PLY STRL (GAUZE/BANDAGES/DRESSINGS) ×6 IMPLANT
GAUZE XEROFORM 1X8 LF (GAUZE/BANDAGES/DRESSINGS) ×3 IMPLANT
GLOVE BIO SURGEON STRL SZ7.5 (GLOVE) ×3 IMPLANT
GLOVE BIO SURGEON STRL SZ8.5 (GLOVE) ×3 IMPLANT
GLOVE BIOGEL PI IND STRL 8 (GLOVE) ×1 IMPLANT
GLOVE BIOGEL PI IND STRL 9 (GLOVE) ×1 IMPLANT
GLOVE BIOGEL PI INDICATOR 8 (GLOVE) ×2
GLOVE BIOGEL PI INDICATOR 9 (GLOVE) ×2
GOWN STRL REUS W/ TWL LRG LVL3 (GOWN DISPOSABLE) ×1 IMPLANT
GOWN STRL REUS W/ TWL XL LVL3 (GOWN DISPOSABLE) ×2 IMPLANT
GOWN STRL REUS W/TWL LRG LVL3 (GOWN DISPOSABLE) ×3
GOWN STRL REUS W/TWL XL LVL3 (GOWN DISPOSABLE) ×6
HANDPIECE INTERPULSE COAX TIP (DISPOSABLE) ×2
HOOD PEEL AWAY FACE SHEILD DIS (HOOD) ×6 IMPLANT
KIT BASIN OR (CUSTOM PROCEDURE TRAY) ×3 IMPLANT
KIT ROOM TURNOVER OR (KITS) ×3 IMPLANT
MANIFOLD NEPTUNE II (INSTRUMENTS) ×3 IMPLANT
NS IRRIG 1000ML POUR BTL (IV SOLUTION) ×3 IMPLANT
PACK TOTAL JOINT (CUSTOM PROCEDURE TRAY) ×3 IMPLANT
PACK UNIVERSAL I (CUSTOM PROCEDURE TRAY) ×3 IMPLANT
PAD ARMBOARD 7.5X6 YLW CONV (MISCELLANEOUS) ×6 IMPLANT
PADDING CAST COTTON 6X4 STRL (CAST SUPPLIES) ×3 IMPLANT
SET HNDPC FAN SPRY TIP SCT (DISPOSABLE) ×1 IMPLANT
SPONGE GAUZE 4X4 12PLY STER LF (GAUZE/BANDAGES/DRESSINGS) ×3 IMPLANT
SUT VIC AB 1 CTX 36 (SUTURE) ×3
SUT VIC AB 1 CTX36XBRD ANBCTR (SUTURE) ×1 IMPLANT
SUT VIC AB 2-0 CT1 27 (SUTURE) ×3
SUT VIC AB 2-0 CT1 TAPERPNT 27 (SUTURE) ×1 IMPLANT
SUT VIC AB 3-0 CT1 27 (SUTURE) ×3
SUT VIC AB 3-0 CT1 TAPERPNT 27 (SUTURE) ×1 IMPLANT
SUT VIC AB 3-0 FS2 27 (SUTURE) ×3 IMPLANT
SYR 50ML LL SCALE MARK (SYRINGE) ×3 IMPLANT
TOWEL OR 17X24 6PK STRL BLUE (TOWEL DISPOSABLE) ×3 IMPLANT
TOWEL OR 17X26 10 PK STRL BLUE (TOWEL DISPOSABLE) ×3 IMPLANT
TRAY CATH 16FR W/PLASTIC CATH (SET/KITS/TRAYS/PACK) IMPLANT
WATER STERILE IRR 1000ML POUR (IV SOLUTION) ×9 IMPLANT

## 2014-12-23 NOTE — Interval H&P Note (Signed)
History and Physical Interval Note:  12/23/2014 9:42 AM  [name] [name]  has presented today for surgery, with the diagnosis of OSTEOARTHRITIS LEFT KNEE  The various methods of treatment have been discussed with the patient and family. After consideration of risks, benefits and other options for treatment, the patient has consented to  Procedure(s): TOTAL KNEE ARTHROPLASTY (Left) as a surgical intervention .  The patient's history has been reviewed, patient examined, no change in status, stable for surgery.  I have reviewed the patient's chart and labs.  Questions were answered to the patient's satisfaction.     Kerin Salen

## 2014-12-23 NOTE — Anesthesia Postprocedure Evaluation (Signed)
  Anesthesia Post-op Note  Patient: [name] [name]  Procedure(s) Performed: Procedure(s) (LRB): TOTAL KNEE ARTHROPLASTY (Left)  Patient Location: PACU  Anesthesia Type: Spinal  Level of Consciousness: awake and alert   Airway and Oxygen Therapy: Patient Spontanous Breathing  Post-op Pain: mild  Post-op Assessment: Post-op Vital signs reviewed, Patient's Cardiovascular Status Stable, Respiratory Function Stable, Patent Airway and No signs of Nausea or vomiting  Last Vitals:  Filed Vitals:   12/23/14 1245  BP: 149/80  Pulse: 58  Temp:   Resp: 15    Post-op Vital Signs: stable   Complications: No apparent anesthesia complications

## 2014-12-23 NOTE — Anesthesia Preprocedure Evaluation (Signed)
Anesthesia Evaluation  Patient identified by MRN, date of birth, ID band Patient awake    Reviewed: Allergy & Precautions, NPO status , Patient's Chart, lab work & pertinent test results  Airway Mallampati: II  TM Distance: >3 FB Neck ROM: Full    Dental no notable dental hx.    Pulmonary sleep apnea and Continuous Positive Airway Pressure Ventilation , former smoker,    Pulmonary exam normal breath sounds clear to auscultation       Cardiovascular hypertension, Pt. on medications and Pt. on home beta blockers Normal cardiovascular exam Rhythm:Regular Rate:Normal     Neuro/Psych Anxiety negative neurological ROS     GI/Hepatic Neg liver ROS, GERD  Medicated,  Endo/Other  negative endocrine ROS  Renal/GU negative Renal ROS  negative genitourinary   Musculoskeletal negative musculoskeletal ROS (+)   Abdominal   Peds negative pediatric ROS (+)  Hematology negative hematology ROS (+)   Anesthesia Other Findings   Reproductive/Obstetrics negative OB ROS                             Anesthesia Physical Anesthesia Plan  ASA: III  Anesthesia Plan: Spinal   Post-op Pain Management:    Induction: Intravenous  Airway Management Planned: Simple Face Mask  Additional Equipment:   Intra-op Plan:   Post-operative Plan:   Informed Consent: I have reviewed the patients History and Physical, chart, labs and discussed the procedure including the risks, benefits and alternatives for the proposed anesthesia with the patient or authorized representative who has indicated his/her understanding and acceptance.   Dental advisory given  Plan Discussed with: CRNA and Surgeon  Anesthesia Plan Comments:         Anesthesia Quick Evaluation

## 2014-12-23 NOTE — Progress Notes (Signed)
Report received from [facility]

## 2014-12-23 NOTE — Discharge Instructions (Signed)

## 2014-12-23 NOTE — Op Note (Signed)
PATIENT ID:      [name] [name]  MRN:     [id number] DOB/AGE:    74-May-1942 / 74 y.o.       OPERATIVE REPORT    DATE OF PROCEDURE:  12/23/2014       PREOPERATIVE DIAGNOSIS:   OSTEOARTHRITIS LEFT KNEE      Estimated body mass index is 33.21 kg/(m^2) as calculated from the following:   Height as of this encounter: 6' (1.829 m).   Weight as of this encounter: 111.086 kg (244 lb 14.4 oz).                                                        POSTOPERATIVE DIAGNOSIS:   OSTEOARTHRITIS LEFT KNEE                                                                      PROCEDURE:  Procedure(s): TOTAL KNEE ARTHROPLASTY Using DepuyAttune RP implants #7L Femur, 8#Tibia, 5 mm Attune RP bearing, 41 Patella     SURGEON: Hani Patnode J    ASSISTANT:   [employer]   (Present and scrubbed throughout the case, critical for assistance with exposure, retraction, instrumentation, and closure.)         ANESTHESIA: Spinal, Exparel  EBL: 300  FLUID REPLACEMENT: 1800 crystalloid  TOURNIQUET TIME: 42min  Drains: None  Tranexamic Acid: 1gm iv   COMPLICATIONS:  None         INDICATIONS FOR PROCEDURE: The patient has  OSTEOARTHRITIS LEFT KNEE, VAL deformities, XR shows bone on bone arthritis, lateral subluxation of tibia. Patient has failed all conservative measures including anti-inflammatory medicines, narcotics, attempts at  exercise and weight loss, cortisone injections and viscosupplementation.  Risks and benefits of surgery have been discussed, questions answered.   DESCRIPTION OF PROCEDURE: The patient identified by armband, received  IV antibiotics, in the holding area at [facility]. Patient taken to the operating room, appropriate anesthetic  monitors were attached, and Spinal anesthesia was  induced. Tourniquet  applied high to the operative thigh. Lateral post and foot positioner  applied to the table, the lower extremity was then prepped and draped  in usual sterile fashion from the  toes to the tourniquet. Time-out procedure was performed. We began the operation, with the knee flexed 120 degrees, by making the anterior midline incision starting at handbreadth above the patella going over the patella 1 cm medial to and 4 cm distal to the tibial tubercle. Small bleeders in the skin and the  subcutaneous tissue identified and cauterized. Transverse retinaculum was incised and reflected medially and a medial parapatellar arthrotomy was accomplished. the patella was everted and theprepatellar fat pad resected. The superficial medial collateral  ligament was then elevated from anterior to posterior along the proximal  flare of the tibia and anterior half of the menisci resected. The knee was hyperflexed exposing bone on bone arthritis. Peripheral and notch osteophytes as well as the cruciate ligaments were then resected. We continued to  work our way around posteriorly along the proximal tibia, and externally  rotated  the tibia subluxing it out from underneath the femur. A McHale  retractor was placed through the notch and a lateral Hohmann retractor  placed, and we then drilled through the proximal tibia in line with the  axis of the tibia followed by an intramedullary guide rod and 2-degree  posterior slope cutting guide. The tibial cutting guide, 3 degree posterior sloped, was pinned into place allowing resection of 7 mm of bone medially and about 2 mm of bone laterally. Satisfied with the tibial resection, we then  entered the distal femur 2 mm anterior to the PCL origin with the  intramedullary guide rod and applied the distal femoral cutting guide  set at 24mm, with 5 degrees of valgus. This was pinned along the  epicondylar axis. At this point, the distal femoral cut was accomplished without difficulty. We then sized for a #7L femoral component and pinned the guide in 0 degrees of external rotation.The chamfer cutting guide was pinned into place. The anterior, posterior, and  chamfer cuts were accomplished without difficulty followed by  the Attune RP box cutting guide and the box cut. We also removed posterior osteophytes from the posterior femoral condyles. At this  time, the knee was brought into full extension. We checked our  extension and flexion gaps and found them symmetric for a 6 mm bearing. Distracting in extension with a lamina spreader, the posterior horns of the menisci were removed, and Exparel, diluted to 60 cc, was injected into the capsule of the knee. The  posterior patella cut was accomplished with the 9.5 mm Attune cutting guide, sized at 41 dome, and the fixation pegs drilled.The knee  was then once again hyperflexed exposing the proximal tibia. We sized for a #8 tibial base plate, applied the smokestack and the conical reamer followed by the the Delta fin keel punch. We then hammered into place the Attune RP trial femoral component, inserted a  6 mm trial bearing, trial patellar button, and took the knee through range of motion from 0-130 degrees. No thumb pressure was required for patellar  Tracking. At this point, the limb was wrapped with an Esmarch bandage and the tourniquet inflated to 350 mmHg. All trial components were removed, mating surfaces irrigated with pulse lavage, and dried with suction and sponges. A double batch of DePuy HV cement with 1500 mg of Zinacef was mixed and applied to all bony metallic mating surfaces except for the posterior condyles of the femur itself. In order, we  hammered into place the tibial tray and removed excess cement, the femoral component and removed excess cement,  The final Attune RP bearing  was inserted, and the knee brought to full extension with compression.  The patellar button was clamped into place, and excess cement  removed. While the cement cured the wound was irrigated out with normal saline solution pulse lavage. Ligament stability and patellar tracking were checked and found to be excellent. The  parapatellar arthrotomy was closed with  running #1 Vicryl suture. The subcutaneous tissue with 0 and 2-0 undyed  Vicryl suture, and the skin with running 3-0 SQ vicryl. A dressing of Xeroform,  4 x 4, dressing sponges, Webril, and Ace wrap applied. The patient  awakened, extubated, and taken to recovery room without difficulty.   Saprina Chuong J 12/23/2014, 11:18 AM

## 2014-12-23 NOTE — Care Management Important Message (Signed)
Important Message  Patient Details  Name: [name] [name] MRN: [id number] Date of Birth: 21-Jul-1940   Medicare Important Message Given:  Yes-second notification given    Loann Quill 12/23/2014, 1:42 PM

## 2014-12-23 NOTE — Progress Notes (Signed)
Utilization review completed.  

## 2014-12-23 NOTE — Transfer of Care (Signed)
Immediate Anesthesia Transfer of Care Note  Patient: [name] [name]  Procedure(s) Performed: Procedure(s): TOTAL KNEE ARTHROPLASTY (Left)  Patient Location: PACU  Anesthesia Type:MAC and Spinal  Level of Consciousness: lethargic  Airway & Oxygen Therapy: Patient Spontanous Breathing and Patient connected to face mask oxygen  Post-op Assessment: Report given to RN and Post -op Vital signs reviewed and stable  Post vital signs: Reviewed and stable  Last Vitals:  Filed Vitals:   12/23/14 0833  BP: 130/76  Pulse: 70  Temp: 36.2 C  Resp: 18    Complications: No apparent anesthesia complications

## 2014-12-23 NOTE — Anesthesia Procedure Notes (Addendum)
Spinal Patient location during procedure: OR Staffing Performed by: anesthesiologist  Preanesthetic Checklist Completed: patient identified, site marked, surgical consent, pre-op evaluation, timeout performed, IV checked, risks and benefits discussed and monitors and equipment checked Spinal Block Patient position: sitting Prep: Betadine Patient monitoring: heart rate, continuous pulse ox and blood pressure Location: L4-5 Injection technique: single-shot Needle Needle type: Spinocan  Needle gauge: 22 G Needle length: 9 cm Additional Notes Expiration date of kit checked and confirmed. Patient tolerated procedure well, without complications.    Procedure Name: MAC Date/Time: 12/23/2014 9:47 AM Performed by: Layla Maw Pre-anesthesia Checklist: Patient identified, Emergency Drugs available, Suction available and Patient being monitored Patient Re-evaluated:Patient Re-evaluated prior to inductionOxygen Delivery Method: Simple face mask Number of attempts: 1

## 2014-12-23 NOTE — Progress Notes (Signed)
Orthopedic Tech Progress Note Patient Details:  [name] [name] 25-Jan-1941 [id number] Viewed order from doctor's order list CPM Left Knee CPM Left Knee: On Left Knee Flexion (Degrees): 40 Left Knee Extension (Degrees): 0 Additional Comments: trapeze bar patient helper   [name] 12/23/2014, 12:11 PM

## 2014-12-24 ENCOUNTER — Encounter (HOSPITAL_COMMUNITY): Payer: Self-pay | Admitting: Orthopedic Surgery

## 2014-12-24 LAB — CBC
HEMATOCRIT: 31 % — AB (ref 39.0–52.0)
Hemoglobin: 10.1 g/dL — ABNORMAL LOW (ref 13.0–17.0)
MCH: 29.5 pg (ref 26.0–34.0)
MCHC: 32.6 g/dL (ref 30.0–36.0)
MCV: 90.6 fL (ref 78.0–100.0)
Platelets: 264 10*3/uL (ref 150–400)
RBC: 3.42 MIL/uL — AB (ref 4.22–5.81)
RDW: 14.7 % (ref 11.5–15.5)
WBC: 10 10*3/uL (ref 4.0–10.5)

## 2014-12-24 LAB — BASIC METABOLIC PANEL
Anion gap: 7 (ref 5–15)
BUN: 8 mg/dL (ref 6–20)
CALCIUM: 10.4 mg/dL — AB (ref 8.9–10.3)
CO2: 26 mmol/L (ref 22–32)
CREATININE: 1.08 mg/dL (ref 0.61–1.24)
Chloride: 101 mmol/L (ref 101–111)
GFR calc non Af Amer: >60 mL/min (ref >60)
Glucose, Bld: 172 mg/dL — ABNORMAL HIGH (ref 65–99)
Potassium: 4.3 mmol/L (ref 3.5–5.1)
SODIUM: 134 mmol/L — AB (ref 135–145)

## 2014-12-24 NOTE — Progress Notes (Signed)
Physical Therapy Treatment Patient Details Name: [name] [name] MRN: [id number] DOB: 1940-10-07 Today's Date: 12/24/2014    History of Present Illness Pt admitted for L TKA. PMHx: prostatis, BPH, anxiety, OSA    PT Comments    [name] [name] still in chair from this morning and states he has not used his CPM. Pt with lunch tray uneaten on arrival. Pt performed gait and HEP with return to chair and lunch heated so he could eat. Pt informed of need to return to bed for CPM with nursing immediately after lunch. Pt with increased flat affect and apparent difficulty with problem solving this PM. Pt incontinent of urine on return to room despite asking if need to void before initiation of gait. Will continue to follow.   Follow Up Recommendations  Home health PT     Equipment Recommendations  Rolling walker with 5" wheels;3in1 (PT)    Recommendations for Other Services       Precautions / Restrictions Precautions Precautions: Fall;Knee Restrictions LLE Weight Bearing: Weight bearing as tolerated    Mobility  Bed Mobility               General bed mobility comments: in chair on arrival and end of session  Transfers Overall transfer level: Needs assistance   Transfers: Sit to/from Stand Sit to Stand: Min assist         General transfer comment: cues for hand placement, anterior translation and assist to rise from surface, minguard to sit in chair  Ambulation/Gait Ambulation/Gait assistance: Min guard Ambulation Distance (Feet): 80 Feet Assistive device: Rolling walker (2 wheeled) Gait Pattern/deviations: Step-to pattern;Decreased stance time - left   Gait velocity interpretation: Below normal speed for age/gender General Gait Details: cues for sequence, posture and looking up   Stairs Stairs: Yes Stairs assistance: Min assist Stair Management: Backwards;With walker Number of Stairs: 2 General stair comments: cues for sequence and assist for RW stability with wife  assisting for RW support  Wheelchair Mobility    Modified Rankin (Stroke Patients Only)       Balance                                    Cognition Arousal/Alertness: Awake/alert Behavior During Therapy: Flat affect Overall Cognitive Status: Within Functional Limits for tasks assessed                      Exercises Total Joint Exercises Quad Sets: AROM;Left;Supine;10 reps Heel Slides: AAROM;Left;Supine;10 reps Long Arc Quad: AAROM;Seated;10 reps;Left Goniometric ROM: 10-60    General Comments        Pertinent Vitals/Pain Pain Score: 4  Pain Location: left knee Pain Descriptors / Indicators: Aching Pain Intervention(s): Repositioned;Monitored during session;Limited activity within patient's tolerance    Home Living                      Prior Function            PT Goals (current goals can now be found in the care plan section) Progress towards PT goals: Progressing toward goals    Frequency       PT Plan Current plan remains appropriate    Co-evaluation             End of Session   Activity Tolerance: Patient tolerated treatment well Patient left: in chair;with call bell/phone within reach;with family/visitor present  Time: 7915-0413 PT Time Calculation (min) (ACUTE ONLY): 24 min  Charges:  $Gait Training: 8-22 mins $Therapeutic Exercise: 8-22 mins                    G Codes:      Melford Aase Dec 27, 2014, 1:37 PM Elwyn Reach, Ray

## 2014-12-24 NOTE — Progress Notes (Signed)
Placed patient on CPAP set at 10cmH20 for the night. Will continue to monitor patient.

## 2014-12-24 NOTE — Progress Notes (Signed)
OT Cancellation Note  Patient Details Name: [name] [name] MRN: [id number] DOB: Feb 24, 1940   Cancelled Treatment:    Reason Eval/Treat Not Completed:  (Pt had only been in CPM around 30 minutes per wife. Wife wanting OT to return tomorrow.  Benito Mccreedy OTR/L [phone] 12/24/2014, 3:33 PM

## 2014-12-24 NOTE — Progress Notes (Signed)
Patient ID: [name] [name], male   DOB: [birth year], 74 y.o.   MRN: [id number] PATIENT ID: [name] [name]  MRN: [id number]  DOB/AGE:  [birth year] / 74 y.o.  1 Day Post-Op Procedure(s) (LRB): TOTAL KNEE ARTHROPLASTY (Left)    PROGRESS NOTE Subjective: Patient is alert, oriented, no Nausea, no Vomiting, yes passing gas, no Bowel Movement. Taking PO well. Denies SOB, Chest or Calf Pain. Using Incentive Spirometer, PAS in place. Ambulate WBAT today, CPM 0-40 Patient reports pain as 2 on 0-10 scale and 5 on 0-10 scale  .    Objective: Vital signs in last 24 hours: Filed Vitals:   12/23/14 1945 12/23/14 2113 12/24/14 0230 12/24/14 0550  BP:  167/77 170/72 182/84  Pulse: 70 81 73 85  Temp:  98.5 F (36.9 C) 99.7 F (37.6 C) 97.9 F (36.6 C)  TempSrc:  Oral Oral Oral  Resp: 17 16 16 16   Height:      Weight:      SpO2: 100% 100% 100% 99%      Intake/Output from previous day: I/O last 3 completed shifts: In: 1250 [P.O.:150; I.V.:1100] Out: 416 [Urine:316; Blood:100]   Intake/Output this shift:     LABORATORY DATA:  Recent Labs  12/23/14 0833 12/24/14 0419  WBC  --  10.0  HGB  --  10.1*  HCT  --  31.0*  PLT  --  264  NA 139 134*  K 5.5* 4.3  CL 108 101  CO2 23 26  BUN 13 8  CREATININE 1.12 1.08  GLUCOSE 117* 172*  CALCIUM 11.1* 10.4*    Examination: Neurologically intact ABD soft Neurovascular intact Sensation intact distally Intact pulses distally Dorsiflexion/Plantar flexion intact Incision: dressing C/D/I No cellulitis present Compartment soft}  Assessment:   1 Day Post-Op Procedure(s) (LRB): TOTAL KNEE ARTHROPLASTY (Left) ADDITIONAL DIAGNOSIS: Expected Acute Blood Loss Anemia, Hypertension, BPH  Plan: PT/OT WBAT, CPM 5/hrs day until ROM 0-90 degrees, then D/C CPM DVT Prophylaxis:  SCDx72hrs, ASA 325 mg BID x 2 weeks DISCHARGE PLAN: Home DISCHARGE NEEDS: HHPT, CPM, Walker and 3-in-1 comode seat     Derisha Funderburke J 12/24/2014, 7:08 AM

## 2014-12-24 NOTE — Care Management Note (Signed)
Case Management Note  Patient Details  Name: [name] [name] MRN: [id number] Date of Birth: 1941-02-08  Subjective/Objective:  74 yr old male s/p left total knee arthroplasty.               Action/Plan: Case manager spoke with [name] at the bedside concerning home health and DME needs. Choice was offered. Patient was preoperatively setup with [facility], no changes. He has family support at discharge.    Expected Discharge Date:    12/24/14              Expected Discharge Plan:  [place]  In-House Referral:     Discharge planning Services  CM Consult  Post Acute Care Choice:  Home Health, Durable Medical Equipment Choice offered to:  Patient  DME Arranged:  3-N-1, CPM, Walker rolling DME Agency:  [employer]  HH Arranged:  PT HH Agency:  [facility]  Status of Service:  Completed, signed off  Medicare Important Message Given:  Yes-second notification given Date Medicare IM Given:    Medicare IM give by:    Date Additional Medicare IM Given:    Additional Medicare Important Message give by:     If discussed at [place] of Stay Meetings, dates discussed:    Additional Comments:  Ninfa Meeker, RN 12/24/2014, 10:47 AM

## 2014-12-24 NOTE — Clinical Social Work Note (Signed)
CSW received referral for SNF.  Case discussed with case manager and plan is to discharge home.  CSW to sign off please re-consult if social work needs arise.  Taniqua Issa R. Chery Giusto, MSW, LCSWA [phone]  

## 2014-12-24 NOTE — Evaluation (Signed)
Physical Therapy Evaluation Patient Details Name: [name] [name] MRN: [id number] DOB: 08-01-1940 Today's Date: 12/24/2014   History of Present Illness  Pt admitted for L TKA. PMHx: prostatis, BPH, anxiety, OSA  Clinical Impression  Pt very pleasant, moving well and able to walk in hall and perform stairs on initial gait trial. Pt educated for restrictions, plan, HEP and encouraged to continue HEP throughout the day as well as education for CPM use. Pt will benefit from acute therapy to maximize mobility, function, gait, strength and ROM to decrease burden of care and return pt to PLOF.     Follow Up Recommendations Home health PT    Equipment Recommendations  Rolling walker with 5" wheels;3in1 (PT)    Recommendations for Other Services       Precautions / Restrictions Precautions Precautions: Fall;Knee Restrictions Weight Bearing Restrictions: Yes LLE Weight Bearing: Weight bearing as tolerated      Mobility  Bed Mobility Overal bed mobility: Modified Independent             General bed mobility comments: with HOB 30 degrees and rail   Transfers Overall transfer level: Needs assistance   Transfers: Sit to/from Stand Sit to Stand: Min assist         General transfer comment: cues for hand placement, anterior translation and assist to rise from surface, minguard to sit in chair  Ambulation/Gait Ambulation/Gait assistance: Min guard Ambulation Distance (Feet): 75 Feet Assistive device: Rolling walker (2 wheeled) Gait Pattern/deviations: Step-to pattern;Antalgic;Decreased stance time - left   Gait velocity interpretation: Below normal speed for age/gender General Gait Details: cues for sequence, posture and looking up  Stairs Stairs: Yes Stairs assistance: Min assist Stair Management: Backwards;With walker Number of Stairs: 2 General stair comments: cues for sequence and assist for RW stability  Wheelchair Mobility    Modified Rankin (Stroke Patients  Only)       Balance                                             Pertinent Vitals/Pain Pain Assessment: 0-10 Pain Score: 3  Pain Location: left knee Pain Descriptors / Indicators: Aching Pain Intervention(s): Monitored during session;Limited activity within patient's tolerance;Repositioned    Home Living Family/patient expects to be discharged to:: Private residence Living Arrangements: Spouse/significant other Available Help at Discharge: Family;Available 24 hours/day Type of Home: House Home Access: Stairs to enter Entrance Stairs-Rails: Right Entrance Stairs-Number of Steps: 3 Home Layout: Multi-level;Able to live on main level with bedroom/bathroom Home Equipment: None      Prior Function Level of Independence: Independent               Hand Dominance        Extremity/Trunk Assessment   Upper Extremity Assessment: Overall WFL for tasks assessed           Lower Extremity Assessment: LLE deficits/detail   LLE Deficits / Details: limited strength and ROM as expected post op  Cervical / Trunk Assessment: Normal  Communication   Communication: No difficulties  Cognition Arousal/Alertness: Awake/alert Behavior During Therapy: WFL for tasks assessed/performed Overall Cognitive Status: Within Functional Limits for tasks assessed                      General Comments      Exercises Total Joint Exercises Ankle Circles/Pumps: AROM;Seated;Both;5 reps Quad Sets: AROM;Left;5 reps;Supine  Heel Slides: AAROM;Left;5 reps;Supine      Assessment/Plan    PT Assessment Patient needs continued PT services  PT Diagnosis Difficulty walking;Acute pain   PT Problem List Decreased strength;Decreased range of motion;Decreased activity tolerance;Decreased mobility;Decreased knowledge of use of DME;Decreased knowledge of precautions;Pain  PT Treatment Interventions DME instruction;Gait training;Stair training;Functional mobility  training;Therapeutic activities;Therapeutic exercise;Patient/family education   PT Goals (Current goals can be found in the Care Plan section) Acute Rehab 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 Formulation: With patient Time For Goal Achievement: 12/31/14 Potential to Achieve Goals: Good    Frequency 7X/week   Barriers to discharge        Co-evaluation               End of Session Equipment Utilized During Treatment: Gait belt Activity Tolerance: Patient tolerated treatment well Patient left: in chair;with call bell/phone within reach Nurse Communication: Mobility status;Weight bearing status         Time: 7482-7078 PT Time Calculation (min) (ACUTE ONLY): 24 min   Charges:   PT Evaluation $Initial PT Evaluation Tier I: 1 Procedure PT Treatments $Gait Training: 8-22 mins   PT G CodesMelford Aase 12/24/2014, 8:59 AM Elwyn Reach, [place]

## 2014-12-25 DIAGNOSIS — Z96652 Presence of left artificial knee joint: Secondary | ICD-10-CM | POA: Diagnosis not present

## 2014-12-25 LAB — CBC
HCT: 31.1 % — ABNORMAL LOW (ref 39.0–52.0)
Hemoglobin: 10.2 g/dL — ABNORMAL LOW (ref 13.0–17.0)
MCH: 29.7 pg (ref 26.0–34.0)
MCHC: 32.8 g/dL (ref 30.0–36.0)
MCV: 90.4 fL (ref 78.0–100.0)
Platelets: 245 10*3/uL (ref 150–400)
RBC: 3.44 MIL/uL — ABNORMAL LOW (ref 4.22–5.81)
RDW: 14.8 % (ref 11.5–15.5)
WBC: 10.5 10*3/uL (ref 4.0–10.5)

## 2014-12-25 NOTE — Progress Notes (Signed)
Physical Therapy Treatment Patient Details Name: [name] [name] MRN: [id number] DOB: 1940-05-02 Today's Date: 12/25/2014    History of Present Illness Pt admitted for L TKA. PMHx: prostatis, BPH, anxiety, OSA    PT Comments    [name] was sitting in recliner upon arrival to session. Had pt. perform bathroom activities prior to exercise. Post bathroom, pt. performed gait and stairs and returned to room, sitting in the recliner. [name] and wife were educated about home exercise program after exercise, prior to eating lunch.  OK for dc home from PT standpoint     Follow Up Recommendations  Home health PT     Equipment Recommendations  Rolling walker with 5" wheels;3in1 (PT)    Recommendations for Other Services       Precautions / Restrictions Precautions Precautions: Fall;Knee Restrictions Weight Bearing Restrictions: Yes LLE Weight Bearing: Weight bearing as tolerated    Mobility  Bed Mobility      General bed mobility comments: Sitting in chair upon arrival, sat in chair to eat lunch at end of session.  Transfers Overall transfer level: Minguard assist  Equipment used: Rolling walker (2 wheeled) Transfers: Sit to/from Stand Sit to Stand: Modified independent (Device/Increase time)         General transfer comment: Cues for hand placement and scooting forward into position; min assist to rise from chiar.  Ambulation/Gait Ambulation/Gait assistance: Minguard assist (Device/Increase time) Ambulation Distance (Feet): 200 Feet Assistive device: Rolling walker (2 wheeled) Gait Pattern/deviations: Step-through pattern;Decreased stance time - left;Decreased weight shift to left   Gait velocity interpretation: Below normal speed for age/gender General Gait Details: Cues for posture and looking up.Nice, stable knee in stance   Stairs Stairs: Yes Stairs assistance: Min assist Stair Management: Backwards Number of Stairs: 2 General stair comments: Cues  for sequence and assist for RW stability; wife instructed/educated with cues and demonstrated that she could assist pt.  Wheelchair Mobility    Modified Rankin (Stroke Patients Only)       Balance Overall balance assessment: Modified Independent Sitting-balance support: No upper extremity supported;Feet supported Sitting balance-Leahy Scale: Good     Standing balance support: Bilateral upper extremity supported;During functional activity Standing balance-Leahy Scale: Fair Standing balance comment: Min guard during walking (with walker); assist for balance during sit-to-stand                    Cognition Arousal/Alertness: Awake/alert Behavior During Therapy: WFL for tasks assessed/performed Overall Cognitive Status: Within Functional Limits for tasks assessed                      Exercises Total Joint Exercises Ankle Circles/Pumps: Seated;AROM;Left;10 reps Quad Sets: Seated;AROM;Left;20 reps Short Arc Quad: Seated;AAROM;Left;10 reps Heel Slides: Seated;AAROM;Left;10 reps Straight Leg Raises: Seated;AAROM;Left;10 reps    General Comments General comments (skin integrity, edema, etc.): Noted [name] IV was out after going to bathroom; no bleeding, RN notified. Wife mentioned the needle may have fallen out prior to PT session.      Pertinent Vitals/Pain Pain Assessment: 0-10 Pain Score: 3  Pain Location: L knee Pain Descriptors / Indicators: Aching Pain Intervention(s): Limited activity within patient's tolerance;Monitored during session;Premedicated before session    Home Living Family/patient expects to be discharged to:: Private residence Living Arrangements: Spouse/significant other Available Help at Discharge: Family;Available 24 hours/day Type of Home: House Home Access: Stairs to enter Entrance Stairs-Rails: Right Home Layout: Multi-level;Able to live on main level with bedroom/bathroom Home Equipment: None  Prior Function Level of  Independence: Independent          PT Goals (current goals can now be found in the care plan section) Acute Rehab 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 Formulation: With patient/family Time For Goal Achievement: 12/31/14 Potential to Achieve Goals: Good Progress towards PT goals: Progressing toward goals    Frequency  7X/week    PT Plan Current plan remains appropriate    Co-evaluation             End of Session Equipment Utilized During Treatment: Gait belt Activity Tolerance: Patient tolerated treatment well Patient left: in chair;with call bell/phone within reach;with family/visitor present     Time: 8032-1224 PT Time Calculation (min) (ACUTE ONLY): 43 min  Charges:                       G Codes:      Chaney Malling 01-22-2015, 12:46 PM  Roney Marion, [place] Pager [phone] Office [phone]

## 2014-12-25 NOTE — Progress Notes (Signed)
Occupational Therapy Evaluation Patient Details Name: [name] [name] MRN: [id number] DOB: [birth year] Today's Date: 12/25/2014    History of Present Illness   Pt admitted for L TKA. PMHx: prostatis, BPH, anxiety, OSA   Clinical Impression   Pt admitted with the above diagnoses and presents with below problem list. Pt will benefit from continued acute OT to address the below listed deficits and maximize independence with BADLs prior to d/c home with spouse assisting. PTA pt was independent with ADLs. Pt is currently min to mod A with LB ADLs and functional transfers. Spouse present for session. OT to continue to follow acutely and recommend HHOT at d/c.    Follow Up Recommendations  Supervision/Assistance - 24 hour;Home health OT    Equipment Recommendations  Other (comment) (Pt's recommended 3n1 has been delivered.)    Recommendations for Other Services       Precautions / Restrictions Restrictions Weight Bearing Restrictions: Yes LLE Weight Bearing: Weight bearing as tolerated      Mobility Bed Mobility Overal bed mobility: Needs Assistance Bed Mobility: Supine to Sit     Supine to sit: Supervision;HOB elevated     General bed mobility comments: has adjustable bed at home. Pt with extra effort, use of bed rails and HOB elevated to about 30 degrees.   Transfers Overall transfer level: Needs assistance Equipment used: Rolling walker (2 wheeled) Transfers: Sit to/from Stand Sit to Stand: Min assist         General transfer comment: cues for hand placement, scooting forward into position. min A to powerup from EOB at lowest height.     Balance Overall balance assessment: Needs assistance Sitting-balance support: No upper extremity supported;Feet supported Sitting balance-Leahy Scale: Good     Standing balance support: Bilateral upper extremity supported;During functional activity Standing balance-Leahy Scale: Poor Standing balance comment: assist for balance  during LB ADLs in sit<>stand                            ADL Overall ADL's : Needs assistance/impaired Eating/Feeding: Set up;Sitting   Grooming: Set up;Sitting   Upper Body Bathing: Set up;Sitting   Lower Body Bathing: Sit to/from stand;Moderate assistance   Upper Body Dressing : Set up;Sitting   Lower Body Dressing: Moderate assistance;Sit to/from stand   Toilet Transfer: Minimal assistance;Ambulation;BSC;RW   Toileting- Clothing Manipulation and Hygiene: Sitting/lateral lean;Set up   Tub/ Shower Transfer: Walk-in shower;Minimal assistance;Ambulation;3 in 1;Rolling walker   Functional mobility during ADLs: Min guard;Rolling walker General ADL Comments: Min guard for functional mobility. Min A for sit<>stand transfers. Pt needing assist to access left foot reaching straight down leg. Educated on atrategies for both pt and caregiver inclusing caregiver body mechanics to assist at d/c. Discussed AE for LB dressing setup and technique for shower transfers. Pt completed EOB UB/LB bathing and dressing and ambulated to recliner. Educated on boosting height of seat surfaces with pillows and on knee precautions. Spouse present and involved during session.      Vision     Perception     Praxis      Pertinent Vitals/Pain Pain Assessment: 0-10 Pain Score: 4  Pain Location: left knee at end of session Pain Descriptors / Indicators: Aching Pain Intervention(s): Limited activity within patient's tolerance;Monitored during session;Repositioned     Hand Dominance     Extremity/Trunk Assessment Upper Extremity Assessment Upper Extremity Assessment: Overall WFL for tasks assessed   Lower Extremity Assessment Lower Extremity Assessment: Defer to PT  evaluation       Communication Communication Communication: No difficulties   Cognition Arousal/Alertness: Awake/alert Behavior During Therapy: WFL for tasks assessed/performed Overall Cognitive Status: Within Functional  Limits for tasks assessed                     General Comments       Exercises       Shoulder Instructions      Home Living Family/patient expects to be discharged to:: Private residence Living Arrangements: Spouse/significant other Available Help at Discharge: Family;Available 24 hours/day Type of Home: House Home Access: Stairs to enter [employer] of Steps: 3 Entrance Stairs-Rails: Right Home Layout: Multi-level;Able to live on main level with bedroom/bathroom     Bathroom Shower/Tub: Occupational psychologist: Standard Bathroom Accessibility: Yes How Accessible: Accessible via walker Home Equipment: None          Prior Functioning/Environment Level of Independence: Independent             OT Diagnosis: Acute pain   OT Problem List: Impaired balance (sitting and/or standing);Decreased knowledge of use of DME or AE;Decreased knowledge of precautions;Pain   OT Treatment/Interventions: Self-care/ADL training;DME and/or AE instruction;Therapeutic activities;Patient/family education;Balance training    OT Goals(Current goals can be found in the care plan section) Acute Rehab OT Goals 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 Goal Formulation: With patient/family Time For Goal Achievement: 01/01/15 Potential to Achieve Goals: Good ADL Goals Pt Will Perform Lower Body Bathing: with modified independence;with adaptive equipment;sit to/from stand Pt Will Perform Lower Body Dressing: with modified independence;with adaptive equipment;sit to/from stand Pt Will Transfer to Toilet: with modified independence;ambulating;bedside commode Pt Will Perform Tub/Shower Transfer: Shower transfer;ambulating;3 in 1;rolling walker  OT Frequency: Min 2X/week   Barriers to D/C:            Co-evaluation              End of Session Equipment Utilized During Treatment: Gait belt;Rolling walker CPM Left Knee CPM Left Knee: Off Additional  Comments: yellow foam under left heel  Activity Tolerance: Patient tolerated treatment well Patient left: in chair;with call bell/phone within reach;with family/visitor present   Time: 9728-2060 OT Time Calculation (min): 33 min Charges:  OT General Charges $OT Visit: 1 Procedure OT Evaluation $Initial OT Evaluation Tier I: 1 Procedure OT Treatments $Self Care/Home Management : 8-22 mins G-Codes:    Hortencia Pilar 2015/01/09, 10:36 AM

## 2014-12-25 NOTE — Discharge Summary (Signed)
Patient ID: [name] [name] MRN: [id number] DOB/AGE: 06-12-1940 74 y.o.  Admit date: 12/23/2014 Discharge date: 12/25/2014  Admission Diagnoses:  Principal Problem:   Primary osteoarthritis of left knee Active Problems:   Arthritis of knee   Discharge Diagnoses:  Same  Past Medical History  Diagnosis Date  . Anxiety   . Asthma   . GERD (gastroesophageal reflux disease)   . Hyperlipidemia   . Hypertension   . Anemia   . RBBB (right bundle branch block)     stable  . Colon polyp   . Allergy     sees Dr. Donneta Romberg   . Osteoporosis 01-2010  . Arthritis   . Sleep apnea     cpap  . Depression     Surgeries: Procedure(s): TOTAL KNEE ARTHROPLASTY on 12/23/2014   Consultants:    Discharged Condition: Improved  Hospital Course: Makhai Fulco Redler is an 74 y.o. male who was admitted 12/23/2014 for operative treatment ofPrimary osteoarthritis of left knee. Patient has severe unremitting pain that affects sleep, daily activities, and work/hobbies. After pre-op clearance the patient was taken to the operating room on 12/23/2014 and underwent  Procedure(s): TOTAL KNEE ARTHROPLASTY.    Patient was given perioperative antibiotics: Anti-infectives    Start     Dose/Rate Route Frequency Ordered Stop   12/23/14 1030  cefUROXime (ZINACEF) injection  Status:  Discontinued       As needed 12/23/14 1030 12/23/14 1154   12/23/14 0600  ceFAZolin (ANCEF) 3 g in dextrose 5 % 50 mL IVPB     3 g 160 mL/hr over 30 Minutes Intravenous On call to O.R. 12/22/14 1320 12/23/14 1000       Patient was given sequential compression devices, early ambulation, and chemoprophylaxis to prevent DVT.  Patient benefited maximally from hospital stay and there were no complications.    Recent vital signs: Patient Vitals for the past 24 hrs:  BP Temp Temp src Pulse Resp SpO2  12/25/14 0645 (!) 166/76 mmHg 98.9 F (37.2 C) Oral 80 18 98 %  12/24/14 2248 - - - 89 19 -  12/24/14 2041 (!) 164/71 mmHg 99.4 F (37.4  C) Oral 92 17 100 %  12/24/14 1500 (!) 168/70 mmHg 99.9 F (37.7 C) - 81 18 99 %     Recent laboratory studies:  Recent Labs  12/23/14 0833 12/24/14 0419 12/25/14 0600  WBC  --  10.0 10.5  HGB  --  10.1* 10.2*  HCT  --  31.0* 31.1*  PLT  --  264 245  NA 139 134*  --   K 5.5* 4.3  --   CL 108 101  --   CO2 23 26  --   BUN 13 8  --   CREATININE 1.12 1.08  --   GLUCOSE 117* 172*  --   CALCIUM 11.1* 10.4*  --      Discharge Medications:     Medication List    TAKE these medications        alendronate 70 MG tablet  Commonly known as:  FOSAMAX  TAKE 1 TABLET BY MOUTH EVERY 7 DAYS. TAKE WITH FULL GLASS OF WATER ON AN EMPTY STOMACH     amLODipine 10 MG tablet  Commonly known as:  NORVASC  TAKE 1 TABLET (10 MG TOTAL) BY MOUTH DAILY.     aspirin 81 MG tablet  Take 81 mg by mouth daily as needed for pain.     aspirin EC 325 MG tablet  Take 1  tablet (325 mg total) by mouth 2 (two) times daily.     benazepril 20 MG tablet  Commonly known as:  LOTENSIN  Take 1 tablet (20 mg total) by mouth daily.     cetirizine 10 MG tablet  Commonly known as:  ZYRTEC  Take 10 mg by mouth daily.     ferrous sulfate 325 (65 FE) MG tablet  TAKE 1 TABLET BY MOUTH 2 TIMES DAILY WITH A MEAL.     fish oil-omega-3 fatty acids 1000 MG capsule  Take 2 g by mouth daily.     levothyroxine 50 MCG tablet  Commonly known as:  SYNTHROID, LEVOTHROID  Take 50 mcg by mouth daily before breakfast.     meloxicam 15 MG tablet  Commonly known as:  MOBIC  Take 15 mg by mouth daily.     metoprolol 50 MG tablet  Commonly known as:  LOPRESSOR  Take 1 tablet (50 mg total) by mouth 2 (two) times daily.     mirtazapine 15 MG tablet  Commonly known as:  REMERON  Take 15 mg by mouth at bedtime.     montelukast 10 MG tablet  Commonly known as:  SINGULAIR  Take 10 mg by mouth every evening.     multivitamin tablet  Take 1 tablet by mouth daily.     OLANZapine 10 MG tablet  Commonly known as:   ZYPREXA  Take 10 mg by mouth daily.     omeprazole 20 MG capsule  Commonly known as:  PRILOSEC  Take 1 capsule (20 mg total) by mouth daily.     oxyCODONE-acetaminophen 5-325 MG tablet  Commonly known as:  ROXICET  Take 1 tablet by mouth every 4 (four) hours as needed.     sertraline 25 MG tablet  Commonly known as:  ZOLOFT  Take 1 tablet (25 mg total) by mouth daily.     simvastatin 20 MG tablet  Commonly known as:  ZOCOR  TAKE 1 TABLET (20 MG TOTAL) BY MOUTH DAILY.     tadalafil 5 MG tablet  Commonly known as:  CIALIS  Once daily for BPH     tamsulosin 0.4 MG Caps capsule  Commonly known as:  FLOMAX  TAKE 1 CAPSULE (0.4 MG TOTAL) BY MOUTH DAILY.     tiZANidine 2 MG tablet  Commonly known as:  ZANAFLEX  Take 1 tablet (2 mg total) by mouth every 6 (six) hours as needed for muscle spasms.     traMADol 50 MG tablet  Commonly known as:  ULTRAM  Take 50 mg by mouth every 6 (six) hours as needed. for pain        Diagnostic Studies: No results found.  Disposition: Final discharge disposition not confirmed      Discharge Instructions    CPM    Complete by:  As directed   Continuous passive motion machine (CPM):      Use the CPM from 0 to 60  for 5 hours per day.      You may increase by 10 degrees per day.  You may break it up into 2 or 3 sessions per day.      Use CPM for 2 weeks or until you are told to stop.     Call MD / Call 911    Complete by:  As directed   If you experience chest pain or shortness of breath, CALL 911 and be transported to the hospital emergency room.  If you develope a fever above  69 F, pus (white drainage) or increased drainage or redness at the wound, or calf pain, call your surgeon's office.     Change dressing    Complete by:  As directed   Change dressing on 5, then change the dressing daily with sterile 4 x 4 inch gauze dressing and apply TED hose.  You may clean the incision with alcohol prior to redressing.     Constipation  Prevention    Complete by:  As directed   Drink plenty of fluids.  Prune juice may be helpful.  You may use a stool softener, such as Colace (over the counter) 100 mg twice a day.  Use MiraLax (over the counter) for constipation as needed.     Diet - low sodium heart healthy    Complete by:  As directed      Discharge instructions    Complete by:  As directed   Follow up in office with Dr. Mayer Camel in 2 weeks.     Driving restrictions    Complete by:  As directed   No driving for 2 weeks     Increase activity slowly as tolerated    Complete by:  As directed      Patient may shower    Complete by:  As directed   You may shower without a dressing once there is no drainage.  Do not wash over the wound.  If drainage remains, cover wound with plastic wrap and then shower.           Follow-up Information    Follow up with Kerin Salen, MD In 2 weeks.   Specialty:  Orthopedic Surgery   Contact information:   [place] [address] [phone]       Follow up with [facility].   Why:  Someone from [facility] will contact you to arrange date and time for visit.   Contact information:   [address] [place] [address] [phone]        Signed: Hardin Negus, ERIC R 12/25/2014, 7:53 AM

## 2014-12-25 NOTE — Progress Notes (Signed)
PATIENT ID: [name] [name]  MRN: [id number]  DOB/AGE:  1940-08-28 / 75 y.o.  2 Days Post-Op Procedure(s) (LRB): TOTAL KNEE ARTHROPLASTY (Left)    PROGRESS NOTE Subjective: Patient is alert, oriented, no Nausea, no Vomiting, yes passing gas. Taking PO well. Denies SOB, Chest or Calf Pain. Using Incentive Spirometer, PAS in place. Ambulate WBAT with pt walking 80 ft and trying 2 steps yesterday, CPM 0-40 Patient reports pain as 4/10 .    Objective: Vital signs in last 24 hours: Filed Vitals:   12/24/14 1500 12/24/14 2041 12/24/14 2248 12/25/14 0645  BP: 168/70 164/71  166/76  Pulse: 81 92 89 80  Temp: 99.9 F (37.7 C) 99.4 F (37.4 C)  98.9 F (37.2 C)  TempSrc:  Oral  Oral  Resp: 18 17 19 18   Height:      Weight:      SpO2: 99% 100%  98%      Intake/Output from previous day: I/O last 3 completed shifts: In: 1145.8 [I.V.:1145.8] Out: -    Intake/Output this shift:     LABORATORY DATA:  Recent Labs  12/23/14 0833 12/24/14 0419 12/25/14 0600  WBC  --  10.0 10.5  HGB  --  10.1* 10.2*  HCT  --  31.0* 31.1*  PLT  --  264 245  NA 139 134*  --   K 5.5* 4.3  --   CL 108 101  --   CO2 23 26  --   BUN 13 8  --   CREATININE 1.12 1.08  --   GLUCOSE 117* 172*  --   CALCIUM 11.1* 10.4*  --     Examination: Neurologically intact Neurovascular intact Sensation intact distally Intact pulses distally Dorsiflexion/Plantar flexion intact Incision: dressing C/D/I No cellulitis present Compartment soft}  Assessment:   2 Days Post-Op Procedure(s) (LRB): TOTAL KNEE ARTHROPLASTY (Left) ADDITIONAL DIAGNOSIS: Expected Acute Blood Loss Anemia, Hypertension, Sleep Apnea and BPH  Plan: PT/OT WBAT, CPM 5/hrs day until ROM 0-90 degrees, then D/C CPM DVT Prophylaxis:  SCDx72hrs, ASA 325 mg BID x 2 weeks DISCHARGE PLAN: Home DISCHARGE NEEDS: HHPT, CPM, Walker and 3-in-1 comode seat     [name] [name] R 12/25/2014, 7:50 AM

## 2014-12-25 NOTE — Progress Notes (Signed)
Pt ready for discharge. IV removed and changed into personal clothing. Discharge education/instructions discussed with both pt and his wife, and all questions/concerns answered with verbalized teach back. Pt's belongings collected and will be transported out with him to the car via wheelchair. Zola Button, RN

## 2014-12-26 DIAGNOSIS — N401 Enlarged prostate with lower urinary tract symptoms: Secondary | ICD-10-CM | POA: Diagnosis not present

## 2014-12-26 DIAGNOSIS — G4733 Obstructive sleep apnea (adult) (pediatric): Secondary | ICD-10-CM | POA: Diagnosis not present

## 2014-12-26 DIAGNOSIS — Z7982 Long term (current) use of aspirin: Secondary | ICD-10-CM | POA: Diagnosis not present

## 2014-12-26 DIAGNOSIS — Z96652 Presence of left artificial knee joint: Secondary | ICD-10-CM | POA: Diagnosis not present

## 2014-12-26 DIAGNOSIS — J45909 Unspecified asthma, uncomplicated: Secondary | ICD-10-CM | POA: Diagnosis not present

## 2014-12-26 DIAGNOSIS — Z79891 Long term (current) use of opiate analgesic: Secondary | ICD-10-CM | POA: Diagnosis not present

## 2014-12-26 DIAGNOSIS — F419 Anxiety disorder, unspecified: Secondary | ICD-10-CM | POA: Diagnosis not present

## 2014-12-26 DIAGNOSIS — D62 Acute posthemorrhagic anemia: Secondary | ICD-10-CM | POA: Diagnosis not present

## 2014-12-26 DIAGNOSIS — R32 Unspecified urinary incontinence: Secondary | ICD-10-CM | POA: Diagnosis not present

## 2014-12-26 DIAGNOSIS — Z791 Long term (current) use of non-steroidal anti-inflammatories (NSAID): Secondary | ICD-10-CM | POA: Diagnosis not present

## 2014-12-26 DIAGNOSIS — Z471 Aftercare following joint replacement surgery: Secondary | ICD-10-CM | POA: Diagnosis not present

## 2014-12-26 DIAGNOSIS — I10 Essential (primary) hypertension: Secondary | ICD-10-CM | POA: Diagnosis not present

## 2014-12-27 DIAGNOSIS — G4733 Obstructive sleep apnea (adult) (pediatric): Secondary | ICD-10-CM | POA: Diagnosis not present

## 2014-12-27 DIAGNOSIS — Z791 Long term (current) use of non-steroidal anti-inflammatories (NSAID): Secondary | ICD-10-CM | POA: Diagnosis not present

## 2014-12-27 DIAGNOSIS — Z471 Aftercare following joint replacement surgery: Secondary | ICD-10-CM | POA: Diagnosis not present

## 2014-12-27 DIAGNOSIS — Z7982 Long term (current) use of aspirin: Secondary | ICD-10-CM | POA: Diagnosis not present

## 2014-12-27 DIAGNOSIS — N401 Enlarged prostate with lower urinary tract symptoms: Secondary | ICD-10-CM | POA: Diagnosis not present

## 2014-12-27 DIAGNOSIS — Z79891 Long term (current) use of opiate analgesic: Secondary | ICD-10-CM | POA: Diagnosis not present

## 2014-12-27 DIAGNOSIS — R32 Unspecified urinary incontinence: Secondary | ICD-10-CM | POA: Diagnosis not present

## 2014-12-27 DIAGNOSIS — F419 Anxiety disorder, unspecified: Secondary | ICD-10-CM | POA: Diagnosis not present

## 2014-12-27 DIAGNOSIS — D62 Acute posthemorrhagic anemia: Secondary | ICD-10-CM | POA: Diagnosis not present

## 2014-12-27 DIAGNOSIS — Z96652 Presence of left artificial knee joint: Secondary | ICD-10-CM | POA: Diagnosis not present

## 2014-12-27 DIAGNOSIS — J45909 Unspecified asthma, uncomplicated: Secondary | ICD-10-CM | POA: Diagnosis not present

## 2014-12-27 DIAGNOSIS — I10 Essential (primary) hypertension: Secondary | ICD-10-CM | POA: Diagnosis not present

## 2014-12-30 DIAGNOSIS — Z79891 Long term (current) use of opiate analgesic: Secondary | ICD-10-CM | POA: Diagnosis not present

## 2014-12-30 DIAGNOSIS — Z7982 Long term (current) use of aspirin: Secondary | ICD-10-CM | POA: Diagnosis not present

## 2014-12-30 DIAGNOSIS — Z471 Aftercare following joint replacement surgery: Secondary | ICD-10-CM | POA: Diagnosis not present

## 2014-12-30 DIAGNOSIS — I10 Essential (primary) hypertension: Secondary | ICD-10-CM | POA: Diagnosis not present

## 2014-12-30 DIAGNOSIS — Z96652 Presence of left artificial knee joint: Secondary | ICD-10-CM | POA: Diagnosis not present

## 2014-12-30 DIAGNOSIS — R32 Unspecified urinary incontinence: Secondary | ICD-10-CM | POA: Diagnosis not present

## 2014-12-30 DIAGNOSIS — J45909 Unspecified asthma, uncomplicated: Secondary | ICD-10-CM | POA: Diagnosis not present

## 2014-12-30 DIAGNOSIS — G4733 Obstructive sleep apnea (adult) (pediatric): Secondary | ICD-10-CM | POA: Diagnosis not present

## 2014-12-30 DIAGNOSIS — F419 Anxiety disorder, unspecified: Secondary | ICD-10-CM | POA: Diagnosis not present

## 2014-12-30 DIAGNOSIS — D62 Acute posthemorrhagic anemia: Secondary | ICD-10-CM | POA: Diagnosis not present

## 2014-12-30 DIAGNOSIS — Z791 Long term (current) use of non-steroidal anti-inflammatories (NSAID): Secondary | ICD-10-CM | POA: Diagnosis not present

## 2014-12-30 DIAGNOSIS — N401 Enlarged prostate with lower urinary tract symptoms: Secondary | ICD-10-CM | POA: Diagnosis not present

## 2015-01-01 DIAGNOSIS — Z791 Long term (current) use of non-steroidal anti-inflammatories (NSAID): Secondary | ICD-10-CM | POA: Diagnosis not present

## 2015-01-01 DIAGNOSIS — J45909 Unspecified asthma, uncomplicated: Secondary | ICD-10-CM | POA: Diagnosis not present

## 2015-01-01 DIAGNOSIS — Z7982 Long term (current) use of aspirin: Secondary | ICD-10-CM | POA: Diagnosis not present

## 2015-01-01 DIAGNOSIS — G4733 Obstructive sleep apnea (adult) (pediatric): Secondary | ICD-10-CM | POA: Diagnosis not present

## 2015-01-01 DIAGNOSIS — Z79891 Long term (current) use of opiate analgesic: Secondary | ICD-10-CM | POA: Diagnosis not present

## 2015-01-01 DIAGNOSIS — Z96652 Presence of left artificial knee joint: Secondary | ICD-10-CM | POA: Diagnosis not present

## 2015-01-01 DIAGNOSIS — F419 Anxiety disorder, unspecified: Secondary | ICD-10-CM | POA: Diagnosis not present

## 2015-01-01 DIAGNOSIS — I10 Essential (primary) hypertension: Secondary | ICD-10-CM | POA: Diagnosis not present

## 2015-01-01 DIAGNOSIS — Z471 Aftercare following joint replacement surgery: Secondary | ICD-10-CM | POA: Diagnosis not present

## 2015-01-01 DIAGNOSIS — N401 Enlarged prostate with lower urinary tract symptoms: Secondary | ICD-10-CM | POA: Diagnosis not present

## 2015-01-01 DIAGNOSIS — R32 Unspecified urinary incontinence: Secondary | ICD-10-CM | POA: Diagnosis not present

## 2015-01-01 DIAGNOSIS — D62 Acute posthemorrhagic anemia: Secondary | ICD-10-CM | POA: Diagnosis not present

## 2015-01-03 DIAGNOSIS — N401 Enlarged prostate with lower urinary tract symptoms: Secondary | ICD-10-CM | POA: Diagnosis not present

## 2015-01-03 DIAGNOSIS — Z79891 Long term (current) use of opiate analgesic: Secondary | ICD-10-CM | POA: Diagnosis not present

## 2015-01-03 DIAGNOSIS — D62 Acute posthemorrhagic anemia: Secondary | ICD-10-CM | POA: Diagnosis not present

## 2015-01-03 DIAGNOSIS — Z471 Aftercare following joint replacement surgery: Secondary | ICD-10-CM | POA: Diagnosis not present

## 2015-01-03 DIAGNOSIS — Z7982 Long term (current) use of aspirin: Secondary | ICD-10-CM | POA: Diagnosis not present

## 2015-01-03 DIAGNOSIS — R32 Unspecified urinary incontinence: Secondary | ICD-10-CM | POA: Diagnosis not present

## 2015-01-03 DIAGNOSIS — Z791 Long term (current) use of non-steroidal anti-inflammatories (NSAID): Secondary | ICD-10-CM | POA: Diagnosis not present

## 2015-01-03 DIAGNOSIS — Z96652 Presence of left artificial knee joint: Secondary | ICD-10-CM | POA: Diagnosis not present

## 2015-01-03 DIAGNOSIS — F419 Anxiety disorder, unspecified: Secondary | ICD-10-CM | POA: Diagnosis not present

## 2015-01-03 DIAGNOSIS — G4733 Obstructive sleep apnea (adult) (pediatric): Secondary | ICD-10-CM | POA: Diagnosis not present

## 2015-01-03 DIAGNOSIS — J45909 Unspecified asthma, uncomplicated: Secondary | ICD-10-CM | POA: Diagnosis not present

## 2015-01-03 DIAGNOSIS — I10 Essential (primary) hypertension: Secondary | ICD-10-CM | POA: Diagnosis not present

## 2015-01-07 DIAGNOSIS — M25662 Stiffness of left knee, not elsewhere classified: Secondary | ICD-10-CM | POA: Diagnosis not present

## 2015-01-07 DIAGNOSIS — Z96652 Presence of left artificial knee joint: Secondary | ICD-10-CM | POA: Diagnosis not present

## 2015-01-07 DIAGNOSIS — M25562 Pain in left knee: Secondary | ICD-10-CM | POA: Diagnosis not present

## 2015-01-07 DIAGNOSIS — R2681 Unsteadiness on feet: Secondary | ICD-10-CM | POA: Diagnosis not present

## 2015-01-07 DIAGNOSIS — J301 Allergic rhinitis due to pollen: Secondary | ICD-10-CM | POA: Diagnosis not present

## 2015-01-07 DIAGNOSIS — Z9889 Other specified postprocedural states: Secondary | ICD-10-CM | POA: Diagnosis not present

## 2015-01-14 DIAGNOSIS — R2681 Unsteadiness on feet: Secondary | ICD-10-CM | POA: Diagnosis not present

## 2015-01-14 DIAGNOSIS — Z96652 Presence of left artificial knee joint: Secondary | ICD-10-CM | POA: Diagnosis not present

## 2015-01-14 DIAGNOSIS — M25562 Pain in left knee: Secondary | ICD-10-CM | POA: Diagnosis not present

## 2015-01-16 DIAGNOSIS — R262 Difficulty in walking, not elsewhere classified: Secondary | ICD-10-CM | POA: Diagnosis not present

## 2015-01-16 DIAGNOSIS — M25462 Effusion, left knee: Secondary | ICD-10-CM | POA: Diagnosis not present

## 2015-01-16 DIAGNOSIS — M6281 Muscle weakness (generalized): Secondary | ICD-10-CM | POA: Diagnosis not present

## 2015-01-16 DIAGNOSIS — J301 Allergic rhinitis due to pollen: Secondary | ICD-10-CM | POA: Diagnosis not present

## 2015-01-16 DIAGNOSIS — M25662 Stiffness of left knee, not elsewhere classified: Secondary | ICD-10-CM | POA: Diagnosis not present

## 2015-01-16 DIAGNOSIS — J3089 Other allergic rhinitis: Secondary | ICD-10-CM | POA: Diagnosis not present

## 2015-01-18 ENCOUNTER — Other Ambulatory Visit: Payer: Self-pay | Admitting: Family Medicine

## 2015-01-21 DIAGNOSIS — M25462 Effusion, left knee: Secondary | ICD-10-CM | POA: Diagnosis not present

## 2015-01-21 DIAGNOSIS — R262 Difficulty in walking, not elsewhere classified: Secondary | ICD-10-CM | POA: Diagnosis not present

## 2015-01-21 DIAGNOSIS — M6281 Muscle weakness (generalized): Secondary | ICD-10-CM | POA: Diagnosis not present

## 2015-01-21 DIAGNOSIS — M25662 Stiffness of left knee, not elsewhere classified: Secondary | ICD-10-CM | POA: Diagnosis not present

## 2015-01-23 DIAGNOSIS — M6281 Muscle weakness (generalized): Secondary | ICD-10-CM | POA: Diagnosis not present

## 2015-01-23 DIAGNOSIS — J3089 Other allergic rhinitis: Secondary | ICD-10-CM | POA: Diagnosis not present

## 2015-01-23 DIAGNOSIS — M25662 Stiffness of left knee, not elsewhere classified: Secondary | ICD-10-CM | POA: Diagnosis not present

## 2015-01-23 DIAGNOSIS — M25462 Effusion, left knee: Secondary | ICD-10-CM | POA: Diagnosis not present

## 2015-01-23 DIAGNOSIS — J301 Allergic rhinitis due to pollen: Secondary | ICD-10-CM | POA: Diagnosis not present

## 2015-01-23 DIAGNOSIS — R262 Difficulty in walking, not elsewhere classified: Secondary | ICD-10-CM | POA: Diagnosis not present

## 2015-01-29 DIAGNOSIS — R262 Difficulty in walking, not elsewhere classified: Secondary | ICD-10-CM | POA: Diagnosis not present

## 2015-01-29 DIAGNOSIS — M25462 Effusion, left knee: Secondary | ICD-10-CM | POA: Diagnosis not present

## 2015-01-29 DIAGNOSIS — M25662 Stiffness of left knee, not elsewhere classified: Secondary | ICD-10-CM | POA: Diagnosis not present

## 2015-01-29 DIAGNOSIS — M6281 Muscle weakness (generalized): Secondary | ICD-10-CM | POA: Diagnosis not present

## 2015-01-30 ENCOUNTER — Other Ambulatory Visit: Payer: Self-pay | Admitting: Family Medicine

## 2015-01-30 DIAGNOSIS — J301 Allergic rhinitis due to pollen: Secondary | ICD-10-CM | POA: Diagnosis not present

## 2015-01-30 DIAGNOSIS — J3089 Other allergic rhinitis: Secondary | ICD-10-CM | POA: Diagnosis not present

## 2015-02-04 DIAGNOSIS — J3089 Other allergic rhinitis: Secondary | ICD-10-CM | POA: Diagnosis not present

## 2015-02-04 DIAGNOSIS — M25462 Effusion, left knee: Secondary | ICD-10-CM | POA: Diagnosis not present

## 2015-02-04 DIAGNOSIS — J301 Allergic rhinitis due to pollen: Secondary | ICD-10-CM | POA: Diagnosis not present

## 2015-02-04 DIAGNOSIS — M25662 Stiffness of left knee, not elsewhere classified: Secondary | ICD-10-CM | POA: Diagnosis not present

## 2015-02-04 DIAGNOSIS — R262 Difficulty in walking, not elsewhere classified: Secondary | ICD-10-CM | POA: Diagnosis not present

## 2015-02-04 DIAGNOSIS — M6281 Muscle weakness (generalized): Secondary | ICD-10-CM | POA: Diagnosis not present

## 2015-02-12 ENCOUNTER — Other Ambulatory Visit (INDEPENDENT_AMBULATORY_CARE_PROVIDER_SITE_OTHER): Payer: Medicare Other

## 2015-02-12 DIAGNOSIS — M6281 Muscle weakness (generalized): Secondary | ICD-10-CM | POA: Diagnosis not present

## 2015-02-12 DIAGNOSIS — N400 Enlarged prostate without lower urinary tract symptoms: Secondary | ICD-10-CM

## 2015-02-12 DIAGNOSIS — Z0000 Encounter for general adult medical examination without abnormal findings: Secondary | ICD-10-CM

## 2015-02-12 DIAGNOSIS — M25462 Effusion, left knee: Secondary | ICD-10-CM | POA: Diagnosis not present

## 2015-02-12 DIAGNOSIS — E785 Hyperlipidemia, unspecified: Secondary | ICD-10-CM

## 2015-02-12 DIAGNOSIS — R262 Difficulty in walking, not elsewhere classified: Secondary | ICD-10-CM | POA: Diagnosis not present

## 2015-02-12 DIAGNOSIS — M25662 Stiffness of left knee, not elsewhere classified: Secondary | ICD-10-CM | POA: Diagnosis not present

## 2015-02-12 LAB — BASIC METABOLIC PANEL
BUN: 19 mg/dL (ref 6–23)
CALCIUM: 11.7 mg/dL — AB (ref 8.4–10.5)
CO2: 29 mEq/L (ref 19–32)
Chloride: 108 mEq/L (ref 96–112)
Creatinine, Ser: 1.14 mg/dL (ref 0.40–1.50)
GFR: 80.71 mL/min (ref >60.00)
GLUCOSE: 95 mg/dL (ref 70–99)
Potassium: 4.8 mEq/L (ref 3.5–5.1)
Sodium: 143 mEq/L (ref 135–145)

## 2015-02-12 LAB — CBC WITH DIFFERENTIAL/PLATELET
Basophils Absolute: 0 10*3/uL (ref 0.0–0.1)
Basophils Relative: 0.5 % (ref 0.0–3.0)
Eosinophils Absolute: 0.3 10*3/uL (ref 0.0–0.7)
Eosinophils Relative: 4.3 % (ref 0.0–5.0)
HCT: 33.6 % — ABNORMAL LOW (ref 39.0–52.0)
Hemoglobin: 11 g/dL — ABNORMAL LOW (ref 13.0–17.0)
Lymphocytes Relative: 31.4 % (ref 12.0–46.0)
Lymphs Abs: 2.3 10*3/uL (ref 0.7–4.0)
MCHC: 32.7 g/dL (ref 30.0–36.0)
MCV: 91.3 fl (ref 78.0–100.0)
Monocytes Absolute: 0.6 10*3/uL (ref 0.1–1.0)
Monocytes Relative: 8.1 % (ref 3.0–12.0)
Neutro Abs: 4.1 10*3/uL (ref 1.4–7.7)
Neutrophils Relative %: 55.7 % (ref 43.0–77.0)
Platelets: 356 10*3/uL (ref 150.0–400.0)
RBC: 3.68 Mil/uL — ABNORMAL LOW (ref 4.22–5.81)
RDW: 15.9 % — ABNORMAL HIGH (ref 11.5–15.5)
WBC: 7.4 10*3/uL (ref 4.0–10.5)

## 2015-02-12 LAB — HEPATIC FUNCTION PANEL
ALK PHOS: 59 U/L (ref 39–117)
ALT: 27 U/L (ref 0–53)
AST: 26 U/L (ref 0–37)
Albumin: 3.7 g/dL (ref 3.5–5.2)
BILIRUBIN TOTAL: 0.4 mg/dL (ref 0.2–1.2)
Bilirubin, Direct: 0.1 mg/dL (ref 0.0–0.3)
Total Protein: 6.7 g/dL (ref 6.0–8.3)

## 2015-02-12 LAB — POCT URINALYSIS DIPSTICK
Bilirubin, UA: NEGATIVE
GLUCOSE UA: NEGATIVE
Ketones, UA: NEGATIVE
NITRITE UA: NEGATIVE
Protein, UA: NEGATIVE
SPEC GRAV UA: 1.02
UROBILINOGEN UA: 0.2
pH, UA: 6

## 2015-02-12 LAB — LIPID PANEL
Cholesterol: 162 mg/dL (ref 0–200)
HDL: 65.7 mg/dL (ref >39.00)
LDL Cholesterol: 76 mg/dL (ref 0–99)
NonHDL: 95.84
Total CHOL/HDL Ratio: 2
Triglycerides: 101 mg/dL (ref 0.0–149.0)
VLDL: 20.2 mg/dL (ref 0.0–40.0)

## 2015-02-12 LAB — TSH: TSH: 0.97 u[IU]/mL (ref 0.35–4.50)

## 2015-02-12 LAB — PSA: PSA: 1.04 ng/mL (ref 0.10–4.00)

## 2015-02-13 DIAGNOSIS — J3089 Other allergic rhinitis: Secondary | ICD-10-CM | POA: Diagnosis not present

## 2015-02-13 DIAGNOSIS — J301 Allergic rhinitis due to pollen: Secondary | ICD-10-CM | POA: Diagnosis not present

## 2015-02-13 MED ORDER — CIPROFLOXACIN HCL 500 MG PO TABS: 500.0000 mg | ORAL_TABLET | Freq: Two times a day (BID) | ORAL | Status: DC

## 2015-02-19 ENCOUNTER — Ambulatory Visit (INDEPENDENT_AMBULATORY_CARE_PROVIDER_SITE_OTHER): Payer: Medicare Other | Admitting: Family Medicine

## 2015-02-19 ENCOUNTER — Encounter: Payer: Self-pay | Admitting: Family Medicine

## 2015-02-19 VITALS — BP 150/84 | Temp 98.8°F | Ht 72.0 in | Wt 239.9 lb

## 2015-02-19 DIAGNOSIS — Z23 Encounter for immunization: Secondary | ICD-10-CM

## 2015-02-19 DIAGNOSIS — Z0000 Encounter for general adult medical examination without abnormal findings: Secondary | ICD-10-CM | POA: Diagnosis not present

## 2015-02-19 MED ORDER — OMEPRAZOLE 20 MG PO CPDR: 20.0000 mg | DELAYED_RELEASE_CAPSULE | Freq: Every day | ORAL | Status: DC

## 2015-02-19 MED ORDER — BENAZEPRIL HCL 10 MG PO TABS: 10.0000 mg | ORAL_TABLET | Freq: Every day | ORAL | Status: DC

## 2015-02-19 MED ORDER — TADALAFIL 5 MG PO TABS: ORAL_TABLET | ORAL | Status: DC

## 2015-02-19 MED ORDER — AMLODIPINE BESYLATE 10 MG PO TABS: ORAL_TABLET | ORAL | Status: DC

## 2015-02-19 MED ORDER — METOPROLOL TARTRATE 50 MG PO TABS: 50.0000 mg | ORAL_TABLET | Freq: Two times a day (BID) | ORAL | Status: DC

## 2015-02-19 MED ORDER — TAMSULOSIN HCL 0.4 MG PO CAPS: ORAL_CAPSULE | ORAL | Status: DC

## 2015-02-19 NOTE — Addendum Note (Signed)
Addended by: Ailene Rud E on: 02/19/2015 11:44 AM   Modules accepted: Orders

## 2015-02-19 NOTE — Progress Notes (Signed)
Pre visit review using our clinic review tool, if applicable. No additional management support is needed unless otherwise documented below in the visit note. 

## 2015-02-19 NOTE — Progress Notes (Signed)
   Subjective:    Patient ID: [name] [name], male    DOB: 09-09-1940, 75 y.o.   MRN: [id number]  HPI 75 yr old male for a cpx. He feels well. He is recovering from knee replacement surgery. He has been following his BP at home and it was running a little low with values around 110/60, so he stopped taking Benazepril about a month ago. Since then his BP has been a little high around 160/90.    Review of Systems  Constitutional: Negative.   HENT: Negative.   Eyes: Negative.   Respiratory: Negative.   Cardiovascular: Negative.   Gastrointestinal: Negative.   Genitourinary: Negative.   Musculoskeletal: Negative.   Skin: Negative.   Neurological: Negative.   Psychiatric/Behavioral: Negative.        Objective:   Physical Exam  Constitutional: He is oriented to person, place, and time. He appears well-developed and well-nourished. No distress.  HENT:  Head: Normocephalic and atraumatic.  Right Ear: External ear normal.  Left Ear: External ear normal.  Nose: Nose normal.  Mouth/Throat: Oropharynx is clear and moist. No oropharyngeal exudate.  Eyes: Conjunctivae and EOM are normal. Pupils are equal, round, and reactive to light. Right eye exhibits no discharge. Left eye exhibits no discharge. No scleral icterus.  Neck: Neck supple. No JVD present. No tracheal deviation present. No thyromegaly present.  Cardiovascular: Normal rate, regular rhythm, normal heart sounds and intact distal pulses.  Exam reveals no gallop and no friction rub.   No murmur heard. Pulmonary/Chest: Effort normal and breath sounds normal. No respiratory distress. He has no wheezes. He has no rales. He exhibits no tenderness.  Abdominal: Soft. Bowel sounds are normal. He exhibits no distension and no mass. There is no tenderness. There is no rebound and no guarding.  Genitourinary: Rectum normal, prostate normal and penis normal. Guaiac negative stool. No penile tenderness.  Musculoskeletal: Normal range of motion.  He exhibits no edema or tenderness.  Lymphadenopathy:    He has no cervical adenopathy.  Neurological: He is alert and oriented to person, place, and time. He has normal reflexes. No cranial nerve deficit. He exhibits normal muscle tone. Coordination normal.  Skin: Skin is warm and dry. No rash noted. He is not diaphoretic. No erythema. No pallor.  Psychiatric: He has a normal mood and affect. His behavior is normal. Judgment and thought content normal.          Assessment & Plan:  Well exam. For his HTN we will start him back on Benazepril but will reduce the dose to 10 mg daily. We discussed diet and exercise.

## 2015-02-20 DIAGNOSIS — M25562 Pain in left knee: Secondary | ICD-10-CM | POA: Diagnosis not present

## 2015-02-27 DIAGNOSIS — J3089 Other allergic rhinitis: Secondary | ICD-10-CM | POA: Diagnosis not present

## 2015-02-27 DIAGNOSIS — J301 Allergic rhinitis due to pollen: Secondary | ICD-10-CM | POA: Diagnosis not present

## 2015-03-05 DIAGNOSIS — G4733 Obstructive sleep apnea (adult) (pediatric): Secondary | ICD-10-CM | POA: Diagnosis not present

## 2015-03-07 DIAGNOSIS — J301 Allergic rhinitis due to pollen: Secondary | ICD-10-CM | POA: Diagnosis not present

## 2015-03-07 DIAGNOSIS — J3089 Other allergic rhinitis: Secondary | ICD-10-CM | POA: Diagnosis not present

## 2015-03-13 DIAGNOSIS — J301 Allergic rhinitis due to pollen: Secondary | ICD-10-CM | POA: Diagnosis not present

## 2015-03-13 DIAGNOSIS — J3089 Other allergic rhinitis: Secondary | ICD-10-CM | POA: Diagnosis not present

## 2015-03-13 DIAGNOSIS — H1045 Other chronic allergic conjunctivitis: Secondary | ICD-10-CM | POA: Diagnosis not present

## 2015-03-14 ENCOUNTER — Encounter: Payer: Self-pay | Admitting: Sports Medicine

## 2015-03-14 ENCOUNTER — Ambulatory Visit (INDEPENDENT_AMBULATORY_CARE_PROVIDER_SITE_OTHER): Payer: Medicare Other | Admitting: Sports Medicine

## 2015-03-14 DIAGNOSIS — B351 Tinea unguium: Secondary | ICD-10-CM | POA: Diagnosis not present

## 2015-03-14 DIAGNOSIS — M79676 Pain in unspecified toe(s): Secondary | ICD-10-CM | POA: Diagnosis not present

## 2015-03-14 DIAGNOSIS — L84 Corns and callosities: Secondary | ICD-10-CM | POA: Diagnosis not present

## 2015-03-14 NOTE — Progress Notes (Signed)
Patient ID: [name] [name], male   DOB: December 17, 1940, 75 y.o.   MRN: [id number]  Subjective: [name] [name] is a 75 y.o. male patient seen today in office with complaint of thickened and elongated toenails; unable to trim. Patient denies history of Diabetes, Neuropathy, or Vascular disease. Patient has no other pedal complaints at this time.   Patient Active Problem List   Diagnosis Date Noted  . Arthritis of knee 12/23/2014  . Primary osteoarthritis of left knee 12/21/2014  . OSA (obstructive sleep apnea) 05/27/2011  . PRIMARY HYPERPARATHYROIDISM 12/25/2009  . KNEE PAIN, LEFT 12/25/2009  . PLANTAR FASCIITIS 12/16/2009  . ACUTE PROSTATITIS 01/14/2009  . ANEMIA-IRON DEFICIENCY 01/09/2008  . ACUTE SINUSITIS, UNSPECIFIED 07/31/2007  . HYPERLIPIDEMIA 12/16/2006  . ANXIETY 12/16/2006  . ERECTILE DYSFUNCTION 12/16/2006  . HYPERTENSION 12/16/2006  . ALLERGIC RHINITIS 12/16/2006  . ASTHMA 12/16/2006  . GERD 12/16/2006  . COLONIC POLYPS, HX OF 12/16/2006  . BENIGN PROSTATIC HYPERTROPHY, HX OF 12/16/2006  . PARONYCHIA, FINGER 10/11/2006    Current Outpatient Prescriptions on File Prior to Visit  Medication Sig Dispense Refill  . alendronate (FOSAMAX) 70 MG tablet TAKE 1 TABLET BY MOUTH EVERY 7 DAYS. TAKE WITH FULL GLASS OF WATER ON AN EMPTY STOMACH 12 tablet 3  . amLODipine (NORVASC) 10 MG tablet TAKE 1 TABLET (10 MG TOTAL) BY MOUTH DAILY. 90 tablet 3  . aspirin 81 MG tablet Take 81 mg by mouth daily as needed for pain.     Marland Kitchen aspirin EC 325 MG tablet Take 1 tablet (325 mg total) by mouth 2 (two) times daily. 30 tablet 0  . benazepril (LOTENSIN) 10 MG tablet Take 1 tablet (10 mg total) by mouth daily. 90 tablet 3  . cetirizine (ZYRTEC) 10 MG tablet Take 10 mg by mouth daily.     . fish oil-omega-3 fatty acids 1000 MG capsule Take 2 g by mouth daily.      . meloxicam (MOBIC) 15 MG tablet Take 15 mg by mouth daily.  2  . metoprolol (LOPRESSOR) 50 MG tablet Take 1 tablet (50 mg total) by mouth  2 (two) times daily. 180 tablet 3  . mirtazapine (REMERON) 15 MG tablet Take 15 mg by mouth at bedtime. Reported on 02/19/2015  5  . montelukast (SINGULAIR) 10 MG tablet Take 10 mg by mouth every evening.  3  . Multiple Vitamin (MULTIVITAMIN) tablet Take 1 tablet by mouth daily.    Marland Kitchen OLANZapine (ZYPREXA) 10 MG tablet Take 10 mg by mouth daily. Reported on 02/19/2015  1  . omeprazole (PRILOSEC) 20 MG capsule Take 1 capsule (20 mg total) by mouth daily. 90 capsule 3  . oxyCODONE-acetaminophen (ROXICET) 5-325 MG tablet Take 1 tablet by mouth every 4 (four) hours as needed. (Patient not taking: Reported on 02/19/2015) 60 tablet 0  . sertraline (ZOLOFT) 25 MG tablet Take 1 tablet (25 mg total) by mouth daily. 90 tablet 3  . simvastatin (ZOCOR) 20 MG tablet TAKE 1 TABLET (20 MG TOTAL) BY MOUTH DAILY. 90 tablet 1  . tadalafil (CIALIS) 5 MG tablet Once daily for BPH 90 tablet 3  . tamsulosin (FLOMAX) 0.4 MG CAPS capsule TAKE 1 CAPSULE (0.4 MG TOTAL) BY MOUTH DAILY. 90 capsule 3  . tiZANidine (ZANAFLEX) 2 MG tablet Take 1 tablet (2 mg total) by mouth every 6 (six) hours as needed for muscle spasms. 60 tablet 0  . traMADol (ULTRAM) 50 MG tablet Take 50 mg by mouth every 6 (six) hours as needed. Reported  on 02/19/2015  0  . [DISCONTINUED] amLODipine-benazepril (LOTREL) 10-20 MG per capsule Take 1 capsule by mouth daily. 30 capsule 11   No current facility-administered medications on file prior to visit.   No Known Allergies   Objective: Physical Exam  General: Well developed, nourished, no acute distress, awake, alert and oriented x 3  Vascular: Dorsalis pedis artery 2/4 bilateral, Posterior tibial artery 2/4 bilateral, skin temperature warm to warm proximal to distal bilateral lower extremities, no varicosities, pedal hair present bilateral.  Neurological: Epicritic sensation intact via 5.07 Semmes Weinstein at all pedal sites, vibratory sensation intact bilateral.  Dermatological: Skin is warm, dry, and  supple bilateral, Nails 1-10 are tender, long, thick, and discolored with mild subungal debris, no webspace macerations present bilateral, no open lesions present bilateral, Hyperkeratotic tissue present sub 1st & 5th metatarsal heads on right and sub 3rd metatarsal on left with no signs of infection bilateral.  Musculoskeletal: Asymptomatic bunion and hammertes bilateral. Muscular strength within normal limits without pain or limitation on range of motion. No pain with calf compression bilateral.  Assessment and Plan:  Problem List Items Addressed This Visit    None    Visit Diagnoses    Dermatophytosis of nail    -  Primary    Corns and callosities        Pain of toe, unspecified laterality          -Examination performed. -Discussed treatment options for painful mycotic nails & callus skin. -Mechanically debrided and reduced mycotic nails with sterile nail nipper and dremel nail file without incident. -Debrided callouses x 3 using sterile chisel blade without incident. -Recommend skin emollient for dry calloused skin.  -Recommend cont. With good supportive shoes daily -Patient to return in 3 months for follow up evaluation or sooner if symptoms worsen.  Landis Martins, DPM

## 2015-03-21 DIAGNOSIS — J3089 Other allergic rhinitis: Secondary | ICD-10-CM | POA: Diagnosis not present

## 2015-03-21 DIAGNOSIS — J301 Allergic rhinitis due to pollen: Secondary | ICD-10-CM | POA: Diagnosis not present

## 2015-03-25 ENCOUNTER — Other Ambulatory Visit: Payer: Self-pay | Admitting: Family Medicine

## 2015-04-01 DIAGNOSIS — J3089 Other allergic rhinitis: Secondary | ICD-10-CM | POA: Diagnosis not present

## 2015-04-01 DIAGNOSIS — J301 Allergic rhinitis due to pollen: Secondary | ICD-10-CM | POA: Diagnosis not present

## 2015-04-10 DIAGNOSIS — J301 Allergic rhinitis due to pollen: Secondary | ICD-10-CM | POA: Diagnosis not present

## 2015-04-10 DIAGNOSIS — J3089 Other allergic rhinitis: Secondary | ICD-10-CM | POA: Diagnosis not present

## 2015-04-18 DIAGNOSIS — J3089 Other allergic rhinitis: Secondary | ICD-10-CM | POA: Diagnosis not present

## 2015-04-18 DIAGNOSIS — J301 Allergic rhinitis due to pollen: Secondary | ICD-10-CM | POA: Diagnosis not present

## 2015-04-21 ENCOUNTER — Other Ambulatory Visit: Payer: Self-pay | Admitting: Family Medicine

## 2015-04-22 DIAGNOSIS — J3089 Other allergic rhinitis: Secondary | ICD-10-CM | POA: Diagnosis not present

## 2015-04-22 DIAGNOSIS — J301 Allergic rhinitis due to pollen: Secondary | ICD-10-CM | POA: Diagnosis not present

## 2015-05-06 DIAGNOSIS — J301 Allergic rhinitis due to pollen: Secondary | ICD-10-CM | POA: Diagnosis not present

## 2015-05-06 DIAGNOSIS — J3089 Other allergic rhinitis: Secondary | ICD-10-CM | POA: Diagnosis not present

## 2015-05-08 DIAGNOSIS — J3089 Other allergic rhinitis: Secondary | ICD-10-CM | POA: Diagnosis not present

## 2015-05-08 DIAGNOSIS — J301 Allergic rhinitis due to pollen: Secondary | ICD-10-CM | POA: Diagnosis not present

## 2015-05-20 DIAGNOSIS — J3089 Other allergic rhinitis: Secondary | ICD-10-CM | POA: Diagnosis not present

## 2015-05-20 DIAGNOSIS — J301 Allergic rhinitis due to pollen: Secondary | ICD-10-CM | POA: Diagnosis not present

## 2015-05-29 DIAGNOSIS — J3089 Other allergic rhinitis: Secondary | ICD-10-CM | POA: Diagnosis not present

## 2015-05-29 DIAGNOSIS — J301 Allergic rhinitis due to pollen: Secondary | ICD-10-CM | POA: Diagnosis not present

## 2015-06-03 DIAGNOSIS — J3089 Other allergic rhinitis: Secondary | ICD-10-CM | POA: Diagnosis not present

## 2015-06-03 DIAGNOSIS — J301 Allergic rhinitis due to pollen: Secondary | ICD-10-CM | POA: Diagnosis not present

## 2015-06-05 DIAGNOSIS — J301 Allergic rhinitis due to pollen: Secondary | ICD-10-CM | POA: Diagnosis not present

## 2015-06-05 DIAGNOSIS — J3089 Other allergic rhinitis: Secondary | ICD-10-CM | POA: Diagnosis not present

## 2015-06-06 DIAGNOSIS — G4733 Obstructive sleep apnea (adult) (pediatric): Secondary | ICD-10-CM | POA: Diagnosis not present

## 2015-06-10 DIAGNOSIS — J3089 Other allergic rhinitis: Secondary | ICD-10-CM | POA: Diagnosis not present

## 2015-06-10 DIAGNOSIS — J301 Allergic rhinitis due to pollen: Secondary | ICD-10-CM | POA: Diagnosis not present

## 2015-06-13 ENCOUNTER — Encounter: Payer: Self-pay | Admitting: Sports Medicine

## 2015-06-13 ENCOUNTER — Ambulatory Visit (INDEPENDENT_AMBULATORY_CARE_PROVIDER_SITE_OTHER): Payer: Medicare Other | Admitting: Sports Medicine

## 2015-06-13 DIAGNOSIS — L84 Corns and callosities: Secondary | ICD-10-CM

## 2015-06-13 DIAGNOSIS — J3089 Other allergic rhinitis: Secondary | ICD-10-CM | POA: Diagnosis not present

## 2015-06-13 DIAGNOSIS — J301 Allergic rhinitis due to pollen: Secondary | ICD-10-CM | POA: Diagnosis not present

## 2015-06-13 DIAGNOSIS — M79676 Pain in unspecified toe(s): Secondary | ICD-10-CM

## 2015-06-13 DIAGNOSIS — B351 Tinea unguium: Secondary | ICD-10-CM | POA: Diagnosis not present

## 2015-06-13 NOTE — Progress Notes (Signed)
Patient ID: [name] [name], male   DOB: 08-Dec-1940, 75 y.o.   MRN: [id number] Subjective: [name] [name] is a 75 y.o. male patient seen today in office with complaint of callus and thickened and elongated toenails; unable to trim. Patient denies any changes with medical history since last visit. Patient has no other pedal complaints at this time.   Patient Active Problem List   Diagnosis Date Noted  . Arthritis of knee 12/23/2014  . Primary osteoarthritis of left knee 12/21/2014  . OSA (obstructive sleep apnea) 05/27/2011  . PRIMARY HYPERPARATHYROIDISM 12/25/2009  . KNEE PAIN, LEFT 12/25/2009  . PLANTAR FASCIITIS 12/16/2009  . ACUTE PROSTATITIS 01/14/2009  . ANEMIA-IRON DEFICIENCY 01/09/2008  . ACUTE SINUSITIS, UNSPECIFIED 07/31/2007  . HYPERLIPIDEMIA 12/16/2006  . ANXIETY 12/16/2006  . ERECTILE DYSFUNCTION 12/16/2006  . HYPERTENSION 12/16/2006  . ALLERGIC RHINITIS 12/16/2006  . ASTHMA 12/16/2006  . GERD 12/16/2006  . COLONIC POLYPS, HX OF 12/16/2006  . BENIGN PROSTATIC HYPERTROPHY, HX OF 12/16/2006  . PARONYCHIA, FINGER 10/11/2006    Current Outpatient Prescriptions on File Prior to Visit  Medication Sig Dispense Refill  . alendronate (FOSAMAX) 70 MG tablet TAKE 1 TABLET BY MOUTH EVERY 7 DAYS. TAKE WITH FULL GLASS OF WATER ON AN EMPTY STOMACH 12 tablet 3  . amLODipine (NORVASC) 10 MG tablet TAKE 1 TABLET (10 MG TOTAL) BY MOUTH DAILY. 90 tablet 3  . amLODipine (NORVASC) 10 MG tablet TAKE 1 TABLET (10 MG TOTAL) BY MOUTH DAILY. 90 tablet 3  . aspirin 81 MG tablet Take 81 mg by mouth daily as needed for pain.     Marland Kitchen aspirin EC 325 MG tablet Take 1 tablet (325 mg total) by mouth 2 (two) times daily. 30 tablet 0  . benazepril (LOTENSIN) 10 MG tablet Take 1 tablet (10 mg total) by mouth daily. 90 tablet 3  . cetirizine (ZYRTEC) 10 MG tablet Take 10 mg by mouth daily.     . fish oil-omega-3 fatty acids 1000 MG capsule Take 2 g by mouth daily.      . meloxicam (MOBIC) 15 MG tablet Take 15  mg by mouth daily.  2  . metoprolol (LOPRESSOR) 50 MG tablet Take 1 tablet (50 mg total) by mouth 2 (two) times daily. 180 tablet 3  . mirtazapine (REMERON) 15 MG tablet Take 15 mg by mouth at bedtime. Reported on 02/19/2015  5  . montelukast (SINGULAIR) 10 MG tablet Take 10 mg by mouth every evening.  3  . Multiple Vitamin (MULTIVITAMIN) tablet Take 1 tablet by mouth daily.    Marland Kitchen OLANZapine (ZYPREXA) 10 MG tablet Take 10 mg by mouth daily. Reported on 02/19/2015  1  . omeprazole (PRILOSEC) 20 MG capsule Take 1 capsule (20 mg total) by mouth daily. 90 capsule 3  . oxyCODONE-acetaminophen (ROXICET) 5-325 MG tablet Take 1 tablet by mouth every 4 (four) hours as needed. (Patient not taking: Reported on 02/19/2015) 60 tablet 0  . sertraline (ZOLOFT) 25 MG tablet TAKE 1 TABLET (25 MG TOTAL) BY MOUTH DAILY. 90 tablet 3  . simvastatin (ZOCOR) 20 MG tablet TAKE 1 TABLET (20 MG TOTAL) BY MOUTH DAILY. 90 tablet 1  . tadalafil (CIALIS) 5 MG tablet Once daily for BPH 90 tablet 3  . tamsulosin (FLOMAX) 0.4 MG CAPS capsule TAKE 1 CAPSULE (0.4 MG TOTAL) BY MOUTH DAILY. 90 capsule 3  . tiZANidine (ZANAFLEX) 2 MG tablet Take 1 tablet (2 mg total) by mouth every 6 (six) hours as needed for muscle spasms. [place]  tablet 0  . traMADol (ULTRAM) 50 MG tablet Take 50 mg by mouth every 6 (six) hours as needed. Reported on 02/19/2015  0  . [DISCONTINUED] amLODipine-benazepril (LOTREL) 10-20 MG per capsule Take 1 capsule by mouth daily. 30 capsule 11   No current facility-administered medications on file prior to visit.   No Known Allergies   Objective: Physical Exam  General: Well developed, nourished, no acute distress, awake, alert and oriented x 3  Vascular: Dorsalis pedis artery 2/4 bilateral, Posterior tibial artery 2/4 bilateral, skin temperature warm to warm proximal to distal bilateral lower extremities, no varicosities, pedal hair present bilateral.  Neurological: Epicritic sensation intact via 5.07 Semmes Weinstein  at all pedal sites, vibratory sensation intact bilateral.  Dermatological: Skin is warm, dry, and supple bilateral, Nails 1-10 are tender, long, thick, and discolored with mild subungal debris, no webspace macerations present bilateral, no open lesions present bilateral, Hyperkeratotic tissue present sub 1st & 5th metatarsal heads on right and sub 3rd metatarsal on left with no signs of infection bilateral.  Musculoskeletal: Asymptomatic bunion and hammertes bilateral. Muscular strength within normal limits without pain or limitation on range of motion. No pain with calf compression bilateral.  Assessment and Plan:  Problem List Items Addressed This Visit    None    Visit Diagnoses    Dermatophytosis of nail    -  Primary    Corns and callosities        Pain of toe, unspecified laterality          -Examination performed. -Discussed treatment options for painful mycotic nails & callus skin. -Mechanically debrided and reduced mycotic nails with sterile nail nipper and dremel nail file without incident. -Debrided callouses x 3 using sterile chisel blade without incident. -Recommend cont with skin emollient for dry calloused skin.  -Recommend cont with good supportive shoes daily -Patient to return in 3 months for follow up evaluation or sooner if symptoms worsen.  Landis Martins, DPM

## 2015-06-24 DIAGNOSIS — Z96652 Presence of left artificial knee joint: Secondary | ICD-10-CM | POA: Diagnosis not present

## 2015-06-24 DIAGNOSIS — M1711 Unilateral primary osteoarthritis, right knee: Secondary | ICD-10-CM | POA: Diagnosis not present

## 2015-06-24 DIAGNOSIS — Z09 Encounter for follow-up examination after completed treatment for conditions other than malignant neoplasm: Secondary | ICD-10-CM | POA: Diagnosis not present

## 2015-06-26 DIAGNOSIS — J3089 Other allergic rhinitis: Secondary | ICD-10-CM | POA: Diagnosis not present

## 2015-06-26 DIAGNOSIS — J301 Allergic rhinitis due to pollen: Secondary | ICD-10-CM | POA: Diagnosis not present

## 2015-07-01 DIAGNOSIS — M1711 Unilateral primary osteoarthritis, right knee: Secondary | ICD-10-CM | POA: Diagnosis not present

## 2015-07-04 DIAGNOSIS — J301 Allergic rhinitis due to pollen: Secondary | ICD-10-CM | POA: Diagnosis not present

## 2015-07-04 DIAGNOSIS — J3089 Other allergic rhinitis: Secondary | ICD-10-CM | POA: Diagnosis not present

## 2015-07-08 DIAGNOSIS — M1711 Unilateral primary osteoarthritis, right knee: Secondary | ICD-10-CM | POA: Diagnosis not present

## 2015-07-10 DIAGNOSIS — J301 Allergic rhinitis due to pollen: Secondary | ICD-10-CM | POA: Diagnosis not present

## 2015-07-10 DIAGNOSIS — J3089 Other allergic rhinitis: Secondary | ICD-10-CM | POA: Diagnosis not present

## 2015-07-16 DIAGNOSIS — M1711 Unilateral primary osteoarthritis, right knee: Secondary | ICD-10-CM | POA: Diagnosis not present

## 2015-07-17 DIAGNOSIS — J301 Allergic rhinitis due to pollen: Secondary | ICD-10-CM | POA: Diagnosis not present

## 2015-07-17 DIAGNOSIS — J3089 Other allergic rhinitis: Secondary | ICD-10-CM | POA: Diagnosis not present

## 2015-07-21 ENCOUNTER — Other Ambulatory Visit: Payer: Self-pay | Admitting: Family Medicine

## 2015-07-23 DIAGNOSIS — M1711 Unilateral primary osteoarthritis, right knee: Secondary | ICD-10-CM | POA: Diagnosis not present

## 2015-07-25 DIAGNOSIS — J301 Allergic rhinitis due to pollen: Secondary | ICD-10-CM | POA: Diagnosis not present

## 2015-07-25 DIAGNOSIS — J3089 Other allergic rhinitis: Secondary | ICD-10-CM | POA: Diagnosis not present

## 2015-07-29 ENCOUNTER — Other Ambulatory Visit: Payer: Self-pay | Admitting: Family Medicine

## 2015-08-07 DIAGNOSIS — J301 Allergic rhinitis due to pollen: Secondary | ICD-10-CM | POA: Diagnosis not present

## 2015-08-07 DIAGNOSIS — J3089 Other allergic rhinitis: Secondary | ICD-10-CM | POA: Diagnosis not present

## 2015-08-21 DIAGNOSIS — J3089 Other allergic rhinitis: Secondary | ICD-10-CM | POA: Diagnosis not present

## 2015-08-21 DIAGNOSIS — J301 Allergic rhinitis due to pollen: Secondary | ICD-10-CM | POA: Diagnosis not present

## 2015-09-04 DIAGNOSIS — J301 Allergic rhinitis due to pollen: Secondary | ICD-10-CM | POA: Diagnosis not present

## 2015-09-04 DIAGNOSIS — J3089 Other allergic rhinitis: Secondary | ICD-10-CM | POA: Diagnosis not present

## 2015-09-09 DIAGNOSIS — J3089 Other allergic rhinitis: Secondary | ICD-10-CM | POA: Diagnosis not present

## 2015-09-09 DIAGNOSIS — J301 Allergic rhinitis due to pollen: Secondary | ICD-10-CM | POA: Diagnosis not present

## 2015-09-09 DIAGNOSIS — M1711 Unilateral primary osteoarthritis, right knee: Secondary | ICD-10-CM | POA: Diagnosis not present

## 2015-09-12 ENCOUNTER — Encounter: Payer: Self-pay | Admitting: Sports Medicine

## 2015-09-12 ENCOUNTER — Ambulatory Visit (INDEPENDENT_AMBULATORY_CARE_PROVIDER_SITE_OTHER): Payer: Medicare Other | Admitting: Sports Medicine

## 2015-09-12 DIAGNOSIS — L84 Corns and callosities: Secondary | ICD-10-CM

## 2015-09-12 DIAGNOSIS — M79676 Pain in unspecified toe(s): Secondary | ICD-10-CM

## 2015-09-12 DIAGNOSIS — B351 Tinea unguium: Secondary | ICD-10-CM

## 2015-09-12 NOTE — Progress Notes (Signed)
Patient ID: [name] [name], male   DOB: 03/17/1940, 75 y.o.   MRN: [id number] Subjective: [name] [name] is a 75 y.o. male patient seen today in office with complaint of callus and thickened and elongated toenails; unable to trim. Patient denies any changes with medical history since last visit. Patient has no other pedal complaints at this time.   Patient Active Problem List   Diagnosis Date Noted  . Arthritis of knee 12/23/2014  . Primary osteoarthritis of left knee 12/21/2014  . OSA (obstructive sleep apnea) 05/27/2011  . PRIMARY HYPERPARATHYROIDISM 12/25/2009  . KNEE PAIN, LEFT 12/25/2009  . PLANTAR FASCIITIS 12/16/2009  . ACUTE PROSTATITIS 01/14/2009  . ANEMIA-IRON DEFICIENCY 01/09/2008  . ACUTE SINUSITIS, UNSPECIFIED 07/31/2007  . HYPERLIPIDEMIA 12/16/2006  . ANXIETY 12/16/2006  . ERECTILE DYSFUNCTION 12/16/2006  . HYPERTENSION 12/16/2006  . ALLERGIC RHINITIS 12/16/2006  . ASTHMA 12/16/2006  . GERD 12/16/2006  . COLONIC POLYPS, HX OF 12/16/2006  . BENIGN PROSTATIC HYPERTROPHY, HX OF 12/16/2006  . PARONYCHIA, FINGER 10/11/2006    Current Outpatient Prescriptions on File Prior to Visit  Medication Sig Dispense Refill  . alendronate (FOSAMAX) 70 MG tablet TAKE 1 TABLET BY MOUTH EVERY 7 DAYS. TAKE WITH FULL GLASS OF WATER ON AN EMPTY STOMACH 12 tablet 3  . amLODipine (NORVASC) 10 MG tablet TAKE 1 TABLET (10 MG TOTAL) BY MOUTH DAILY. 90 tablet 3  . amLODipine (NORVASC) 10 MG tablet TAKE 1 TABLET (10 MG TOTAL) BY MOUTH DAILY. 90 tablet 3  . aspirin 81 MG tablet Take 81 mg by mouth daily as needed for pain.     Marland Kitchen aspirin EC 325 MG tablet Take 1 tablet (325 mg total) by mouth 2 (two) times daily. 30 tablet 0  . benazepril (LOTENSIN) 10 MG tablet Take 1 tablet (10 mg total) by mouth daily. 90 tablet 3  . cetirizine (ZYRTEC) 10 MG tablet Take 10 mg by mouth daily.     . fish oil-omega-3 fatty acids 1000 MG capsule Take 2 g by mouth daily.      . meloxicam (MOBIC) 15 MG tablet Take 15  mg by mouth daily.  2  . metoprolol (LOPRESSOR) 50 MG tablet Take 1 tablet (50 mg total) by mouth 2 (two) times daily. 180 tablet 3  . mirtazapine (REMERON) 15 MG tablet Take 15 mg by mouth at bedtime. Reported on 02/19/2015  5  . montelukast (SINGULAIR) 10 MG tablet Take 10 mg by mouth every evening.  3  . Multiple Vitamin (MULTIVITAMIN) tablet Take 1 tablet by mouth daily.    Marland Kitchen OLANZapine (ZYPREXA) 10 MG tablet Take 10 mg by mouth daily. Reported on 02/19/2015  1  . omeprazole (PRILOSEC) 20 MG capsule Take 1 capsule (20 mg total) by mouth daily. 90 capsule 3  . oxyCODONE-acetaminophen (ROXICET) 5-325 MG tablet Take 1 tablet by mouth every 4 (four) hours as needed. (Patient not taking: Reported on 02/19/2015) 60 tablet 0  . sertraline (ZOLOFT) 25 MG tablet TAKE 1 TABLET (25 MG TOTAL) BY MOUTH DAILY. 90 tablet 3  . simvastatin (ZOCOR) 20 MG tablet TAKE 1 TABLET (20 MG TOTAL) BY MOUTH DAILY. 90 tablet 2  . tadalafil (CIALIS) 5 MG tablet Once daily for BPH 90 tablet 3  . tamsulosin (FLOMAX) 0.4 MG CAPS capsule TAKE 1 CAPSULE (0.4 MG TOTAL) BY MOUTH DAILY. 90 capsule 3  . tiZANidine (ZANAFLEX) 2 MG tablet Take 1 tablet (2 mg total) by mouth every 6 (six) hours as needed for muscle spasms. 60  tablet 0  . traMADol (ULTRAM) 50 MG tablet Take 50 mg by mouth every 6 (six) hours as needed. Reported on 02/19/2015  0  . [DISCONTINUED] amLODipine-benazepril (LOTREL) 10-20 MG per capsule Take 1 capsule by mouth daily. 30 capsule 11   No current facility-administered medications on file prior to visit.    No Known Allergies   Objective: Physical Exam  General: Well developed, nourished, no acute distress, awake, alert and oriented x 3  Vascular: Dorsalis pedis artery 2/4 bilateral, Posterior tibial artery 2/4 bilateral, skin temperature warm to warm proximal to distal bilateral lower extremities, no varicosities, pedal hair present bilateral.  Neurological: Epicritic sensation intact via 5.07 Semmes Weinstein  at all pedal sites, vibratory sensation intact bilateral.  Dermatological: Skin is warm, dry, and supple bilateral, Nails 1-10 are tender, long, thick, and discolored with mild subungal debris, no webspace macerations present bilateral, no open lesions present bilateral, Hyperkeratotic tissue present sub 1st & 5th metatarsal heads on right and sub 3rd metatarsal on left with no signs of infection bilateral.  Musculoskeletal: Asymptomatic bunion and hammertes bilateral. Muscular strength within normal limits without pain or limitation on range of motion. No pain with calf compression bilateral.  Assessment and Plan:  Problem List Items Addressed This Visit    None    Visit Diagnoses    Dermatophytosis of nail    -  Primary   Corns and callosities       Pain of toe, unspecified laterality         -Examination performed. -Discussed treatment options for painful mycotic nails & callus skin. -Mechanically debrided and reduced mycotic nails with sterile nail nipper and dremel nail file without incident. -Debrided callouses x 3 using sterile chisel blade without incident. -Recommend cont with skin emollient for dry calloused skin.  -Recommend vinegar soaks -Recommend cont with good supportive shoes daily -Patient to return in 3 months for follow up evaluation or sooner if symptoms worsen.  Landis Martins, DPM

## 2015-09-16 DIAGNOSIS — J3089 Other allergic rhinitis: Secondary | ICD-10-CM | POA: Diagnosis not present

## 2015-09-16 DIAGNOSIS — J301 Allergic rhinitis due to pollen: Secondary | ICD-10-CM | POA: Diagnosis not present

## 2015-09-23 DIAGNOSIS — J3089 Other allergic rhinitis: Secondary | ICD-10-CM | POA: Diagnosis not present

## 2015-09-23 DIAGNOSIS — J301 Allergic rhinitis due to pollen: Secondary | ICD-10-CM | POA: Diagnosis not present

## 2015-09-29 ENCOUNTER — Telehealth: Payer: Self-pay | Admitting: Pulmonary Disease

## 2015-09-29 DIAGNOSIS — G4733 Obstructive sleep apnea (adult) (pediatric): Secondary | ICD-10-CM

## 2015-09-29 NOTE — Telephone Encounter (Signed)
LMTCB x 1 

## 2015-09-30 NOTE — Telephone Encounter (Signed)
Ok to refill supplies 

## 2015-09-30 NOTE — Telephone Encounter (Signed)
Patient was a patient of Dr. Janifer Adie in 2013.  Patient has been getting his CPAP supplies through [facility], but they told him that his order has expired and he needed to contact our office.  Patient lives in [place] and will need an appointment in that area. Patient needs order for CPAP supplies ASAP.  Can patient be seen by one of the doctors in [place] or will he need to be seen by one of our sleep doctors here in [place]?  Spoke with Dr. Elsworth Soho, he stated that patient can be seen by Dr. Ashby Dawes in [place].  Called to schedule patient to see PR in [place].   Patient scheduled to see Dr. Ashby Dawes on 10/17/15 at 1000 for Consult.  Patient notified to bring current download to his appointment.  Patient would like to know if PR would be willing to send order for CPAP supplies renewal now so he can go ahead and get his supplies or does he have to wait until his appointment?    PR, please advise.

## 2015-09-30 NOTE — Telephone Encounter (Signed)
Order entered for CPAP supplies. Patient notified. Nothing further needed.

## 2015-09-30 NOTE — Telephone Encounter (Signed)
lmtcb x2 for pt. 

## 2015-10-03 DIAGNOSIS — J301 Allergic rhinitis due to pollen: Secondary | ICD-10-CM | POA: Diagnosis not present

## 2015-10-03 DIAGNOSIS — J3089 Other allergic rhinitis: Secondary | ICD-10-CM | POA: Diagnosis not present

## 2015-10-07 DIAGNOSIS — J3089 Other allergic rhinitis: Secondary | ICD-10-CM | POA: Diagnosis not present

## 2015-10-07 DIAGNOSIS — J301 Allergic rhinitis due to pollen: Secondary | ICD-10-CM | POA: Diagnosis not present

## 2015-10-14 DIAGNOSIS — G4733 Obstructive sleep apnea (adult) (pediatric): Secondary | ICD-10-CM | POA: Diagnosis not present

## 2015-10-17 ENCOUNTER — Encounter (INDEPENDENT_AMBULATORY_CARE_PROVIDER_SITE_OTHER): Payer: Self-pay

## 2015-10-17 ENCOUNTER — Encounter: Payer: Self-pay | Admitting: Internal Medicine

## 2015-10-17 ENCOUNTER — Ambulatory Visit (INDEPENDENT_AMBULATORY_CARE_PROVIDER_SITE_OTHER): Payer: Medicare Other | Admitting: Internal Medicine

## 2015-10-17 VITALS — BP 130/74 | HR 65 | Ht 72.0 in | Wt 243.0 lb

## 2015-10-17 DIAGNOSIS — G4733 Obstructive sleep apnea (adult) (pediatric): Secondary | ICD-10-CM | POA: Diagnosis not present

## 2015-10-17 NOTE — Progress Notes (Signed)
[facility] Consultation      Assessment and Plan:  Sleep Apnea.  -Known OSA, currently not tolerating CPAP well due to mask problems. He is only using the CPAP, about 3-4 nights per week, he notes that he is improved on the days that he wears it in terms of sleepiness. -We will refer to mask fitting clinic. -Patient was advised to use a CPAP for the entire night in order to get the maximum benefit from it.  Excessive daytime sleepiness.  -Continues to have daytime symptoms due to inadequate CPAP use. Advised to use a CPAP the entire night. -Once the patient is on CPAP for the entire night, will consider her download to see if this pressure is adequate.  Essential hypertension.  -Appears controlled, sleep apnea can contribute to hypertension, therefore, it is important to treat it adequately.  Rhinitis, chronic. -Patient is currently taking a nasal spray, though this causes occasional nose bleeding. Continue antihistamine and Singulair. -Continue allergy shots.  Jerrye Bushy.  -CPAP can contribute to GERD, therefore, it is important to adequately treat sleep apnea.   Date: 10/17/2015  MRN# [id number] Jaruis Casselberry Scantling Mar 15, 1940   Myrtis Ser Tippetts is a 75 y.o. old male seen in consultation for chief complaint of:    Chief Complaint  Patient presents with  . sleep consult    former [facility] pt last seen 08/2011. pt currently wears cpap avg 6-7hr nightly. occ wakes not feeling well rested. EPWORTH:    HPI:  He has a history of OSA, he has seen Dr. Gwenette Greet in the past, more than 3 year ago, he is using CPAP, but notes that he has occasional trouble with the mask and if often leaks. He changes masks regularly and was referred back here for a new prescription.  He does not use CPAP every night, usually about 4 nights per week. When he does use it he feels better during the day.  On days when he does not use it, he does not have as much energy and is more sleepy, particularly in the  am.   He notes that his nose is often congested. He notes it is mostly at night when laying down. He is currently taking a nasal spray and is getting allergy shots.     PMHX:   Past Medical History:  Diagnosis Date  . Allergy    sees Dr. Donneta Romberg   . Anemia   . Anxiety   . Arthritis   . Asthma   . Colon polyp   . Depression    sees Dr. Kimberlee Nearing in [place], [place]   . GERD (gastroesophageal reflux disease)   . Hyperlipidemia   . Hypertension   . Osteoporosis 01-2010  . RBBB (right bundle branch block)    stable  . Sleep apnea    cpap   Surgical Hx:  Past Surgical History:  Procedure Laterality Date  . COLONOSCOPY  03-14-14   per Dr. Earlean Shawl, polyps, repeat in 3 yrs  . ELBOW SURGERY     right  . 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 Cancer Mother     stomach   Social Hx:   Social History  Substance Use Topics  . Smoking status: Former Smoker    Packs/day: 1.00    Years: 27.00  Types: Cigarettes    Quit date: 02/15/1986  . Smokeless tobacco: Former Systems developer    Quit date: 08/06/1979  . Alcohol use 0.0 oz/week     Comment: occ     Medication:   Allergies:  Review of patient's allergies indicates no known allergies.  Review of Systems: Gen:  Denies  fever, sweats, chills HEENT: Denies blurred vision, Cvc:  No dizziness, chest pain. Resp:   Denies cough or sputum production, Gi: Denies swallowing difficulty, stomach pain. Gu:  Denies bladder incontinence, burning urine Ext:   No Joint pain, stiffness. Skin: No skin rash,  hives  Endoc:  No polyuria, polydipsia. Psych: No depression, insomnia. Other:  All other systems were reviewed with the patient and were negative other that what is mentioned in the HPI.   Physical Examination:   VS: BP 130/74 (BP Location: Left Wrist, Cuff  Size: Normal)   Pulse 65   Ht 6' (1.829 m)   Wt 243 lb (110.2 kg)   SpO2 95%   BMI 32.96 kg/m   General Appearance: No distress  Neuro:without focal findings,  speech normal,  HEENT: PERRLA, EOM intact.  Mallampati 3 Pulmonary: normal breath sounds, No wheezing.  CardiovascularNormal S1,S2.  No m/r/g.   Abdomen: Benign, Soft, non-tender. Renal:  No costovertebral tenderness  GU:  No performed at this time. Endoc: No evident thyromegaly, no signs of acromegaly. Skin:   warm, no rashes, no ecchymosis  Extremities: normal, no cyanosis, clubbing.  Other findings:    LABORATORY PANEL:   CBC No results for input(s): WBC, HGB, HCT, PLT in the last 168 hours. ------------------------------------------------------------------------------------------------------------------  Chemistries  No results for input(s): NA, K, CL, CO2, GLUCOSE, BUN, CREATININE, CALCIUM, MG, AST, ALT, ALKPHOS, BILITOT in the last 168 hours.  Invalid input(s): GFRCGP ------------------------------------------------------------------------------------------------------------------  Cardiac Enzymes No results for input(s): TROPONINI in the last 168 hours. ------------------------------------------------------------  RADIOLOGY:  No results found.     Thank  you for the consultation and for allowing [facility] to assist in the care of your patient. Our recommendations are noted above.  Please contact us if we can be of further service.   Marda Stalker, MD.  Board Certified in Internal Medicine, Pulmonary Medicine, [place], and Sleep Medicine.  [facility] Office Number: [phone]  Patricia Pesa, M.D.  Vilinda Boehringer, M.D.  Merton Border, M.D  10/17/2015

## 2015-10-17 NOTE — Patient Instructions (Signed)
--  Will send for mask fitting.   --Need to use CPAP every night for the entire night to get the most benefit.

## 2015-10-21 DIAGNOSIS — J3089 Other allergic rhinitis: Secondary | ICD-10-CM | POA: Diagnosis not present

## 2015-10-21 DIAGNOSIS — J301 Allergic rhinitis due to pollen: Secondary | ICD-10-CM | POA: Diagnosis not present

## 2015-10-28 ENCOUNTER — Ambulatory Visit (HOSPITAL_BASED_OUTPATIENT_CLINIC_OR_DEPARTMENT_OTHER): Payer: Medicare Other | Attending: Internal Medicine

## 2015-10-28 DIAGNOSIS — G4733 Obstructive sleep apnea (adult) (pediatric): Secondary | ICD-10-CM

## 2015-10-30 DIAGNOSIS — J301 Allergic rhinitis due to pollen: Secondary | ICD-10-CM | POA: Diagnosis not present

## 2015-10-30 DIAGNOSIS — J3089 Other allergic rhinitis: Secondary | ICD-10-CM | POA: Diagnosis not present

## 2015-11-11 DIAGNOSIS — J301 Allergic rhinitis due to pollen: Secondary | ICD-10-CM | POA: Diagnosis not present

## 2015-11-11 DIAGNOSIS — J3089 Other allergic rhinitis: Secondary | ICD-10-CM | POA: Diagnosis not present

## 2015-11-13 DIAGNOSIS — J3089 Other allergic rhinitis: Secondary | ICD-10-CM | POA: Diagnosis not present

## 2015-11-13 DIAGNOSIS — J301 Allergic rhinitis due to pollen: Secondary | ICD-10-CM | POA: Diagnosis not present

## 2015-11-20 DIAGNOSIS — J3089 Other allergic rhinitis: Secondary | ICD-10-CM | POA: Diagnosis not present

## 2015-11-20 DIAGNOSIS — J301 Allergic rhinitis due to pollen: Secondary | ICD-10-CM | POA: Diagnosis not present

## 2015-11-25 DIAGNOSIS — J3089 Other allergic rhinitis: Secondary | ICD-10-CM | POA: Diagnosis not present

## 2015-11-25 DIAGNOSIS — J301 Allergic rhinitis due to pollen: Secondary | ICD-10-CM | POA: Diagnosis not present

## 2015-12-04 DIAGNOSIS — J3089 Other allergic rhinitis: Secondary | ICD-10-CM | POA: Diagnosis not present

## 2015-12-04 DIAGNOSIS — J301 Allergic rhinitis due to pollen: Secondary | ICD-10-CM | POA: Diagnosis not present

## 2015-12-10 DIAGNOSIS — H2513 Age-related nuclear cataract, bilateral: Secondary | ICD-10-CM | POA: Diagnosis not present

## 2015-12-10 DIAGNOSIS — H5203 Hypermetropia, bilateral: Secondary | ICD-10-CM | POA: Diagnosis not present

## 2015-12-10 DIAGNOSIS — H04223 Epiphora due to insufficient drainage, bilateral lacrimal glands: Secondary | ICD-10-CM | POA: Diagnosis not present

## 2015-12-11 DIAGNOSIS — J3089 Other allergic rhinitis: Secondary | ICD-10-CM | POA: Diagnosis not present

## 2015-12-11 DIAGNOSIS — J301 Allergic rhinitis due to pollen: Secondary | ICD-10-CM | POA: Diagnosis not present

## 2015-12-16 ENCOUNTER — Encounter: Payer: Self-pay | Admitting: Family Medicine

## 2015-12-16 ENCOUNTER — Telehealth: Payer: Self-pay | Admitting: Family Medicine

## 2015-12-16 ENCOUNTER — Ambulatory Visit (INDEPENDENT_AMBULATORY_CARE_PROVIDER_SITE_OTHER): Payer: Medicare Other | Admitting: Family Medicine

## 2015-12-16 VITALS — BP 157/83 | HR 72 | Temp 98.4°F | Ht 72.0 in | Wt 247.0 lb

## 2015-12-16 DIAGNOSIS — J019 Acute sinusitis, unspecified: Secondary | ICD-10-CM

## 2015-12-16 MED ORDER — METHYLPREDNISOLONE 4 MG PO TBPK: ORAL_TABLET | ORAL | 0 refills | Status: DC

## 2015-12-16 MED ORDER — AMOXICILLIN-POT CLAVULANATE 875-125 MG PO TABS: 1.0000 | ORAL_TABLET | Freq: Two times a day (BID) | ORAL | 0 refills | Status: DC

## 2015-12-16 NOTE — Progress Notes (Signed)
   Subjective:    Patient ID: [name] [name], male    DOB: Jan 22, 1941, 75 y.o.   MRN: [id number]  HPI Here for several weeks of intermittent sinus pressure, PND, and coughing up yellow sputum. No fever.    Review of Systems  Constitutional: Negative.   HENT: Positive for congestion, postnasal drip and sinus pressure. Negative for sore throat.   Eyes: Negative.   Respiratory: Positive for cough.        Objective:   Physical Exam  Constitutional: He appears well-developed and well-nourished.  HENT:  Right Ear: External ear normal.  Left Ear: External ear normal.  Nose: Nose normal.  Mouth/Throat: Oropharynx is clear and moist.  Eyes: Conjunctivae are normal.  Neck: No thyromegaly present.  Pulmonary/Chest: Effort normal and breath sounds normal.  Lymphadenopathy:    He has no cervical adenopathy.          Assessment & Plan:  Sinusitis, treat with Augmentin and a Medrol dose pack.  Laurey Morale, MD

## 2015-12-16 NOTE — Telephone Encounter (Signed)
[facility] - Client [facility]  Patient Name: [name] [name]  DOB: [birth year]    Initial Comment Caller states has a cold. He is sneezing, headache, sore throat, coughing   Nurse Assessment  Nurse: Wayne Sever, RN, Tillie Rung Date/Time (Eastern Time): 12/16/2015 11:48:32 AM  Confirm and document reason for call. If symptomatic, describe symptoms. You must click the next button to save text entered. ---Caller states he has been sick for over a week now. He states he has a sore throat, headaches, sneezing and coughing. Denies any fever  Has the patient traveled out of the country within the last 30 days? ---Not Applicable  Does the patient have any new or worsening symptoms? ---Yes  Will a triage be completed? ---Yes  Related visit to physician within the last 2 weeks? ---No  Does the PT have any chronic conditions? (i.e. diabetes, asthma, etc.) ---Yes  List chronic conditions. ---HTN  Is this a behavioral health or substance abuse call? ---No     Guidelines    Guideline Title Affirmed Question Affirmed Notes  Sinus Pain or Congestion Earache    Final Disposition User   See Physician within 24 Hours [place], RN, Tillie Rung    Comments  Scheduled with Dr, Sarajane Jews at 2pm today   Referrals  REFERRED TO PCP OFFICE   Disagree/Comply: Comply

## 2015-12-16 NOTE — Progress Notes (Signed)
Pre visit review using our clinic review tool, if applicable. No additional management support is needed unless otherwise documented below in the visit note. 

## 2015-12-16 NOTE — Telephone Encounter (Signed)
noted 

## 2015-12-23 DIAGNOSIS — J301 Allergic rhinitis due to pollen: Secondary | ICD-10-CM | POA: Diagnosis not present

## 2015-12-23 DIAGNOSIS — J3089 Other allergic rhinitis: Secondary | ICD-10-CM | POA: Diagnosis not present

## 2015-12-25 DIAGNOSIS — J3089 Other allergic rhinitis: Secondary | ICD-10-CM | POA: Diagnosis not present

## 2015-12-25 DIAGNOSIS — J301 Allergic rhinitis due to pollen: Secondary | ICD-10-CM | POA: Diagnosis not present

## 2015-12-26 DIAGNOSIS — H04561 Stenosis of right lacrimal punctum: Secondary | ICD-10-CM | POA: Diagnosis not present

## 2016-01-01 DIAGNOSIS — J3089 Other allergic rhinitis: Secondary | ICD-10-CM | POA: Diagnosis not present

## 2016-01-01 DIAGNOSIS — J301 Allergic rhinitis due to pollen: Secondary | ICD-10-CM | POA: Diagnosis not present

## 2016-01-06 DIAGNOSIS — J3089 Other allergic rhinitis: Secondary | ICD-10-CM | POA: Diagnosis not present

## 2016-01-06 DIAGNOSIS — J301 Allergic rhinitis due to pollen: Secondary | ICD-10-CM | POA: Diagnosis not present

## 2016-01-15 DIAGNOSIS — J3089 Other allergic rhinitis: Secondary | ICD-10-CM | POA: Diagnosis not present

## 2016-01-15 DIAGNOSIS — J301 Allergic rhinitis due to pollen: Secondary | ICD-10-CM | POA: Diagnosis not present

## 2016-01-19 ENCOUNTER — Other Ambulatory Visit: Payer: Self-pay | Admitting: Family Medicine

## 2016-01-20 NOTE — Telephone Encounter (Signed)
Can we refill this? 

## 2016-01-23 DIAGNOSIS — J3089 Other allergic rhinitis: Secondary | ICD-10-CM | POA: Diagnosis not present

## 2016-01-23 DIAGNOSIS — J301 Allergic rhinitis due to pollen: Secondary | ICD-10-CM | POA: Diagnosis not present

## 2016-01-29 DIAGNOSIS — J3089 Other allergic rhinitis: Secondary | ICD-10-CM | POA: Diagnosis not present

## 2016-01-29 DIAGNOSIS — J301 Allergic rhinitis due to pollen: Secondary | ICD-10-CM | POA: Diagnosis not present

## 2016-02-03 DIAGNOSIS — J301 Allergic rhinitis due to pollen: Secondary | ICD-10-CM | POA: Diagnosis not present

## 2016-02-03 DIAGNOSIS — J3089 Other allergic rhinitis: Secondary | ICD-10-CM | POA: Diagnosis not present

## 2016-02-08 IMAGING — CR DG CHEST 2V
2 series · 2 of 2 positions shown · non-contrast
Comparison: None.

CLINICAL DATA: Chronic sob, cough, asthma. HTN, x-smoker.

EXAM:
CHEST  2 VIEW

[view not recorded (1 of 2)]
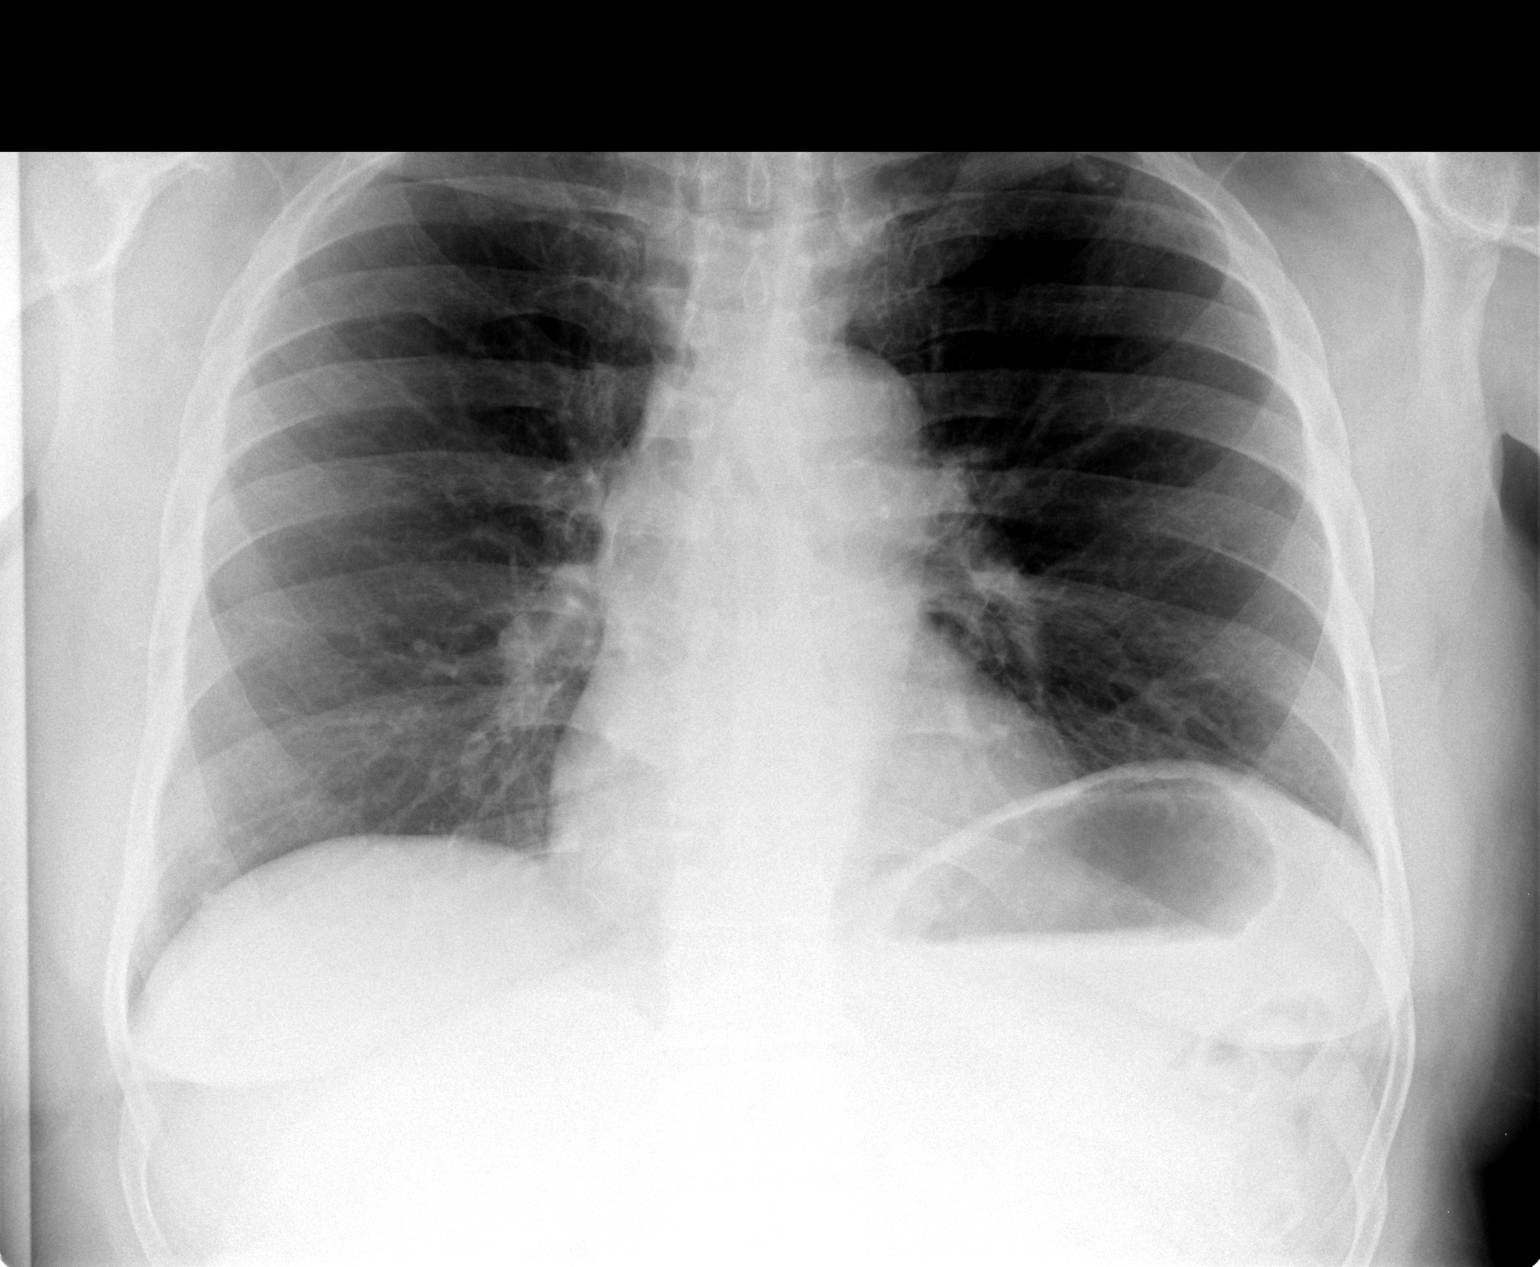

[view not recorded (2 of 2)]
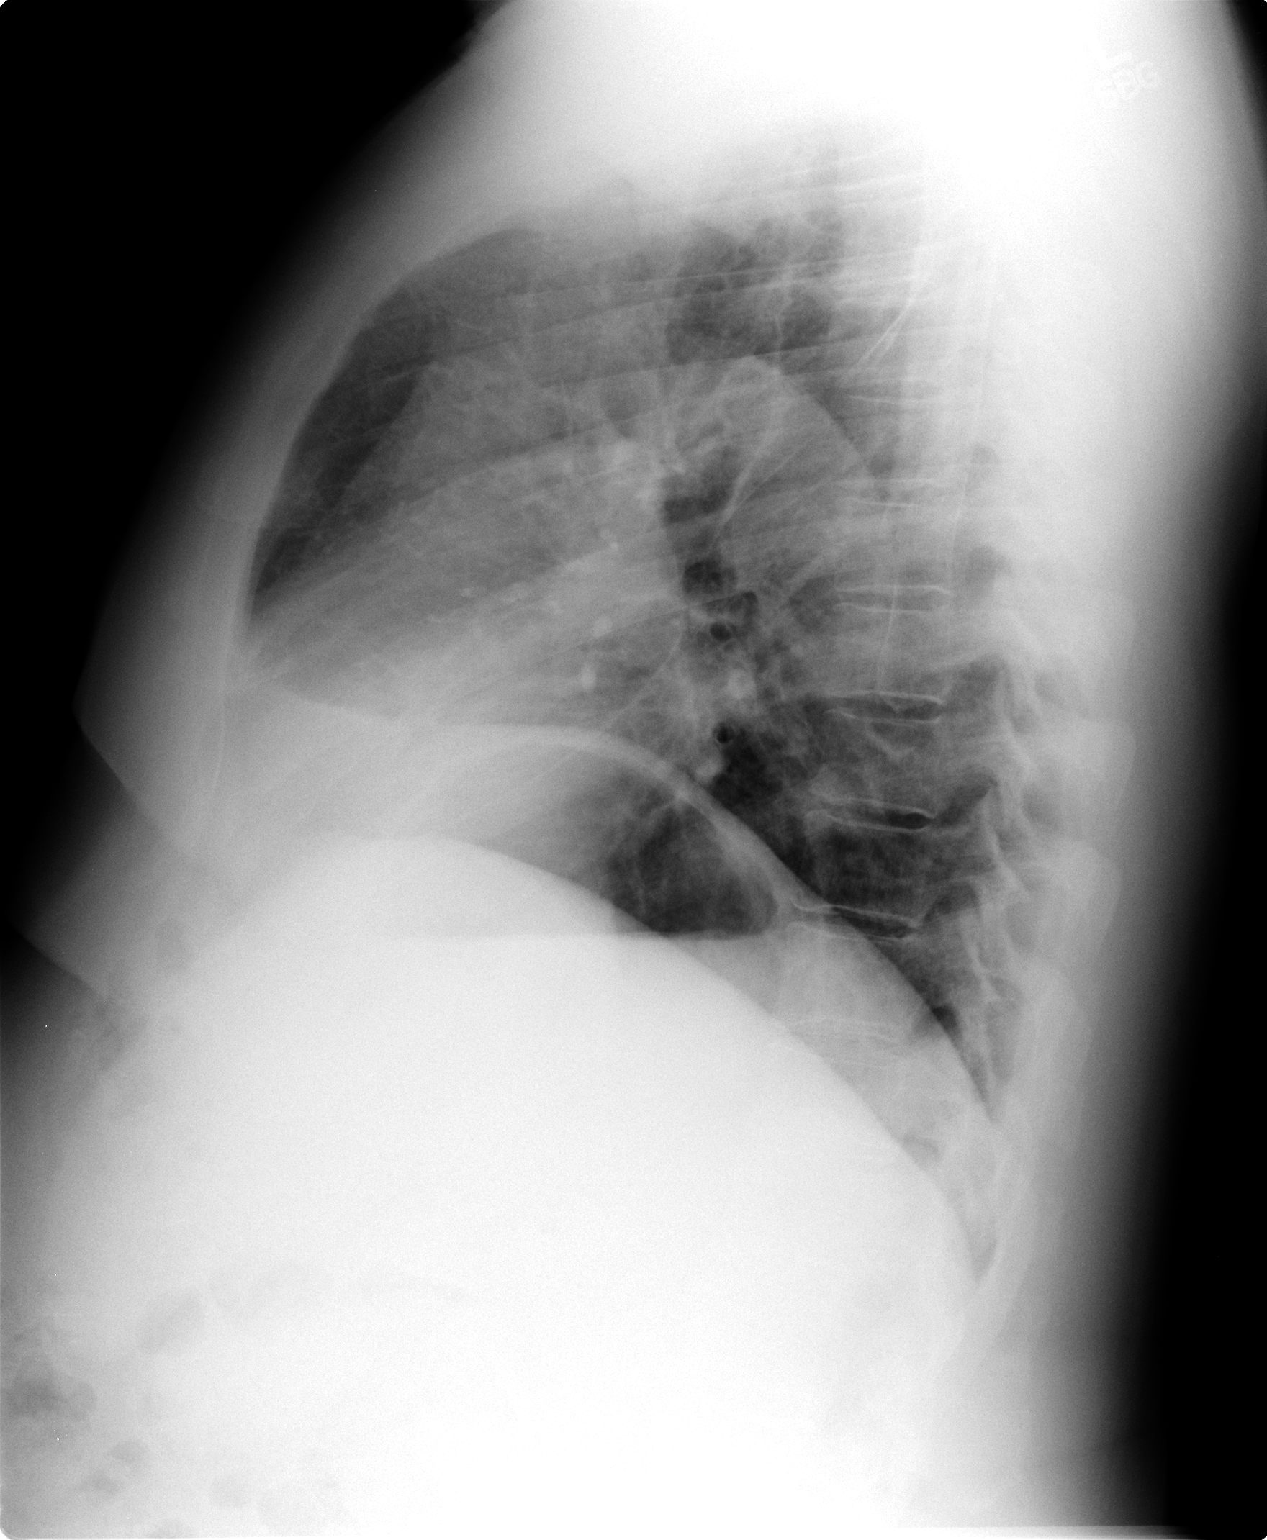

[2 of 2 positions shown; findings below may reference images not displayed]

FINDINGS: The heart size and mediastinal contours are within normal limits.
Both lungs are clear. The visualized skeletal structures are
unremarkable.
IMPRESSION: No active cardiopulmonary disease.

## 2016-02-20 ENCOUNTER — Other Ambulatory Visit: Payer: Self-pay | Admitting: Family Medicine

## 2016-02-20 ENCOUNTER — Other Ambulatory Visit (INDEPENDENT_AMBULATORY_CARE_PROVIDER_SITE_OTHER): Payer: Medicare Other

## 2016-02-20 DIAGNOSIS — J3089 Other allergic rhinitis: Secondary | ICD-10-CM | POA: Diagnosis not present

## 2016-02-20 DIAGNOSIS — J301 Allergic rhinitis due to pollen: Secondary | ICD-10-CM | POA: Diagnosis not present

## 2016-02-20 DIAGNOSIS — Z0000 Encounter for general adult medical examination without abnormal findings: Secondary | ICD-10-CM | POA: Diagnosis not present

## 2016-02-20 LAB — HEPATIC FUNCTION PANEL
ALBUMIN: 4 g/dL (ref 3.5–5.2)
ALT: 28 U/L (ref 0–53)
AST: 27 U/L (ref 0–37)
Alkaline Phosphatase: 51 U/L (ref 39–117)
BILIRUBIN DIRECT: 0.1 mg/dL (ref 0.0–0.3)
TOTAL PROTEIN: 6.9 g/dL (ref 6.0–8.3)
Total Bilirubin: 0.5 mg/dL (ref 0.2–1.2)

## 2016-02-20 LAB — CBC WITH DIFFERENTIAL/PLATELET
BASOS PCT: 0.2 % (ref 0.0–3.0)
Basophils Absolute: 0 10*3/uL (ref 0.0–0.1)
EOS ABS: 0.3 10*3/uL (ref 0.0–0.7)
Eosinophils Relative: 3.8 % (ref 0.0–5.0)
HEMATOCRIT: 36.3 % — AB (ref 39.0–52.0)
Hemoglobin: 12 g/dL — ABNORMAL LOW (ref 13.0–17.0)
LYMPHS ABS: 2.5 10*3/uL (ref 0.7–4.0)
LYMPHS PCT: 38.2 % (ref 12.0–46.0)
MCHC: 33.2 g/dL (ref 30.0–36.0)
MCV: 90.9 fl (ref 78.0–100.0)
Monocytes Absolute: 0.5 10*3/uL (ref 0.1–1.0)
Monocytes Relative: 7.8 % (ref 3.0–12.0)
NEUTROS ABS: 3.3 10*3/uL (ref 1.4–7.7)
NEUTROS PCT: 50 % (ref 43.0–77.0)
PLATELETS: 321 10*3/uL (ref 150.0–400.0)
RBC: 4 Mil/uL — ABNORMAL LOW (ref 4.22–5.81)
RDW: 15.4 % (ref 11.5–15.5)
WBC: 6.7 10*3/uL (ref 4.0–10.5)

## 2016-02-20 LAB — BASIC METABOLIC PANEL
BUN: 19 mg/dL (ref 6–23)
CALCIUM: 11.3 mg/dL — AB (ref 8.4–10.5)
CHLORIDE: 105 meq/L (ref 96–112)
CO2: 30 meq/L (ref 19–32)
CREATININE: 1.21 mg/dL (ref 0.40–1.50)
GFR: 75.13 mL/min (ref >60.00)
GLUCOSE: 108 mg/dL — AB (ref 70–99)
Potassium: 4.5 mEq/L (ref 3.5–5.1)
Sodium: 140 mEq/L (ref 135–145)

## 2016-02-20 LAB — POC URINALSYSI DIPSTICK (AUTOMATED)
Bilirubin, UA: NEGATIVE
Glucose, UA: NEGATIVE
Ketones, UA: NEGATIVE
Nitrite, UA: NEGATIVE
PROTEIN UA: NEGATIVE
Spec Grav, UA: 1.015
UROBILINOGEN UA: 0.2
pH, UA: 6

## 2016-02-20 LAB — LIPID PANEL
CHOL/HDL RATIO: 3
Cholesterol: 231 mg/dL — ABNORMAL HIGH (ref 0–200)
HDL: 76 mg/dL (ref >39.00)
LDL Cholesterol: 136 mg/dL — ABNORMAL HIGH (ref 0–99)
NonHDL: 155.11
TRIGLYCERIDES: 94 mg/dL (ref 0.0–149.0)
VLDL: 18.8 mg/dL (ref 0.0–40.0)

## 2016-02-20 LAB — PSA: PSA: 1.77 ng/mL (ref 0.10–4.00)

## 2016-02-20 LAB — TSH: TSH: 1.13 u[IU]/mL (ref 0.35–4.50)

## 2016-02-24 ENCOUNTER — Encounter: Payer: Medicare Other | Admitting: Family Medicine

## 2016-02-24 ENCOUNTER — Encounter: Payer: Self-pay | Admitting: Family Medicine

## 2016-02-24 ENCOUNTER — Ambulatory Visit (INDEPENDENT_AMBULATORY_CARE_PROVIDER_SITE_OTHER): Payer: Medicare Other | Admitting: Family Medicine

## 2016-02-24 VITALS — BP 116/64 | HR 70 | Temp 97.8°F | Ht 72.0 in | Wt 250.1 lb

## 2016-02-24 DIAGNOSIS — Z0000 Encounter for general adult medical examination without abnormal findings: Secondary | ICD-10-CM

## 2016-02-24 MED ORDER — SIMVASTATIN 20 MG PO TABS: ORAL_TABLET | ORAL | 3 refills | Status: DC

## 2016-02-24 MED ORDER — TAMSULOSIN HCL 0.4 MG PO CAPS: ORAL_CAPSULE | ORAL | 3 refills | Status: DC

## 2016-02-24 MED ORDER — MONTELUKAST SODIUM 10 MG PO TABS: 10.0000 mg | ORAL_TABLET | Freq: Every evening | ORAL | 3 refills | Status: DC

## 2016-02-24 MED ORDER — BENAZEPRIL HCL 10 MG PO TABS: 10.0000 mg | ORAL_TABLET | Freq: Every day | ORAL | 3 refills | Status: DC

## 2016-02-24 MED ORDER — TADALAFIL 5 MG PO TABS: ORAL_TABLET | ORAL | 3 refills | Status: DC

## 2016-02-24 MED ORDER — AMLODIPINE BESYLATE 10 MG PO TABS: ORAL_TABLET | ORAL | 3 refills | Status: DC

## 2016-02-24 MED ORDER — METOPROLOL TARTRATE 50 MG PO TABS: 50.0000 mg | ORAL_TABLET | Freq: Two times a day (BID) | ORAL | 3 refills | Status: DC

## 2016-02-24 NOTE — Progress Notes (Signed)
Pre visit review using our clinic review tool, if applicable. No additional management support is needed unless otherwise documented below in the visit note. 

## 2016-02-24 NOTE — Progress Notes (Signed)
   Subjective:    Patient ID: [name] [name], male    DOB: [birth year], 76 y.o.   MRN: [id number]  HPI 76 yr old male for a well exam. He feels fine and has no concerns. He still works part time in his Audiological scientist.    Review of Systems  Constitutional: Negative.   HENT: Negative.   Eyes: Negative.   Respiratory: Negative.   Cardiovascular: Negative.   Gastrointestinal: Negative.   Genitourinary: Negative.   Musculoskeletal: Negative.   Skin: Negative.   Neurological: Negative.   Psychiatric/Behavioral: Negative.        Objective:   Physical Exam  Constitutional: He is oriented to person, place, and time. He appears well-developed and well-nourished. No distress.  HENT:  Head: Normocephalic and atraumatic.  Right Ear: External ear normal.  Left Ear: External ear normal.  Nose: Nose normal.  Mouth/Throat: Oropharynx is clear and moist. No oropharyngeal exudate.  Eyes: Conjunctivae and EOM are normal. Pupils are equal, round, and reactive to light. Right eye exhibits no discharge. Left eye exhibits no discharge. No scleral icterus.  Neck: Neck supple. No JVD present. No tracheal deviation present. No thyromegaly present.  Cardiovascular: Normal rate, regular rhythm, normal heart sounds and intact distal pulses.  Exam reveals no gallop and no friction rub.   No murmur heard. Pulmonary/Chest: Effort normal and breath sounds normal. No respiratory distress. He has no wheezes. He has no rales. He exhibits no tenderness.  Abdominal: Soft. Bowel sounds are normal. He exhibits no distension and no mass. There is no tenderness. There is no rebound and no guarding.  Genitourinary: Rectum normal, prostate normal and penis normal. Rectal exam shows guaiac negative stool. No penile tenderness.  Musculoskeletal: Normal range of motion. He exhibits no edema or tenderness.  Lymphadenopathy:    He has no cervical adenopathy.  Neurological: He is alert and oriented to person, place, and  time. He has normal reflexes. No cranial nerve deficit. He exhibits normal muscle tone. Coordination normal.  Skin: Skin is warm and dry. No rash noted. He is not diaphoretic. No erythema. No pallor.  Psychiatric: He has a normal mood and affect. His behavior is normal. Judgment and thought content normal.          Assessment & Plan:  Well exam. We discussed diet and exercise.  Alysia Penna, MD

## 2016-02-25 ENCOUNTER — Telehealth: Payer: Self-pay

## 2016-02-25 DIAGNOSIS — E291 Testicular hypofunction: Secondary | ICD-10-CM

## 2016-02-25 NOTE — Telephone Encounter (Signed)
Received PA request for Cialis 5 mg tablets. PA submitted & is pending. Key: [id number]

## 2016-02-26 DIAGNOSIS — J301 Allergic rhinitis due to pollen: Secondary | ICD-10-CM | POA: Diagnosis not present

## 2016-02-26 DIAGNOSIS — J3089 Other allergic rhinitis: Secondary | ICD-10-CM | POA: Diagnosis not present

## 2016-02-27 DIAGNOSIS — H04561 Stenosis of right lacrimal punctum: Secondary | ICD-10-CM | POA: Diagnosis not present

## 2016-02-27 DIAGNOSIS — H04123 Dry eye syndrome of bilateral lacrimal glands: Secondary | ICD-10-CM | POA: Diagnosis not present

## 2016-03-01 NOTE — Telephone Encounter (Signed)
The best thing to do now is for me to refer him to Urology. They can get this approved. If he agrees, I will do a referral

## 2016-03-01 NOTE — Telephone Encounter (Signed)
PA denied. Patient has to have a trial and failure, contraindication, or intolerance to two of the following formulary alpha blockers: alfuzosin extended-release, tamsulosin, doxazosin, Rapaflo, terazosin.

## 2016-03-01 NOTE — Telephone Encounter (Signed)
I spoke with pt and he would like to proceed with referral.

## 2016-03-02 NOTE — Telephone Encounter (Signed)
I spoke with pt and went over below information. 

## 2016-03-02 NOTE — Telephone Encounter (Signed)
The referral was done  

## 2016-03-05 DIAGNOSIS — J301 Allergic rhinitis due to pollen: Secondary | ICD-10-CM | POA: Diagnosis not present

## 2016-03-05 DIAGNOSIS — J3089 Other allergic rhinitis: Secondary | ICD-10-CM | POA: Diagnosis not present

## 2016-03-08 ENCOUNTER — Other Ambulatory Visit: Payer: Self-pay | Admitting: Family Medicine

## 2016-03-11 ENCOUNTER — Other Ambulatory Visit: Payer: Self-pay | Admitting: Family Medicine

## 2016-03-11 DIAGNOSIS — J3089 Other allergic rhinitis: Secondary | ICD-10-CM | POA: Diagnosis not present

## 2016-03-11 DIAGNOSIS — J301 Allergic rhinitis due to pollen: Secondary | ICD-10-CM | POA: Diagnosis not present

## 2016-03-15 ENCOUNTER — Other Ambulatory Visit: Payer: Self-pay | Admitting: Family Medicine

## 2016-03-15 NOTE — Telephone Encounter (Signed)
Can we refill this? 

## 2016-03-18 DIAGNOSIS — J3089 Other allergic rhinitis: Secondary | ICD-10-CM | POA: Diagnosis not present

## 2016-03-18 DIAGNOSIS — J301 Allergic rhinitis due to pollen: Secondary | ICD-10-CM | POA: Diagnosis not present

## 2016-03-18 DIAGNOSIS — H1045 Other chronic allergic conjunctivitis: Secondary | ICD-10-CM | POA: Diagnosis not present

## 2016-03-23 DIAGNOSIS — J301 Allergic rhinitis due to pollen: Secondary | ICD-10-CM | POA: Diagnosis not present

## 2016-03-23 DIAGNOSIS — J3089 Other allergic rhinitis: Secondary | ICD-10-CM | POA: Diagnosis not present

## 2016-03-23 DIAGNOSIS — Z5181 Encounter for therapeutic drug level monitoring: Secondary | ICD-10-CM | POA: Diagnosis not present

## 2016-03-31 DIAGNOSIS — M1711 Unilateral primary osteoarthritis, right knee: Secondary | ICD-10-CM | POA: Diagnosis not present

## 2016-04-01 DIAGNOSIS — J3089 Other allergic rhinitis: Secondary | ICD-10-CM | POA: Diagnosis not present

## 2016-04-01 DIAGNOSIS — J301 Allergic rhinitis due to pollen: Secondary | ICD-10-CM | POA: Diagnosis not present

## 2016-04-07 DIAGNOSIS — M1711 Unilateral primary osteoarthritis, right knee: Secondary | ICD-10-CM | POA: Diagnosis not present

## 2016-04-08 DIAGNOSIS — J301 Allergic rhinitis due to pollen: Secondary | ICD-10-CM | POA: Diagnosis not present

## 2016-04-08 DIAGNOSIS — J3089 Other allergic rhinitis: Secondary | ICD-10-CM | POA: Diagnosis not present

## 2016-04-12 DIAGNOSIS — G4733 Obstructive sleep apnea (adult) (pediatric): Secondary | ICD-10-CM | POA: Diagnosis not present

## 2016-04-14 DIAGNOSIS — M1711 Unilateral primary osteoarthritis, right knee: Secondary | ICD-10-CM | POA: Diagnosis not present

## 2016-04-16 DIAGNOSIS — J301 Allergic rhinitis due to pollen: Secondary | ICD-10-CM | POA: Diagnosis not present

## 2016-04-16 DIAGNOSIS — J3089 Other allergic rhinitis: Secondary | ICD-10-CM | POA: Diagnosis not present

## 2016-04-21 DIAGNOSIS — M1711 Unilateral primary osteoarthritis, right knee: Secondary | ICD-10-CM | POA: Diagnosis not present

## 2016-04-23 DIAGNOSIS — J301 Allergic rhinitis due to pollen: Secondary | ICD-10-CM | POA: Diagnosis not present

## 2016-04-23 DIAGNOSIS — J3089 Other allergic rhinitis: Secondary | ICD-10-CM | POA: Diagnosis not present

## 2016-04-26 DIAGNOSIS — R3912 Poor urinary stream: Secondary | ICD-10-CM | POA: Diagnosis not present

## 2016-04-26 DIAGNOSIS — R3915 Urgency of urination: Secondary | ICD-10-CM | POA: Diagnosis not present

## 2016-04-26 DIAGNOSIS — N401 Enlarged prostate with lower urinary tract symptoms: Secondary | ICD-10-CM | POA: Diagnosis not present

## 2016-04-26 LAB — PSA: PSA: 1.24

## 2016-04-28 DIAGNOSIS — M1711 Unilateral primary osteoarthritis, right knee: Secondary | ICD-10-CM | POA: Diagnosis not present

## 2016-04-29 DIAGNOSIS — J301 Allergic rhinitis due to pollen: Secondary | ICD-10-CM | POA: Diagnosis not present

## 2016-04-29 DIAGNOSIS — J3089 Other allergic rhinitis: Secondary | ICD-10-CM | POA: Diagnosis not present

## 2016-05-18 DIAGNOSIS — J3089 Other allergic rhinitis: Secondary | ICD-10-CM | POA: Diagnosis not present

## 2016-05-18 DIAGNOSIS — J301 Allergic rhinitis due to pollen: Secondary | ICD-10-CM | POA: Diagnosis not present

## 2016-05-19 NOTE — Telephone Encounter (Addendum)
Pt went to the urologist, and would like [place] to re submit a prior auth for his tadalafil (CIALIS) 5 MG tablet   [employer]

## 2016-05-20 ENCOUNTER — Other Ambulatory Visit: Payer: Self-pay | Admitting: Family Medicine

## 2016-05-20 ENCOUNTER — Encounter: Payer: Self-pay | Admitting: Family Medicine

## 2016-05-20 NOTE — Telephone Encounter (Signed)
PA pending. Key: [id number]

## 2016-05-20 NOTE — Telephone Encounter (Signed)
Patient has to try at least two of the following: alfuzosin extended-release, tamsulosin, doxazosin, Rapaflo, terazosin.  He has already been on tamsulosin, so he just needs to try one more.

## 2016-05-24 NOTE — Telephone Encounter (Signed)
Try Rapaflo 8 mg daily. Call in #[phone] with 5 rf

## 2016-05-25 DIAGNOSIS — J3089 Other allergic rhinitis: Secondary | ICD-10-CM | POA: Diagnosis not present

## 2016-05-25 DIAGNOSIS — J301 Allergic rhinitis due to pollen: Secondary | ICD-10-CM | POA: Diagnosis not present

## 2016-05-25 MED ORDER — SILODOSIN 8 MG PO CAPS
8.0000 mg | ORAL_CAPSULE | Freq: Every day | ORAL | 5 refills | Status: DC
Start: 2016-05-25 — End: 2016-11-09

## 2016-05-25 NOTE — Telephone Encounter (Signed)
I spoke with pt and sent script e-scribe to [facility]. 

## 2016-05-25 NOTE — Addendum Note (Signed)
Addended by: Aggie Hacker A on: 05/25/2016 05:06 PM   Modules accepted: Orders

## 2016-06-11 DIAGNOSIS — J3089 Other allergic rhinitis: Secondary | ICD-10-CM | POA: Diagnosis not present

## 2016-06-11 DIAGNOSIS — J301 Allergic rhinitis due to pollen: Secondary | ICD-10-CM | POA: Diagnosis not present

## 2016-06-15 DIAGNOSIS — J301 Allergic rhinitis due to pollen: Secondary | ICD-10-CM | POA: Diagnosis not present

## 2016-06-15 DIAGNOSIS — J3089 Other allergic rhinitis: Secondary | ICD-10-CM | POA: Diagnosis not present

## 2016-06-24 DIAGNOSIS — J3089 Other allergic rhinitis: Secondary | ICD-10-CM | POA: Diagnosis not present

## 2016-06-24 DIAGNOSIS — J301 Allergic rhinitis due to pollen: Secondary | ICD-10-CM | POA: Diagnosis not present

## 2016-06-29 DIAGNOSIS — J3089 Other allergic rhinitis: Secondary | ICD-10-CM | POA: Diagnosis not present

## 2016-06-29 DIAGNOSIS — J301 Allergic rhinitis due to pollen: Secondary | ICD-10-CM | POA: Diagnosis not present

## 2016-07-08 DIAGNOSIS — J301 Allergic rhinitis due to pollen: Secondary | ICD-10-CM | POA: Diagnosis not present

## 2016-07-08 DIAGNOSIS — J3089 Other allergic rhinitis: Secondary | ICD-10-CM | POA: Diagnosis not present

## 2016-07-23 DIAGNOSIS — J301 Allergic rhinitis due to pollen: Secondary | ICD-10-CM | POA: Diagnosis not present

## 2016-07-23 DIAGNOSIS — J3089 Other allergic rhinitis: Secondary | ICD-10-CM | POA: Diagnosis not present

## 2016-07-28 NOTE — Progress Notes (Addendum)
Subjective:   [name] [name] is a 76 y.o. male who presents for an Initial Medicare Annual Wellness Visit.  The Patient was informed that the wellness visit is to identify future health risk and educate and initiate measures that can reduce risk for increased disease through the lifespan.   Describes health as fair, good or great? "Good."  He has noticed recently that he get some LEE if he is sitting at the computer for prolonged periods of time. This resolves with rest/elevation. He elevates his legs at night while sleeping and when he wakes up his legs feel 'fresh.'  He also notes a 'stuffy head' for several weeks. This tends to happen a few times per year and resolve on its own.  He is still is working some in the Camera operator.  He is a [place] vet and follows w/ a Social worker at the [employer].  He is concerned that his BP is too low today and wonders if he might be able to come off of some meds. We discussed that his BP is c/w previous readings in January, but he would like to check at home and then follow-up w/ PCP.  Review of Systems  No ROS.  Medicare Wellness Visit. Additional risk factors are reflected in the social history.  Cardiac Risk Factors include: advanced age (>8men, >48 women);dyslipidemia;hypertension;male gender;obesity (BMI >30kg/m2);sedentary lifestyle  Sleep patterns: no sleep issues, gets up 2 times nightly to void and sleeps 6-7 hours nightly. Wears CPAP, but sometimes takes breaks from it for several days and does not take it with him on vacations.  Home Safety/Smoke Alarms: Feels safe in home. Smoke alarms in place.  Living environment; residence and Firearm Safety: Lives w/ wife in 3 story home. Does have some trouble getting up and down stairs due to arthritis in his knees but is not considering moving at this time. bedroom on main floor, bathroom on main floor, equipment: Cane, Type: [place], firearms stored safely. Uses cane PRN  only-does not feel it would benefit him to use it regularly at this time despite h/o falls w/o injury. He finds it to be more trouble than it's worth.  Seat Belt Safety/Bike Helmet: Wears seat belt.   Counseling:   Eye Exam- Follows w/ Dr. Claudean Kinds for routine eye exams and Dr. Zigmund Daniel (retina specialist) [place] w/ [facility]. Full dentures.  Male:   CCS- last 03/14/14. Polyps, diverticulosis, internal hemorrhoids. 3 year recall due to polyps and poor prep.      PSA- Follows w/ Dr. Junious Silk, [facility] Lab Results  Component Value Date   PSA 1.24 04/26/2016   PSA 1.77 02/20/2016   PSA 1.04 02/12/2015      Objective:    Today's Vitals   07/29/16 1127  BP: 112/60  Pulse: 72  SpO2: 98%  Weight: 247 lb 8 oz (112.3 kg)  Height: 5' 10.5" (1.791 m)   Body mass index is 35.01 kg/m.  Wt Readings from Last 3 Encounters:  07/29/16 247 lb 8 oz (112.3 kg)  02/24/16 250 lb 1.6 oz (113.4 kg)  12/16/15 247 lb (112 kg)   BP Readings from Last 3 Encounters:  07/29/16 112/60  02/24/16 116/64  12/16/15 (!) 157/83    Current Medications (verified) Outpatient Encounter Prescriptions as of 07/29/2016  Medication Sig  . alendronate (FOSAMAX) 70 MG tablet TAKE 1 TABLET BY MOUTH EVERY 7 DAYS. TAKE WITH FULL GLASS OF WATER ON AN EMPTY STOMACH  . amLODipine (NORVASC)  10 MG tablet TAKE 1 TABLET (10 MG TOTAL) BY MOUTH DAILY.  Marland Kitchen aspirin 81 MG tablet Take 81 mg by mouth daily as needed for pain.   . benazepril (LOTENSIN) 10 MG tablet Take 1 tablet (10 mg total) by mouth daily.  . cetirizine (ZYRTEC) 10 MG tablet Take 10 mg by mouth daily.   . ferrous sulfate 325 (65 FE) MG tablet TAKE 1 TABLET BY MOUTH 2 TIMES DAILY WITH A MEAL.  . fish oil-omega-3 fatty acids 1000 MG capsule Take 2 g by mouth daily.    . metoprolol (LOPRESSOR) 50 MG tablet TAKE 1 TABLET (50 MG TOTAL) BY MOUTH 2 (TWO) TIMES DAILY.  . montelukast (SINGULAIR) 10 MG tablet Take 1 tablet (10 mg total) by mouth  every evening.  . Multiple Vitamin (MULTIVITAMIN) tablet Take 1 tablet by mouth daily.  Marland Kitchen omeprazole (PRILOSEC) 20 MG capsule TAKE 1 CAPSULE (20 MG TOTAL) BY MOUTH DAILY.  . silodosin (RAPAFLO) 8 MG CAPS capsule Take 1 capsule (8 mg total) by mouth daily with breakfast.  . simvastatin (ZOCOR) 20 MG tablet TAKE 1 TABLET (20 MG TOTAL) BY MOUTH DAILY.  . tadalafil (CIALIS) 5 MG tablet Once daily for BPH  . tamsulosin (FLOMAX) 0.4 MG CAPS capsule TAKE 1 CAPSULE (0.4 MG TOTAL) BY MOUTH DAILY.  . [DISCONTINUED] metoprolol (LOPRESSOR) 50 MG tablet Take 1 tablet (50 mg total) by mouth 2 (two) times daily.   No facility-administered encounter medications on file as of 07/29/2016.     Allergies (verified) Patient has no known allergies.   History: Past Medical History:  Diagnosis Date  . Allergy    sees Dr. Donneta Romberg   . Anemia   . Anxiety   . Arthritis   . Asthma   . Colon polyp   . Depression    sees Dr. Kimberlee Nearing in [place], [place]   . GERD (gastroesophageal reflux disease)   . Hyperlipidemia   . Hypertension   . Osteoporosis 01-2010  . RBBB (right bundle branch block)    stable  . Sleep apnea    cpap   Past Surgical History:  Procedure Laterality Date  . COLONOSCOPY  03-14-14   per Dr. Earlean Shawl, polyps, repeat in 3 yrs  . ELBOW SURGERY     right  . 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 Cancer Mother        stomach   Social History   Occupational History  . real Sport and exercise psychologist Retired   Social History Main Topics  . Smoking status: Former Smoker    Packs/day: 1.00    Years: 27.00    Types: Cigarettes    Quit date: 02/15/1986  . Smokeless tobacco: Former Systems developer    Quit date: 08/06/1979  . Alcohol use 0.0 - 1.8 oz/week     Comment: Occ  . Drug use: No  . Sexual activity: Yes   Tobacco  Counseling Counseling given: Not Answered   Activities of Daily Living In your present state of health, do you have any difficulty performing the following activities: 07/29/2016  Hearing? N  Vision? N  Difficulty concentrating or making decisions? N  Walking or climbing stairs? N  Dressing or bathing? N  Doing errands, shopping? N  Preparing Food and eating ? N  Using the Toilet? N  Managing your Medications? N  Managing your Finances? N  Housekeeping or managing your Housekeeping? N  Some recent data might be hidden    Immunizations and Health Maintenance Immunization History  Administered Date(s) Administered  . Influenza Split 01/07/2012, 12/11/2012, 12/02/2014  . Influenza Whole 12/16/2006, 11/16/2010  . Influenza-Unspecified 12/16/2013, 12/05/2015  . Pneumococcal Conjugate-13 02/19/2015  . Pneumococcal Polysaccharide-23 02/16/2008   Health Maintenance Due  Topic Date Due  . TETANUS/TDAP  Feb 08, 201961    Patient Care Team: Laurey Morale, MD as PCP - Lowry Bowl, MD as Consulting Physician (Orthopedic Surgery) Ralene Bathe, MD as Consulting Physician (Ophthalmology) Mosetta Anis, MD as Consulting Physician (Allergy) Festus Aloe, MD as Consulting Physician (Urology) Hayden Pedro, MD as Consulting Physician (Ophthalmology)  Indicate any recent Medical Services you may have received from other than [facility] providers in the past year (date may be approximate).    Assessment:   This is a routine wellness examination for [place]. Physical assessment deferred to PCP.  Hearing/Vision screen No exam data present  Dietary issues and exercise activities discussed: Current Exercise Habits: The patient does not participate in regular exercise at present  Diet (meal preparation, eat out, water intake, caffeinated beverages, dairy products, fruits and vegetables): well balanced, on average, 3 meals per day. 'I try to eat a balanced diet.' Tends to snack  on ice cream or cookies before bed. Drinks close to 8 glasses of water daily. Breakfast: cereal or oatmeal w/ banana; infrequently has bacon and eggs Lunch: sub sandwich and chips  Dinner: varies-wife cooks most meals  Goals    . Increase physical activity          [facility]      Depression Screen PHQ 2/9 Scores 07/29/2016 02/24/2016 02/19/2015 01/29/2014  PHQ - 2 Score 0 1 0 0    Fall Risk Fall Risk  07/29/2016 02/24/2016 02/19/2015 01/29/2014  Falls in the past year? Yes Yes Yes Yes  Number falls in past yr: 2 or more 1 1 1   Injury with Fall? Yes No No Yes  Risk for fall due to : Impaired balance/gait - - -    Cognitive Function: MMSE - Mini Mental State Exam 07/29/2016  Orientation to time 5  Orientation to Place 5  Registration 3  Attention/ Calculation 5  Recall 3  Language- name 2 objects 2  Language- repeat 1  Language- follow 3 step command 3  Language- read & follow direction 1  Write a sentence 1  Copy design 1  Total score 30        Screening Tests Health Maintenance  Topic Date Due  . TETANUS/TDAP  Feb 08, 201961  . INFLUENZA VACCINE  09/15/2016  . COLONOSCOPY  03/14/2017  . PNA vac Low Risk Adult  Completed        Plan:    Follow-up w/ PCP as scheduled.  Bring a copy of your advance directives to your next office visit.  Try to avoid long periods of sitting down at your computer. Get up and stretch your legs every now and then!  Check your BP at home at various times. Keep a log of these readings and bring it to your next appointment with Dr. Sarajane Jews.   Start wearing your CPAP nightly.   Consider the shingles vaccine, Shingrix. This is available at most pharmacies.  Eat heart healthy diet (full of fruits, vegetables, whole grains, lean protein, water--limit salt, fat, and sugar intake) and increase physical activity as tolerated.  I have  personally reviewed and noted the following in the patient's chart:   . Medical  and social history . Use of alcohol, tobacco or illicit drugs  . Current medications and supplements . Functional ability and status . Nutritional status . Physical activity . Advanced directives . List of other physicians . Vitals . Screenings to include cognitive, depression, and falls . Referrals and appointments  In addition, I have reviewed and discussed with patient certain preventive protocols, quality metrics, and best practice recommendations. A written personalized care plan for preventive services as well as general preventive health recommendations were provided to patient.     Dorrene German, RN   07/29/2016   I have reviewed this note and agree with its contents.  Alysia Penna, MD

## 2016-07-29 ENCOUNTER — Ambulatory Visit (INDEPENDENT_AMBULATORY_CARE_PROVIDER_SITE_OTHER): Payer: Medicare Other

## 2016-07-29 VITALS — BP 112/60 | HR 72 | Ht 70.5 in | Wt 247.5 lb

## 2016-07-29 DIAGNOSIS — J3089 Other allergic rhinitis: Secondary | ICD-10-CM | POA: Diagnosis not present

## 2016-07-29 DIAGNOSIS — Z0000 Encounter for general adult medical examination without abnormal findings: Secondary | ICD-10-CM | POA: Diagnosis not present

## 2016-07-29 DIAGNOSIS — J301 Allergic rhinitis due to pollen: Secondary | ICD-10-CM | POA: Diagnosis not present

## 2016-07-29 NOTE — Patient Instructions (Addendum)
[name] [name] , Thank you for taking time to come for your Medicare Wellness Visit. I appreciate your ongoing commitment to your health goals. Please review the following plan we discussed and let me know if I can assist you in the future.   Bring a copy of your advance directives to your next office visit.  Try to avoid long periods of sitting down at your computer. Get up and stretch your legs every now and then!  Check your BP at home at various times. Keep a log of these readings and bring it to your next appointment with Dr. Sarajane Jews.   Start wearing your CPAP nightly.   Consider the shingles vaccine, Shingrix. This is available at most pharmacies.  These are the goals we discussed: Goals    . Increase physical activity          [facility]       This is a list of the screening recommended for you and due dates:  Health Maintenance  Topic Date Due  . Tetanus Vaccine  07/07/201961  . Flu Shot  09/15/2016  . Colon Cancer Screening  03/14/2017  . Pneumonia vaccines  Completed    Preventive Care 76 Years and Older, Male Preventive care refers to lifestyle choices and visits with your health care provider that can promote health and wellness. What does preventive care include?  A yearly physical exam. This is also called an annual well check.  Dental exams once or twice a year.  Routine eye exams. Ask your health care provider how often you should have your eyes checked.  Personal lifestyle choices, including: ? Daily care of your teeth and gums. ? Regular physical activity. ? Eating a healthy diet. ? Avoiding tobacco and drug use. ? Limiting alcohol use. ? Practicing safe sex. ? Taking low doses of aspirin every day. ? Taking vitamin and mineral supplements as recommended by your health care provider. What happens during an annual well check? The services and screenings done by your health care provider during your annual well check will  depend on your age, overall health, lifestyle risk factors, and family history of disease. Counseling Your health care provider may ask you questions about your:  Alcohol use.  Tobacco use.  Drug use.  Emotional well-being.  Home and relationship well-being.  Sexual activity.  Eating habits.  History of falls.  Memory and ability to understand (cognition).  Work and work Statistician.  Screening You may have the following tests or measurements:  Height, weight, and BMI.  Blood pressure.  Lipid and cholesterol levels. These may be checked every 5 years, or more frequently if you are over 72 years old.  Skin check.  Lung cancer screening. You may have this screening every year starting at age 76 if you have a 30-pack-year history of smoking and currently smoke or have quit within the past 15 years.  Fecal occult blood test (FOBT) of the stool. You may have this test every year starting at age 76.  Flexible sigmoidoscopy or colonoscopy. You may have a sigmoidoscopy every 5 years or a colonoscopy every 10 years starting at age 80.  Prostate cancer screening. Recommendations will vary depending on your family history and other risks.  Hepatitis C blood test.  Hepatitis B blood test.  Sexually transmitted disease (STD) testing.  Diabetes screening. This is done by checking your blood sugar (glucose) after you have not eaten for a while (fasting). You may have this  done every 1-3 years.  Abdominal aortic aneurysm (AAA) screening. You may need this if you are a current or former smoker.  Osteoporosis. You may be screened starting at age 76 if you are at high risk.  Talk with your health care provider about your test results, treatment options, and if necessary, the need for more tests. Vaccines Your health care provider may recommend certain vaccines, such as:  Influenza vaccine. This is recommended every year.  Tetanus, diphtheria, and acellular pertussis (Tdap,  Td) vaccine. You may need a Td booster every 10 years.  Varicella vaccine. You may need this if you have not been vaccinated.  Zoster vaccine. You may need this after age 8.  Measles, mumps, and rubella (MMR) vaccine. You may need at least one dose of MMR if you were born in 1957 or later. You may also need a second dose.  Pneumococcal 13-valent conjugate (PCV13) vaccine. One dose is recommended after age 63.  Pneumococcal polysaccharide (PPSV23) vaccine. One dose is recommended after age 17.  Meningococcal vaccine. You may need this if you have certain conditions.  Hepatitis A vaccine. You may need this if you have certain conditions or if you travel or work in places where you may be exposed to hepatitis A.  Hepatitis B vaccine. You may need this if you have certain conditions or if you travel or work in places where you may be exposed to hepatitis B.  Haemophilus influenzae type b (Hib) vaccine. You may need this if you have certain risk factors.  Talk to your health care provider about which screenings and vaccines you need and how often you need them. This information is not intended to replace advice given to you by your health care provider. Make sure you discuss any questions you have with your health care provider. Document Released: 02/28/2015 Document Revised: 10/22/2015 Document Reviewed: 12/03/2014 Elsevier Interactive Patient Education  2017 [employer].

## 2016-08-03 DIAGNOSIS — J301 Allergic rhinitis due to pollen: Secondary | ICD-10-CM | POA: Diagnosis not present

## 2016-08-03 DIAGNOSIS — J3089 Other allergic rhinitis: Secondary | ICD-10-CM | POA: Diagnosis not present

## 2016-08-09 DIAGNOSIS — N401 Enlarged prostate with lower urinary tract symptoms: Secondary | ICD-10-CM | POA: Diagnosis not present

## 2016-08-09 DIAGNOSIS — R3915 Urgency of urination: Secondary | ICD-10-CM | POA: Diagnosis not present

## 2016-08-09 DIAGNOSIS — R3129 Other microscopic hematuria: Secondary | ICD-10-CM | POA: Diagnosis not present

## 2016-08-16 ENCOUNTER — Ambulatory Visit: Payer: Medicare Other | Admitting: Family Medicine

## 2016-08-17 DIAGNOSIS — J3089 Other allergic rhinitis: Secondary | ICD-10-CM | POA: Diagnosis not present

## 2016-08-17 DIAGNOSIS — N132 Hydronephrosis with renal and ureteral calculous obstruction: Secondary | ICD-10-CM | POA: Diagnosis not present

## 2016-08-17 DIAGNOSIS — R3129 Other microscopic hematuria: Secondary | ICD-10-CM | POA: Diagnosis not present

## 2016-08-17 DIAGNOSIS — J301 Allergic rhinitis due to pollen: Secondary | ICD-10-CM | POA: Diagnosis not present

## 2016-08-23 ENCOUNTER — Ambulatory Visit (INDEPENDENT_AMBULATORY_CARE_PROVIDER_SITE_OTHER): Payer: Medicare Other | Admitting: Family Medicine

## 2016-08-23 ENCOUNTER — Encounter: Payer: Self-pay | Admitting: Family Medicine

## 2016-08-23 VITALS — BP 114/66 | HR 92 | Temp 98.3°F | Ht 70.5 in | Wt 248.0 lb

## 2016-08-23 DIAGNOSIS — I10 Essential (primary) hypertension: Secondary | ICD-10-CM | POA: Diagnosis not present

## 2016-08-23 NOTE — Progress Notes (Signed)
   Subjective:    Patient ID: [name] [name], male    DOB: 21-Apr-1940, 76 y.o.   MRN: [id number]  HPI Here to ask about BP. He has been taking Amlodipine and Benazepril each morning as usual, but several weeks ago he decreased his Metoprolol from twice a day to once a day in the evenings. This was because he was getting some loww BP readings at home in the range of 563S to 937D systolic and he was having some mild lightheaded spells. He says his BP is still about the same at home. No chest pain or SOB.   Review of Systems  Constitutional: Negative.   Respiratory: Negative.   Cardiovascular: Negative.   Neurological: Positive for light-headedness. Negative for dizziness.       Objective:   Physical Exam  Constitutional: He is oriented to person, place, and time. He appears well-developed and well-nourished.  Neck: No thyromegaly present.  Cardiovascular: Normal rate, regular rhythm, normal heart sounds and intact distal pulses.   Pulmonary/Chest: Effort normal and breath sounds normal. No respiratory distress. He has no wheezes. He has no rales.  Musculoskeletal: He exhibits no edema.  Lymphadenopathy:    He has no cervical adenopathy.  Neurological: He is alert and oriented to person, place, and time.          Assessment & Plan:  His HTN is still a bit overtreated so we will stop the Metoprolol completely. Stay on the other meds as usual. Recheck in 2 weeks.  Alysia Penna, MD

## 2016-08-23 NOTE — Patient Instructions (Signed)
WE NOW OFFER   [facility] FAST TRACK!!!  SAME DAY Appointments for ACUTE CARE  Such as: Sprains, Injuries, cuts, abrasions, rashes, muscle pain, joint pain, back pain Colds, flu, sore throats, headache, allergies, cough, fever  Ear pain, sinus and eye infections Abdominal pain, nausea, vomiting, diarrhea, upset stomach Animal/insect bites  3 Easy Ways to Schedule: Walk-In Scheduling Call in scheduling Mychart Sign-up: [email or web address]         

## 2016-08-27 DIAGNOSIS — J3089 Other allergic rhinitis: Secondary | ICD-10-CM | POA: Diagnosis not present

## 2016-08-27 DIAGNOSIS — J301 Allergic rhinitis due to pollen: Secondary | ICD-10-CM | POA: Diagnosis not present

## 2016-08-27 DIAGNOSIS — N401 Enlarged prostate with lower urinary tract symptoms: Secondary | ICD-10-CM | POA: Diagnosis not present

## 2016-08-27 DIAGNOSIS — N202 Calculus of kidney with calculus of ureter: Secondary | ICD-10-CM | POA: Diagnosis not present

## 2016-08-27 DIAGNOSIS — N201 Calculus of ureter: Secondary | ICD-10-CM | POA: Diagnosis not present

## 2016-08-31 ENCOUNTER — Ambulatory Visit (INDEPENDENT_AMBULATORY_CARE_PROVIDER_SITE_OTHER): Payer: Medicare Other | Admitting: Sports Medicine

## 2016-08-31 DIAGNOSIS — B351 Tinea unguium: Secondary | ICD-10-CM

## 2016-08-31 DIAGNOSIS — L84 Corns and callosities: Secondary | ICD-10-CM | POA: Diagnosis not present

## 2016-08-31 DIAGNOSIS — M79676 Pain in unspecified toe(s): Secondary | ICD-10-CM | POA: Diagnosis not present

## 2016-08-31 NOTE — Progress Notes (Signed)
Patient ID: [name] [name], male   DOB: [birth year], 76 y.o.   MRN: [id number] Subjective: [name] [name] is a 75 y.o. male patient seen today in office with complaint of callus and thickened and elongated toenails; unable to trim. Patient denies any changes with medical history since last visit. Patient has no other pedal complaints at this time.   Patient Active Problem List   Diagnosis Date Noted  . Arthritis of knee 12/23/2014  . Primary osteoarthritis of left knee 12/21/2014  . OSA (obstructive sleep apnea) 05/27/2011  . PRIMARY HYPERPARATHYROIDISM 12/25/2009  . KNEE PAIN, LEFT 12/25/2009  . PLANTAR FASCIITIS 12/16/2009  . ACUTE PROSTATITIS 01/14/2009  . ANEMIA-IRON DEFICIENCY 01/09/2008  . ACUTE SINUSITIS, UNSPECIFIED 07/31/2007  . HYPERLIPIDEMIA 12/16/2006  . ANXIETY 12/16/2006  . ERECTILE DYSFUNCTION 12/16/2006  . Essential hypertension 12/16/2006  . ALLERGIC RHINITIS 12/16/2006  . ASTHMA 12/16/2006  . GERD 12/16/2006  . COLONIC POLYPS, HX OF 12/16/2006  . BENIGN PROSTATIC HYPERTROPHY, HX OF 12/16/2006  . PARONYCHIA, FINGER 10/11/2006    Current Outpatient Prescriptions on File Prior to Visit  Medication Sig Dispense Refill  . alendronate (FOSAMAX) 70 MG tablet TAKE 1 TABLET BY MOUTH EVERY 7 DAYS. TAKE WITH FULL GLASS OF WATER ON AN EMPTY STOMACH 12 tablet 3  . amLODipine (NORVASC) 10 MG tablet TAKE 1 TABLET (10 MG TOTAL) BY MOUTH DAILY. 90 tablet 3  . aspirin 81 MG tablet Take 81 mg by mouth daily as needed for pain.     . benazepril (LOTENSIN) 10 MG tablet Take 1 tablet (10 mg total) by mouth daily. 90 tablet 3  . cetirizine (ZYRTEC) 10 MG tablet Take 10 mg by mouth daily.     . ferrous sulfate 325 (65 FE) MG tablet TAKE 1 TABLET BY MOUTH 2 TIMES DAILY WITH A MEAL. 60 tablet 11  . fish oil-omega-3 fatty acids 1000 MG capsule Take 2 g by mouth daily.      . metoprolol (LOPRESSOR) 50 MG tablet TAKE 1 TABLET (50 MG TOTAL) BY MOUTH 2 (TWO) TIMES DAILY. 180 tablet 3  .  montelukast (SINGULAIR) 10 MG tablet Take 1 tablet (10 mg total) by mouth every evening. 90 tablet 3  . Multiple Vitamin (MULTIVITAMIN) tablet Take 1 tablet by mouth daily.    Marland Kitchen omeprazole (PRILOSEC) 20 MG capsule TAKE 1 CAPSULE (20 MG TOTAL) BY MOUTH DAILY. 90 capsule 2  . silodosin (RAPAFLO) 8 MG CAPS capsule Take 1 capsule (8 mg total) by mouth daily with breakfast. 30 capsule 5  . simvastatin (ZOCOR) 20 MG tablet TAKE 1 TABLET (20 MG TOTAL) BY MOUTH DAILY. 90 tablet 3  . tadalafil (CIALIS) 5 MG tablet Once daily for BPH 90 tablet 3  . tamsulosin (FLOMAX) 0.4 MG CAPS capsule TAKE 1 CAPSULE (0.4 MG TOTAL) BY MOUTH DAILY. (Patient not taking: Reported on 08/23/2016) 90 capsule 3  . [DISCONTINUED] amLODipine-benazepril (LOTREL) 10-20 MG per capsule Take 1 capsule by mouth daily. 30 capsule 11   No current facility-administered medications on file prior to visit.    No Known Allergies   Objective: Physical Exam  General: Well developed, nourished, no acute distress, awake, alert and oriented x 3  Vascular: Dorsalis pedis artery 2/4 bilateral, Posterior tibial artery 2/4 bilateral, skin temperature warm to warm proximal to distal bilateral lower extremities, no varicosities, pedal hair present bilateral.  Neurological: Epicritic sensation intact via 5.07 Semmes Weinstein at all pedal sites, vibratory sensation intact bilateral.  Dermatological: Skin is warm, dry, and supple  bilateral, Nails 1-10 are tender, long, thick, and discolored with mild subungal debris, no webspace macerations present bilateral, no open lesions present bilateral, Hyperkeratotic tissue present sub 1st & 5th metatarsal heads on right and sub 3rd metatarsal on left with no signs of infection bilateral.  Musculoskeletal: Asymptomatic bunion and hammertes bilateral. Muscular strength within normal limits without pain or limitation on range of motion. No pain with calf compression bilateral.  Assessment and Plan:  Problem  List Items Addressed This Visit    None    Visit Diagnoses    Dermatophytosis of nail    -  Primary   Corns and callosities       Pain of toe, unspecified laterality         -Examination performed. -Discussed treatment options for painful mycotic nails & callus skin. -Mechanically debrided and reduced mycotic nails with sterile nail nipper and dremel nail file without incident. -Debrided callouses x 3 using sterile chisel blade without incident. -Recommend cont with skin emollient for dry calloused skin.  -Recommend tea tree oil to nails -Recommend cont with good supportive shoes daily -Patient to return in 62 days for follow up evaluation or sooner if symptoms worsen.  Landis Martins, DPM

## 2016-08-31 NOTE — Patient Instructions (Signed)
Tea tree oil to nails

## 2016-09-03 DIAGNOSIS — J3089 Other allergic rhinitis: Secondary | ICD-10-CM | POA: Diagnosis not present

## 2016-09-03 DIAGNOSIS — J301 Allergic rhinitis due to pollen: Secondary | ICD-10-CM | POA: Diagnosis not present

## 2016-09-09 DIAGNOSIS — J301 Allergic rhinitis due to pollen: Secondary | ICD-10-CM | POA: Diagnosis not present

## 2016-09-09 DIAGNOSIS — J3089 Other allergic rhinitis: Secondary | ICD-10-CM | POA: Diagnosis not present

## 2016-09-14 DIAGNOSIS — Z5181 Encounter for therapeutic drug level monitoring: Secondary | ICD-10-CM | POA: Diagnosis not present

## 2016-09-30 DIAGNOSIS — J3089 Other allergic rhinitis: Secondary | ICD-10-CM | POA: Diagnosis not present

## 2016-09-30 DIAGNOSIS — J301 Allergic rhinitis due to pollen: Secondary | ICD-10-CM | POA: Diagnosis not present

## 2016-10-01 DIAGNOSIS — N201 Calculus of ureter: Secondary | ICD-10-CM | POA: Diagnosis not present

## 2016-10-12 DIAGNOSIS — J3089 Other allergic rhinitis: Secondary | ICD-10-CM | POA: Diagnosis not present

## 2016-10-12 DIAGNOSIS — J301 Allergic rhinitis due to pollen: Secondary | ICD-10-CM | POA: Diagnosis not present

## 2016-10-20 DIAGNOSIS — N200 Calculus of kidney: Secondary | ICD-10-CM | POA: Diagnosis not present

## 2016-10-26 DIAGNOSIS — J3089 Other allergic rhinitis: Secondary | ICD-10-CM | POA: Diagnosis not present

## 2016-10-26 DIAGNOSIS — J301 Allergic rhinitis due to pollen: Secondary | ICD-10-CM | POA: Diagnosis not present

## 2016-11-02 ENCOUNTER — Ambulatory Visit: Payer: Medicare Other | Admitting: Sports Medicine

## 2016-11-04 ENCOUNTER — Encounter: Payer: Self-pay | Admitting: Family Medicine

## 2016-11-04 DIAGNOSIS — J3089 Other allergic rhinitis: Secondary | ICD-10-CM | POA: Diagnosis not present

## 2016-11-04 DIAGNOSIS — J301 Allergic rhinitis due to pollen: Secondary | ICD-10-CM | POA: Diagnosis not present

## 2016-11-05 DIAGNOSIS — N201 Calculus of ureter: Secondary | ICD-10-CM | POA: Diagnosis not present

## 2016-11-09 ENCOUNTER — Other Ambulatory Visit: Payer: Self-pay | Admitting: Family Medicine

## 2016-11-16 ENCOUNTER — Encounter: Payer: Self-pay | Admitting: Sports Medicine

## 2016-11-16 ENCOUNTER — Ambulatory Visit (INDEPENDENT_AMBULATORY_CARE_PROVIDER_SITE_OTHER): Payer: Medicare Other | Admitting: Sports Medicine

## 2016-11-16 DIAGNOSIS — B351 Tinea unguium: Secondary | ICD-10-CM | POA: Diagnosis not present

## 2016-11-16 DIAGNOSIS — L84 Corns and callosities: Secondary | ICD-10-CM

## 2016-11-16 DIAGNOSIS — M79676 Pain in unspecified toe(s): Secondary | ICD-10-CM | POA: Diagnosis not present

## 2016-11-16 NOTE — Progress Notes (Signed)
Patient ID: [name] [name], male   DOB: 1940-05-23, 76 y.o.   MRN: [id number] Subjective: [name] [name] is a 76 y.o. male patient seen today in office with complaint of callus and thickened and elongated toenails; unable to trim. Patient denies any changes with medical history since last visit. Patient has no other pedal complaints at this time.   Patient Active Problem List   Diagnosis Date Noted  . Arthritis of knee 12/23/2014  . Primary osteoarthritis of left knee 12/21/2014  . OSA (obstructive sleep apnea) 05/27/2011  . PRIMARY HYPERPARATHYROIDISM 12/25/2009  . KNEE PAIN, LEFT 12/25/2009  . PLANTAR FASCIITIS 12/16/2009  . ACUTE PROSTATITIS 01/14/2009  . ANEMIA-IRON DEFICIENCY 01/09/2008  . ACUTE SINUSITIS, UNSPECIFIED 07/31/2007  . HYPERLIPIDEMIA 12/16/2006  . ANXIETY 12/16/2006  . ERECTILE DYSFUNCTION 12/16/2006  . Essential hypertension 12/16/2006  . ALLERGIC RHINITIS 12/16/2006  . ASTHMA 12/16/2006  . GERD 12/16/2006  . COLONIC POLYPS, HX OF 12/16/2006  . BENIGN PROSTATIC HYPERTROPHY, HX OF 12/16/2006  . PARONYCHIA, FINGER 10/11/2006    Current Outpatient Prescriptions on File Prior to Visit  Medication Sig Dispense Refill  . alendronate (FOSAMAX) 70 MG tablet TAKE 1 TABLET BY MOUTH EVERY 7 DAYS. TAKE WITH FULL GLASS OF WATER ON AN EMPTY STOMACH 12 tablet 3  . amLODipine (NORVASC) 10 MG tablet TAKE 1 TABLET (10 MG TOTAL) BY MOUTH DAILY. 90 tablet 3  . aspirin 81 MG tablet Take 81 mg by mouth daily as needed for pain.     . benazepril (LOTENSIN) 10 MG tablet Take 1 tablet (10 mg total) by mouth daily. 90 tablet 3  . cetirizine (ZYRTEC) 10 MG tablet Take 10 mg by mouth daily.     . ferrous sulfate 325 (65 FE) MG tablet TAKE 1 TABLET BY MOUTH 2 TIMES DAILY WITH A MEAL. 60 tablet 11  . fish oil-omega-3 fatty acids 1000 MG capsule Take 2 g by mouth daily.      . metoprolol (LOPRESSOR) 50 MG tablet TAKE 1 TABLET (50 MG TOTAL) BY MOUTH 2 (TWO) TIMES DAILY. 180 tablet 3  .  montelukast (SINGULAIR) 10 MG tablet Take 1 tablet (10 mg total) by mouth every evening. 90 tablet 3  . Multiple Vitamin (MULTIVITAMIN) tablet Take 1 tablet by mouth daily.    Marland Kitchen omeprazole (PRILOSEC) 20 MG capsule TAKE 1 CAPSULE (20 MG TOTAL) BY MOUTH DAILY. 90 capsule 2  . RAPAFLO 8 MG CAPS capsule TAKE 1 CAPSULE (8 MG TOTAL) BY MOUTH DAILY WITH BREAKFAST. 30 capsule 1  . simvastatin (ZOCOR) 20 MG tablet TAKE 1 TABLET (20 MG TOTAL) BY MOUTH DAILY. 90 tablet 3  . tadalafil (CIALIS) 5 MG tablet Once daily for BPH 90 tablet 3  . tamsulosin (FLOMAX) 0.4 MG CAPS capsule TAKE 1 CAPSULE (0.4 MG TOTAL) BY MOUTH DAILY. 90 capsule 3  . [DISCONTINUED] amLODipine-benazepril (LOTREL) 10-20 MG per capsule Take 1 capsule by mouth daily. 30 capsule 11   No current facility-administered medications on file prior to visit.    No Known Allergies   Objective: Physical Exam  General: Well developed, nourished, no acute distress, awake, alert and oriented x 3  Vascular: Dorsalis pedis artery 2/4 bilateral, Posterior tibial artery 2/4 bilateral, skin temperature warm to warm proximal to distal bilateral lower extremities, no varicosities, pedal hair present bilateral.  Neurological: Epicritic sensation intact via 5.07 Semmes Weinstein at all pedal sites, vibratory sensation intact bilateral.  Dermatological: Skin is warm, dry, and supple bilateral, Nails 1-10 are tender, long, thick,  and discolored with mild subungal debris, no webspace macerations present bilateral, no open lesions present bilateral, Hyperkeratotic tissue present sub 1st & 5th metatarsal heads on right and sub 3rd metatarsal on left with no signs of infection bilateral.  Musculoskeletal: Asymptomatic bunion and hammertes bilateral. Muscular strength within normal limits without pain or limitation on range of motion. No pain with calf compression bilateral.  Assessment and Plan:  Problem List Items Addressed This Visit    None    Visit  Diagnoses    Dermatophytosis of nail    -  Primary   Corns and callosities       Pain of toe, unspecified laterality         -Examination performed. -Discussed treatment options for painful mycotic nails & callus skin. -Mechanically debrided and reduced mycotic nails with sterile nail nipper and dremel nail file without incident. -Debrided callouses x 3 using sterile chisel blade without incident. -Recommend cont with skin emollient for dry calloused skin, [facility] feet.  -Recommend tea tree oil to nails as previous  -Recommend cont with good supportive shoes daily -Patient to return in 62 days for follow up evaluation or sooner if symptoms worsen.  Landis Martins, DPM

## 2016-11-16 NOTE — Patient Instructions (Signed)
Recommend to use pomice stone to dry/callus skin areas and [employer] [facility] feet cream after bath or shower

## 2016-11-19 DIAGNOSIS — J3089 Other allergic rhinitis: Secondary | ICD-10-CM | POA: Diagnosis not present

## 2016-11-19 DIAGNOSIS — J301 Allergic rhinitis due to pollen: Secondary | ICD-10-CM | POA: Diagnosis not present

## 2016-11-30 DIAGNOSIS — J3089 Other allergic rhinitis: Secondary | ICD-10-CM | POA: Diagnosis not present

## 2016-11-30 DIAGNOSIS — J301 Allergic rhinitis due to pollen: Secondary | ICD-10-CM | POA: Diagnosis not present

## 2016-12-10 DIAGNOSIS — J301 Allergic rhinitis due to pollen: Secondary | ICD-10-CM | POA: Diagnosis not present

## 2016-12-10 DIAGNOSIS — J3089 Other allergic rhinitis: Secondary | ICD-10-CM | POA: Diagnosis not present

## 2016-12-17 DIAGNOSIS — J3089 Other allergic rhinitis: Secondary | ICD-10-CM | POA: Diagnosis not present

## 2016-12-17 DIAGNOSIS — J301 Allergic rhinitis due to pollen: Secondary | ICD-10-CM | POA: Diagnosis not present

## 2016-12-21 DIAGNOSIS — H524 Presbyopia: Secondary | ICD-10-CM | POA: Diagnosis not present

## 2016-12-21 DIAGNOSIS — H2513 Age-related nuclear cataract, bilateral: Secondary | ICD-10-CM | POA: Diagnosis not present

## 2016-12-23 DIAGNOSIS — J3089 Other allergic rhinitis: Secondary | ICD-10-CM | POA: Diagnosis not present

## 2016-12-23 DIAGNOSIS — J301 Allergic rhinitis due to pollen: Secondary | ICD-10-CM | POA: Diagnosis not present

## 2017-01-14 DIAGNOSIS — J301 Allergic rhinitis due to pollen: Secondary | ICD-10-CM | POA: Diagnosis not present

## 2017-01-14 DIAGNOSIS — J3089 Other allergic rhinitis: Secondary | ICD-10-CM | POA: Diagnosis not present

## 2017-01-18 ENCOUNTER — Encounter: Payer: Self-pay | Admitting: Sports Medicine

## 2017-01-18 ENCOUNTER — Ambulatory Visit: Payer: Medicare Other | Admitting: Sports Medicine

## 2017-01-18 DIAGNOSIS — B351 Tinea unguium: Secondary | ICD-10-CM

## 2017-01-18 DIAGNOSIS — M79676 Pain in unspecified toe(s): Secondary | ICD-10-CM | POA: Diagnosis not present

## 2017-01-18 DIAGNOSIS — L84 Corns and callosities: Secondary | ICD-10-CM | POA: Diagnosis not present

## 2017-01-18 NOTE — Progress Notes (Signed)
Patient ID: [name] [name], male   DOB: 10/01/1940, 76 y.o.   MRN: [id number] Subjective: [name] [name] is a 76 y.o. male patient seen today in office with complaint of callus and thickened and elongated toenails; unable to trim. Patient denies any changes with medical history since last visit. Patient has no other pedal complaints at this time.   Patient Active Problem List   Diagnosis Date Noted  . Arthritis of knee 12/23/2014  . Primary osteoarthritis of left knee 12/21/2014  . OSA (obstructive sleep apnea) 05/27/2011  . PRIMARY HYPERPARATHYROIDISM 12/25/2009  . KNEE PAIN, LEFT 12/25/2009  . PLANTAR FASCIITIS 12/16/2009  . ACUTE PROSTATITIS 01/14/2009  . ANEMIA-IRON DEFICIENCY 01/09/2008  . ACUTE SINUSITIS, UNSPECIFIED 07/31/2007  . HYPERLIPIDEMIA 12/16/2006  . ANXIETY 12/16/2006  . ERECTILE DYSFUNCTION 12/16/2006  . Essential hypertension 12/16/2006  . ALLERGIC RHINITIS 12/16/2006  . ASTHMA 12/16/2006  . GERD 12/16/2006  . COLONIC POLYPS, HX OF 12/16/2006  . BENIGN PROSTATIC HYPERTROPHY, HX OF 12/16/2006  . PARONYCHIA, FINGER 10/11/2006    Current Outpatient Medications on File Prior to Visit  Medication Sig Dispense Refill  . alendronate (FOSAMAX) 70 MG tablet TAKE 1 TABLET BY MOUTH EVERY 7 DAYS. TAKE WITH FULL GLASS OF WATER ON AN EMPTY STOMACH 12 tablet 3  . amLODipine (NORVASC) 10 MG tablet TAKE 1 TABLET (10 MG TOTAL) BY MOUTH DAILY. 90 tablet 3  . aspirin 81 MG tablet Take 81 mg by mouth daily as needed for pain.     . benazepril (LOTENSIN) 10 MG tablet Take 1 tablet (10 mg total) by mouth daily. 90 tablet 3  . cetirizine (ZYRTEC) 10 MG tablet Take 10 mg by mouth daily.     . ferrous sulfate 325 (65 FE) MG tablet TAKE 1 TABLET BY MOUTH 2 TIMES DAILY WITH A MEAL. 60 tablet 11  . fish oil-omega-3 fatty acids 1000 MG capsule Take 2 g by mouth daily.      . metoprolol (LOPRESSOR) 50 MG tablet TAKE 1 TABLET (50 MG TOTAL) BY MOUTH 2 (TWO) TIMES DAILY. 180 tablet 3  .  montelukast (SINGULAIR) 10 MG tablet Take 1 tablet (10 mg total) by mouth every evening. 90 tablet 3  . Multiple Vitamin (MULTIVITAMIN) tablet Take 1 tablet by mouth daily.    Marland Kitchen omeprazole (PRILOSEC) 20 MG capsule TAKE 1 CAPSULE (20 MG TOTAL) BY MOUTH DAILY. 90 capsule 2  . RAPAFLO 8 MG CAPS capsule TAKE 1 CAPSULE (8 MG TOTAL) BY MOUTH DAILY WITH BREAKFAST. 30 capsule 1  . simvastatin (ZOCOR) 20 MG tablet TAKE 1 TABLET (20 MG TOTAL) BY MOUTH DAILY. 90 tablet 3  . tadalafil (CIALIS) 5 MG tablet Once daily for BPH 90 tablet 3  . tamsulosin (FLOMAX) 0.4 MG CAPS capsule TAKE 1 CAPSULE (0.4 MG TOTAL) BY MOUTH DAILY. 90 capsule 3  . [DISCONTINUED] amLODipine-benazepril (LOTREL) 10-20 MG per capsule Take 1 capsule by mouth daily. 30 capsule 11   No current facility-administered medications on file prior to visit.    No Known Allergies   Objective: Physical Exam  General: Well developed, nourished, no acute distress, awake, alert and oriented x 3  Vascular: Dorsalis pedis artery 2/4 bilateral, Posterior tibial artery 2/4 bilateral, skin temperature warm to warm proximal to distal bilateral lower extremities, no varicosities, pedal hair present bilateral.  Neurological: Epicritic sensation intact via 5.07 Semmes Weinstein at all pedal sites, vibratory sensation intact bilateral.  Dermatological: Skin is warm, dry, and supple bilateral, Nails 1-10 are tender, long, thick,  and discolored with mild subungal debris, no webspace macerations present bilateral, no open lesions present bilateral, Hyperkeratotic tissue present sub 1st & 5th metatarsal heads on right and sub 3rd metatarsal on left with no signs of infection bilateral.  Musculoskeletal: Asymptomatic bunion and hammertes bilateral. Muscular strength within normal limits without pain or limitation on range of motion. No pain with calf compression bilateral.  Assessment and Plan:  Problem List Items Addressed This Visit    None    Visit  Diagnoses    Dermatophytosis of nail    -  Primary   Corns and callosities       Pain of toe, unspecified laterality         -Examination performed. -Discussed treatment options for painful mycotic nails & callus skin. -Mechanically debrided and reduced mycotic nails with sterile nail nipper and dremel nail file without incident. -Debrided callouses x 3 using sterile chisel blade without incident. -Recommend cont with skin emollient for dry calloused skin, [facility] feet.  -Recommend tea tree oil to nails as previous  -Recommend cont with good supportive shoes daily -Patient to return in 62 days for follow up evaluation or sooner if symptoms worsen.  Landis Martins, DPM

## 2017-01-21 DIAGNOSIS — J301 Allergic rhinitis due to pollen: Secondary | ICD-10-CM | POA: Diagnosis not present

## 2017-01-21 DIAGNOSIS — J3089 Other allergic rhinitis: Secondary | ICD-10-CM | POA: Diagnosis not present

## 2017-01-27 DIAGNOSIS — J3089 Other allergic rhinitis: Secondary | ICD-10-CM | POA: Diagnosis not present

## 2017-01-27 DIAGNOSIS — J301 Allergic rhinitis due to pollen: Secondary | ICD-10-CM | POA: Diagnosis not present

## 2017-02-05 ENCOUNTER — Other Ambulatory Visit: Payer: Self-pay | Admitting: Family Medicine

## 2017-02-07 ENCOUNTER — Other Ambulatory Visit: Payer: Self-pay

## 2017-02-07 NOTE — Telephone Encounter (Signed)
RF Alendronate sodium 70 MG tab 90 day supply. Rx sent

## 2017-02-17 DIAGNOSIS — J3089 Other allergic rhinitis: Secondary | ICD-10-CM | POA: Diagnosis not present

## 2017-02-17 DIAGNOSIS — J301 Allergic rhinitis due to pollen: Secondary | ICD-10-CM | POA: Diagnosis not present

## 2017-02-21 ENCOUNTER — Other Ambulatory Visit: Payer: Self-pay | Admitting: Family Medicine

## 2017-02-23 DIAGNOSIS — M1711 Unilateral primary osteoarthritis, right knee: Secondary | ICD-10-CM | POA: Diagnosis not present

## 2017-02-23 DIAGNOSIS — Z96652 Presence of left artificial knee joint: Secondary | ICD-10-CM | POA: Diagnosis not present

## 2017-02-23 DIAGNOSIS — Z09 Encounter for follow-up examination after completed treatment for conditions other than malignant neoplasm: Secondary | ICD-10-CM | POA: Diagnosis not present

## 2017-02-24 DIAGNOSIS — J3089 Other allergic rhinitis: Secondary | ICD-10-CM | POA: Diagnosis not present

## 2017-02-24 DIAGNOSIS — M1711 Unilateral primary osteoarthritis, right knee: Secondary | ICD-10-CM | POA: Diagnosis not present

## 2017-02-24 DIAGNOSIS — M25562 Pain in left knee: Secondary | ICD-10-CM | POA: Diagnosis not present

## 2017-02-24 DIAGNOSIS — J301 Allergic rhinitis due to pollen: Secondary | ICD-10-CM | POA: Diagnosis not present

## 2017-03-01 ENCOUNTER — Encounter: Payer: Medicare Other | Admitting: Family Medicine

## 2017-03-02 ENCOUNTER — Encounter: Payer: Self-pay | Admitting: Family Medicine

## 2017-03-02 ENCOUNTER — Ambulatory Visit (INDEPENDENT_AMBULATORY_CARE_PROVIDER_SITE_OTHER): Payer: Medicare Other | Admitting: Family Medicine

## 2017-03-02 VITALS — BP 130/72 | HR 75 | Temp 97.9°F | Ht 71.0 in | Wt 236.2 lb

## 2017-03-02 DIAGNOSIS — N401 Enlarged prostate with lower urinary tract symptoms: Secondary | ICD-10-CM | POA: Diagnosis not present

## 2017-03-02 DIAGNOSIS — I10 Essential (primary) hypertension: Secondary | ICD-10-CM

## 2017-03-02 DIAGNOSIS — R338 Other retention of urine: Secondary | ICD-10-CM

## 2017-03-02 DIAGNOSIS — E785 Hyperlipidemia, unspecified: Secondary | ICD-10-CM | POA: Diagnosis not present

## 2017-03-02 DIAGNOSIS — M1712 Unilateral primary osteoarthritis, left knee: Secondary | ICD-10-CM | POA: Diagnosis not present

## 2017-03-02 DIAGNOSIS — K219 Gastro-esophageal reflux disease without esophagitis: Secondary | ICD-10-CM | POA: Diagnosis not present

## 2017-03-02 LAB — LIPID PANEL
CHOL/HDL RATIO: 2
Cholesterol: 169 mg/dL (ref 0–200)
HDL: 68.7 mg/dL (ref >39.00)
LDL Cholesterol: 85 mg/dL (ref 0–99)
NONHDL: 100.04
Triglycerides: 77 mg/dL (ref 0.0–149.0)
VLDL: 15.4 mg/dL (ref 0.0–40.0)

## 2017-03-02 LAB — CBC WITH DIFFERENTIAL/PLATELET
Basophils Absolute: 0.1 10*3/uL (ref 0.0–0.1)
Basophils Relative: 1.1 % (ref 0.0–3.0)
EOS PCT: 4.7 % (ref 0.0–5.0)
Eosinophils Absolute: 0.3 10*3/uL (ref 0.0–0.7)
HCT: 39.1 % (ref 39.0–52.0)
Hemoglobin: 12.6 g/dL — ABNORMAL LOW (ref 13.0–17.0)
LYMPHS PCT: 35.9 % (ref 12.0–46.0)
Lymphs Abs: 2 10*3/uL (ref 0.7–4.0)
MCHC: 32.4 g/dL (ref 30.0–36.0)
MCV: 92.6 fl (ref 78.0–100.0)
MONOS PCT: 7.3 % (ref 3.0–12.0)
Monocytes Absolute: 0.4 10*3/uL (ref 0.1–1.0)
Neutro Abs: 2.9 10*3/uL (ref 1.4–7.7)
Neutrophils Relative %: 51 % (ref 43.0–77.0)
Platelets: 289 10*3/uL (ref 150.0–400.0)
RBC: 4.22 Mil/uL (ref 4.22–5.81)
RDW: 14.9 % (ref 11.5–15.5)
WBC: 5.7 10*3/uL (ref 4.0–10.5)

## 2017-03-02 LAB — POC URINALSYSI DIPSTICK (AUTOMATED)
Bilirubin, UA: NEGATIVE
Blood, UA: NEGATIVE
Glucose, UA: NEGATIVE
Ketones, UA: NEGATIVE
LEUKOCYTES UA: NEGATIVE
NITRITE UA: NEGATIVE
PROTEIN UA: NEGATIVE
SPEC GRAV UA: 1.02 (ref 1.010–1.025)
UROBILINOGEN UA: 0.2 U/dL
pH, UA: 6.5 (ref 5.0–8.0)

## 2017-03-02 LAB — BASIC METABOLIC PANEL
BUN: 16 mg/dL (ref 6–23)
CHLORIDE: 105 meq/L (ref 96–112)
CO2: 31 meq/L (ref 19–32)
Calcium: 11.8 mg/dL — ABNORMAL HIGH (ref 8.4–10.5)
Creatinine, Ser: 1.14 mg/dL (ref 0.40–1.50)
GFR: 80.26 mL/min (ref >60.00)
Glucose, Bld: 101 mg/dL — ABNORMAL HIGH (ref 70–99)
POTASSIUM: 4.9 meq/L (ref 3.5–5.1)
SODIUM: 140 meq/L (ref 135–145)

## 2017-03-02 LAB — HEPATIC FUNCTION PANEL
ALT: 20 U/L (ref 0–53)
AST: 22 U/L (ref 0–37)
Albumin: 4.1 g/dL (ref 3.5–5.2)
Alkaline Phosphatase: 52 U/L (ref 39–117)
BILIRUBIN TOTAL: 0.7 mg/dL (ref 0.2–1.2)
Bilirubin, Direct: 0.1 mg/dL (ref 0.0–0.3)
Total Protein: 7 g/dL (ref 6.0–8.3)

## 2017-03-02 LAB — PSA: PSA: 2 ng/mL (ref 0.10–4.00)

## 2017-03-02 LAB — TSH: TSH: 0.88 u[IU]/mL (ref 0.35–4.50)

## 2017-03-02 MED ORDER — BENAZEPRIL HCL 10 MG PO TABS: 10.0000 mg | ORAL_TABLET | Freq: Every day | ORAL | 3 refills | Status: AC

## 2017-03-02 MED ORDER — OMEPRAZOLE 20 MG PO CPDR: 20.0000 mg | DELAYED_RELEASE_CAPSULE | Freq: Every day | ORAL | 3 refills | Status: AC

## 2017-03-02 MED ORDER — ALENDRONATE SODIUM 70 MG PO TABS: ORAL_TABLET | ORAL | 3 refills | Status: AC

## 2017-03-02 MED ORDER — AMLODIPINE BESYLATE 10 MG PO TABS: ORAL_TABLET | ORAL | 3 refills | Status: AC

## 2017-03-02 MED ORDER — SIMVASTATIN 20 MG PO TABS: ORAL_TABLET | ORAL | 3 refills | Status: AC

## 2017-03-02 MED ORDER — MONTELUKAST SODIUM 10 MG PO TABS: 10.0000 mg | ORAL_TABLET | Freq: Every evening | ORAL | 3 refills | Status: AC

## 2017-03-02 MED ORDER — METOPROLOL TARTRATE 50 MG PO TABS: 50.0000 mg | ORAL_TABLET | Freq: Two times a day (BID) | ORAL | 3 refills | Status: AC

## 2017-03-02 NOTE — Progress Notes (Signed)
   Subjective:    Patient ID: [name] [name], male    DOB: 08-17-1940, 77 y.o.   MRN: [id number]  HPI Here to follow up on issues. He feels well but he still has trouble with a slow urine stream and with urgency. No burning or discomfort. He has tried several agents for this including Flomax, Rapaflo, and others per Dr. Junious Silk but these have not helped.    Review of Systems  Constitutional: Negative.   HENT: Negative.   Eyes: Negative.   Respiratory: Negative.   Cardiovascular: Negative.   Gastrointestinal: Negative.   Genitourinary: Positive for difficulty urinating, frequency and urgency. Negative for dysuria, flank pain and hematuria.  Musculoskeletal: Negative.   Skin: Negative.   Neurological: Negative.   Psychiatric/Behavioral: Negative.        Objective:   Physical Exam  Constitutional: He is oriented to person, place, and time. He appears well-developed and well-nourished. No distress.  HENT:  Head: Normocephalic and atraumatic.  Right Ear: External ear normal.  Left Ear: External ear normal.  Nose: Nose normal.  Mouth/Throat: Oropharynx is clear and moist. No oropharyngeal exudate.  Eyes: Conjunctivae and EOM are normal. Pupils are equal, round, and reactive to light. Right eye exhibits no discharge. Left eye exhibits no discharge. No scleral icterus.  Neck: Neck supple. No JVD present. No tracheal deviation present. No thyromegaly present.  Cardiovascular: Normal rate, regular rhythm, normal heart sounds and intact distal pulses. Exam reveals no gallop and no friction rub.  No murmur heard. Pulmonary/Chest: Effort normal and breath sounds normal. No respiratory distress. He has no wheezes. He has no rales. He exhibits no tenderness.  Abdominal: Soft. Bowel sounds are normal. He exhibits no distension and no mass. There is no tenderness. There is no rebound and no guarding.  Musculoskeletal: Normal range of motion. He exhibits no edema or tenderness.    Lymphadenopathy:    He has no cervical adenopathy.  Neurological: He is alert and oriented to person, place, and time. He has normal reflexes. No cranial nerve deficit. He exhibits normal muscle tone. Coordination normal.  Skin: Skin is warm and dry. No rash noted. He is not diaphoretic. No erythema. No pallor.  Psychiatric: He has a normal mood and affect. His behavior is normal. Judgment and thought content normal.          Assessment & Plan:  His HTN is stable. Get fasting labs today to check lipids, etc. Check a CBC for the anemia. His allergies are controlled. For the BPH symptoms I asked him to follow up with Dr. Junious Silk.  Alysia Penna, MD

## 2017-03-03 DIAGNOSIS — J301 Allergic rhinitis due to pollen: Secondary | ICD-10-CM | POA: Diagnosis not present

## 2017-03-03 DIAGNOSIS — J3089 Other allergic rhinitis: Secondary | ICD-10-CM | POA: Diagnosis not present

## 2017-03-09 ENCOUNTER — Telehealth: Payer: Self-pay | Admitting: Family Medicine

## 2017-03-09 NOTE — Telephone Encounter (Signed)
His labs were all normal.

## 2017-03-09 NOTE — Telephone Encounter (Signed)
Copied from [place]. Topic: General - Other >> Mar 09, 2017  3:46 PM Neva Seat wrote: Pt is needing results from lab work done from [facility] on 03-02-17 asap

## 2017-03-09 NOTE — Telephone Encounter (Signed)
Called pt and went over lab results. Pt advised and voiced understanding. Pt wanted a copy mailed to him.  Place copy to be mailed to pt.

## 2017-03-09 NOTE — Telephone Encounter (Signed)
Sent to PCP NO notes left with pt's lab results.  Please advise.  Thanks

## 2017-03-10 DIAGNOSIS — N401 Enlarged prostate with lower urinary tract symptoms: Secondary | ICD-10-CM | POA: Diagnosis not present

## 2017-03-10 DIAGNOSIS — R350 Frequency of micturition: Secondary | ICD-10-CM | POA: Diagnosis not present

## 2017-03-15 DIAGNOSIS — J301 Allergic rhinitis due to pollen: Secondary | ICD-10-CM | POA: Diagnosis not present

## 2017-03-15 DIAGNOSIS — J3089 Other allergic rhinitis: Secondary | ICD-10-CM | POA: Diagnosis not present

## 2017-03-20 ENCOUNTER — Other Ambulatory Visit: Payer: Self-pay | Admitting: Family Medicine

## 2017-03-21 NOTE — Telephone Encounter (Signed)
Last OV 03/02/2017   Rx was last refilled 01/20/2016 disp 60 with 11 refills   CBC last tested 03/02/2017  and hemoglobin was 12.6 should be 13 to 17   OK to refill this Rx for pt?

## 2017-03-22 ENCOUNTER — Ambulatory Visit: Payer: Medicare Other | Admitting: Sports Medicine

## 2017-03-22 ENCOUNTER — Encounter: Payer: Self-pay | Admitting: Sports Medicine

## 2017-03-22 DIAGNOSIS — M79676 Pain in unspecified toe(s): Secondary | ICD-10-CM

## 2017-03-22 DIAGNOSIS — B351 Tinea unguium: Secondary | ICD-10-CM | POA: Diagnosis not present

## 2017-03-22 DIAGNOSIS — L84 Corns and callosities: Secondary | ICD-10-CM | POA: Diagnosis not present

## 2017-03-22 NOTE — Progress Notes (Signed)
Patient ID: Eloise Mula Benish, male   DOB: Oct 01, 1940, 77 y.o.   MRN: [id number] Subjective: Bohdan Macho Space is a 77 y.o. male patient seen today in office with complaint of callus and thickened and elongated toenails; unable to trim. Patient denies any changes with medical history since last visit. Patient has no other pedal complaints at this time.   Patient Active Problem List   Diagnosis Date Noted  . Arthritis of knee 12/23/2014  . Primary osteoarthritis of left knee 12/21/2014  . OSA (obstructive sleep apnea) 05/27/2011  . PRIMARY HYPERPARATHYROIDISM 12/25/2009  . KNEE PAIN, LEFT 12/25/2009  . PLANTAR FASCIITIS 12/16/2009  . ACUTE PROSTATITIS 01/14/2009  . ANEMIA-IRON DEFICIENCY 01/09/2008  . ACUTE SINUSITIS, UNSPECIFIED 07/31/2007  . Dyslipidemia 12/16/2006  . ANXIETY 12/16/2006  . ERECTILE DYSFUNCTION 12/16/2006  . Essential hypertension 12/16/2006  . ALLERGIC RHINITIS 12/16/2006  . ASTHMA 12/16/2006  . GERD 12/16/2006  . COLONIC POLYPS, HX OF 12/16/2006  . BENIGN PROSTATIC HYPERTROPHY, HX OF 12/16/2006  . PARONYCHIA, FINGER 10/11/2006    Current Outpatient Medications on File Prior to Visit  Medication Sig Dispense Refill  . alendronate (FOSAMAX) 70 MG tablet TAKE 1 TABLET BY MOUTH EVERY 7 DAYS. TAKE WITH FULL GLASS OF WATER ON AN EMPTY STOMACH 12 tablet 3  . amLODipine (NORVASC) 10 MG tablet TAKE 1 TABLET (10 MG TOTAL) BY MOUTH DAILY. 90 tablet 3  . aspirin 81 MG tablet Take 81 mg by mouth daily as needed for pain (as needed).     . benazepril (LOTENSIN) 10 MG tablet Take 1 tablet (10 mg total) by mouth daily. 90 tablet 3  . cetirizine (ZYRTEC) 10 MG tablet Take 10 mg by mouth daily.     . ferrous sulfate 325 (65 FE) MG tablet TAKE 1 TABLET BY MOUTH 2 TIMES DAILY WITH A MEAL. 60 tablet 11  . fish oil-omega-3 fatty acids 1000 MG capsule Take 2 g by mouth daily.     . metoprolol tartrate (LOPRESSOR) 50 MG tablet Take 1 tablet (50 mg total) by mouth 2 (two) times daily. 180  tablet 3  . montelukast (SINGULAIR) 10 MG tablet Take 1 tablet (10 mg total) by mouth every evening. 90 tablet 3  . Multiple Vitamin (MULTIVITAMIN) tablet Take 1 tablet by mouth daily.     Marland Kitchen omeprazole (PRILOSEC) 20 MG capsule Take 1 capsule (20 mg total) by mouth daily. 90 capsule 3  . RAPAFLO 8 MG CAPS capsule TAKE 1 CAPSULE (8 MG TOTAL) BY MOUTH DAILY WITH BREAKFAST. (Patient not taking: Reported on 03/02/2017) 30 capsule 1  . simvastatin (ZOCOR) 20 MG tablet TAKE 1 TABLET (20 MG TOTAL) BY MOUTH DAILY. 90 tablet 3  . tadalafil (CIALIS) 5 MG tablet Once daily for BPH 90 tablet 3  . tamsulosin (FLOMAX) 0.4 MG CAPS capsule TAKE 1 CAPSULE (0.4 MG TOTAL) BY MOUTH DAILY. (Patient not taking: Reported on 03/02/2017) 90 capsule 3  . [DISCONTINUED] amLODipine-benazepril (LOTREL) 10-20 MG per capsule Take 1 capsule by mouth daily. 30 capsule 11   No current facility-administered medications on file prior to visit.    No Known Allergies   Objective: Physical Exam  General: Well developed, nourished, no acute distress, awake, alert and oriented x 3  Vascular: Dorsalis pedis artery 2/4 bilateral, Posterior tibial artery 2/4 bilateral, skin temperature warm to warm proximal to distal bilateral lower extremities, no varicosities, pedal hair present bilateral.  Neurological: Epicritic sensation intact via 5.07 Semmes Weinstein at all pedal sites, vibratory sensation intact bilateral.  Dermatological: Skin is warm, dry, and supple bilateral, Nails 1-10 are tender, long, thick, and discolored with mild subungal debris, no webspace macerations present bilateral, no open lesions present bilateral, Hyperkeratotic tissue present sub 1st & 5th metatarsal heads on right and sub 3rd metatarsal on left with no signs of infection bilateral.  Musculoskeletal: Asymptomatic bunion and hammertes bilateral. Muscular strength within normal limits without pain or limitation on range of motion. No pain with calf compression  bilateral.  Assessment and Plan:  Problem List Items Addressed This Visit    None    Visit Diagnoses    Dermatophytosis of nail    -  Primary   Corns and callosities       Pain of toe, unspecified laterality         -Examination performed. -Discussed treatment options for painful mycotic nails & callus skin. -Mechanically debrided and reduced mycotic nails with sterile nail nipper and dremel nail file without incident. -Debrided callouses x 3 using sterile chisel blade without incident. -Recommend cont with skin emollient for dry calloused skin, [facility] feet as previously recommended  -Recommend tea tree oil to nails as previous  -Recommend cont with good supportive shoes daily -Patient to return in 62 days for follow up evaluation or sooner if symptoms worsen.  Landis Martins, DPM

## 2017-03-25 DIAGNOSIS — J3089 Other allergic rhinitis: Secondary | ICD-10-CM | POA: Diagnosis not present

## 2017-03-25 DIAGNOSIS — J301 Allergic rhinitis due to pollen: Secondary | ICD-10-CM | POA: Diagnosis not present

## 2017-03-29 DIAGNOSIS — J3089 Other allergic rhinitis: Secondary | ICD-10-CM | POA: Diagnosis not present

## 2017-03-29 DIAGNOSIS — R05 Cough: Secondary | ICD-10-CM | POA: Diagnosis not present

## 2017-03-29 DIAGNOSIS — H1045 Other chronic allergic conjunctivitis: Secondary | ICD-10-CM | POA: Diagnosis not present

## 2017-03-29 DIAGNOSIS — J301 Allergic rhinitis due to pollen: Secondary | ICD-10-CM | POA: Diagnosis not present

## 2017-03-31 DIAGNOSIS — G4733 Obstructive sleep apnea (adult) (pediatric): Secondary | ICD-10-CM | POA: Diagnosis not present

## 2017-04-05 DIAGNOSIS — K648 Other hemorrhoids: Secondary | ICD-10-CM | POA: Diagnosis not present

## 2017-04-05 DIAGNOSIS — Z1211 Encounter for screening for malignant neoplasm of colon: Secondary | ICD-10-CM | POA: Diagnosis not present

## 2017-04-05 DIAGNOSIS — K5730 Diverticulosis of large intestine without perforation or abscess without bleeding: Secondary | ICD-10-CM | POA: Diagnosis not present

## 2017-04-05 DIAGNOSIS — K635 Polyp of colon: Secondary | ICD-10-CM | POA: Diagnosis not present

## 2017-04-05 DIAGNOSIS — D123 Benign neoplasm of transverse colon: Secondary | ICD-10-CM | POA: Diagnosis not present

## 2017-04-05 DIAGNOSIS — Z86010 Personal history of colonic polyps: Secondary | ICD-10-CM | POA: Diagnosis not present

## 2017-04-06 DIAGNOSIS — D123 Benign neoplasm of transverse colon: Secondary | ICD-10-CM | POA: Diagnosis not present

## 2017-04-07 DIAGNOSIS — J3089 Other allergic rhinitis: Secondary | ICD-10-CM | POA: Diagnosis not present

## 2017-04-07 DIAGNOSIS — J301 Allergic rhinitis due to pollen: Secondary | ICD-10-CM | POA: Diagnosis not present

## 2017-04-12 DIAGNOSIS — J301 Allergic rhinitis due to pollen: Secondary | ICD-10-CM | POA: Diagnosis not present

## 2017-04-12 DIAGNOSIS — J3089 Other allergic rhinitis: Secondary | ICD-10-CM | POA: Diagnosis not present

## 2017-04-21 DIAGNOSIS — J301 Allergic rhinitis due to pollen: Secondary | ICD-10-CM | POA: Diagnosis not present

## 2017-04-21 DIAGNOSIS — J3089 Other allergic rhinitis: Secondary | ICD-10-CM | POA: Diagnosis not present

## 2017-05-04 DIAGNOSIS — K648 Other hemorrhoids: Secondary | ICD-10-CM | POA: Diagnosis not present

## 2017-05-10 DIAGNOSIS — J3089 Other allergic rhinitis: Secondary | ICD-10-CM | POA: Diagnosis not present

## 2017-05-10 DIAGNOSIS — J301 Allergic rhinitis due to pollen: Secondary | ICD-10-CM | POA: Diagnosis not present

## 2017-05-18 DIAGNOSIS — K648 Other hemorrhoids: Secondary | ICD-10-CM | POA: Diagnosis not present

## 2017-05-24 ENCOUNTER — Ambulatory Visit: Payer: Medicare Other | Admitting: Sports Medicine

## 2017-05-24 ENCOUNTER — Encounter: Payer: Self-pay | Admitting: Sports Medicine

## 2017-05-24 DIAGNOSIS — M79676 Pain in unspecified toe(s): Secondary | ICD-10-CM

## 2017-05-24 DIAGNOSIS — B351 Tinea unguium: Secondary | ICD-10-CM

## 2017-05-24 DIAGNOSIS — L84 Corns and callosities: Secondary | ICD-10-CM | POA: Diagnosis not present

## 2017-05-24 NOTE — Progress Notes (Signed)
Patient ID: [name] [name], male   DOB: Jul 22, 1940, 77 y.o.   MRN: [id number] Subjective: [name] [name] is a 77 y.o. male patient seen today in office with complaint of callus and thickened and elongated toenails; unable to trim. Patient denies any changes with medical history since last visit. Patient has no other pedal complaints at this time.   Patient Active Problem List   Diagnosis Date Noted  . Arthritis of knee 12/23/2014  . Primary osteoarthritis of left knee 12/21/2014  . OSA (obstructive sleep apnea) 05/27/2011  . PRIMARY HYPERPARATHYROIDISM 12/25/2009  . KNEE PAIN, LEFT 12/25/2009  . PLANTAR FASCIITIS 12/16/2009  . ACUTE PROSTATITIS 01/14/2009  . ANEMIA-IRON DEFICIENCY 01/09/2008  . ACUTE SINUSITIS, UNSPECIFIED 07/31/2007  . Dyslipidemia 12/16/2006  . ANXIETY 12/16/2006  . ERECTILE DYSFUNCTION 12/16/2006  . Essential hypertension 12/16/2006  . ALLERGIC RHINITIS 12/16/2006  . ASTHMA 12/16/2006  . GERD 12/16/2006  . COLONIC POLYPS, HX OF 12/16/2006  . BENIGN PROSTATIC HYPERTROPHY, HX OF 12/16/2006  . PARONYCHIA, FINGER 10/11/2006    Current Outpatient Medications on File Prior to Visit  Medication Sig Dispense Refill  . alendronate (FOSAMAX) 70 MG tablet TAKE 1 TABLET BY MOUTH EVERY 7 DAYS. TAKE WITH FULL GLASS OF WATER ON AN EMPTY STOMACH 12 tablet 3  . amLODipine (NORVASC) 10 MG tablet TAKE 1 TABLET (10 MG TOTAL) BY MOUTH DAILY. 90 tablet 3  . aspirin 81 MG tablet Take 81 mg by mouth daily as needed for pain (as needed).     . benazepril (LOTENSIN) 10 MG tablet Take 1 tablet (10 mg total) by mouth daily. 90 tablet 3  . cetirizine (ZYRTEC) 10 MG tablet Take 10 mg by mouth daily.     . ferrous sulfate 325 (65 FE) MG tablet TAKE 1 TABLET BY MOUTH 2 TIMES DAILY WITH A MEAL. 60 tablet 11  . fish oil-omega-3 fatty acids 1000 MG capsule Take 2 g by mouth daily.     . metoprolol tartrate (LOPRESSOR) 50 MG tablet Take 1 tablet (50 mg total) by mouth 2 (two) times daily. 180  tablet 3  . montelukast (SINGULAIR) 10 MG tablet Take 1 tablet (10 mg total) by mouth every evening. 90 tablet 3  . Multiple Vitamin (MULTIVITAMIN) tablet Take 1 tablet by mouth daily.     Marland Kitchen omeprazole (PRILOSEC) 20 MG capsule Take 1 capsule (20 mg total) by mouth daily. 90 capsule 3  . RAPAFLO 8 MG CAPS capsule TAKE 1 CAPSULE (8 MG TOTAL) BY MOUTH DAILY WITH BREAKFAST. 30 capsule 1  . simvastatin (ZOCOR) 20 MG tablet TAKE 1 TABLET (20 MG TOTAL) BY MOUTH DAILY. 90 tablet 3  . tadalafil (CIALIS) 5 MG tablet Once daily for BPH 90 tablet 3  . tamsulosin (FLOMAX) 0.4 MG CAPS capsule TAKE 1 CAPSULE (0.4 MG TOTAL) BY MOUTH DAILY. 90 capsule 3  . [DISCONTINUED] amLODipine-benazepril (LOTREL) 10-20 MG per capsule Take 1 capsule by mouth daily. 30 capsule 11   No current facility-administered medications on file prior to visit.    No Known Allergies   Objective: Physical Exam  General: Well developed, nourished, no acute distress, awake, alert and oriented x 3  Vascular: Dorsalis pedis artery 2/4 bilateral, Posterior tibial artery 2/4 bilateral, skin temperature warm to warm proximal to distal bilateral lower extremities, no varicosities, pedal hair present bilateral.  Neurological: Epicritic sensation intact via 5.07 Semmes Weinstein at all pedal sites, vibratory sensation intact bilateral.  Dermatological: Skin is warm, dry, and supple bilateral, Nails 1-10 are  tender, long, thick, and discolored with mild subungal debris, no webspace macerations present bilateral, no open lesions present bilateral, Hyperkeratotic tissue present sub 1st & 5th metatarsal heads on right and sub 3rd metatarsal on left with no signs of infection bilateral.  Musculoskeletal: Asymptomatic bunion and hammertes bilateral. Muscular strength within normal limits without pain or limitation on range of motion. No pain with calf compression bilateral.  Assessment and Plan:  Problem List Items Addressed This Visit    None     Visit Diagnoses    Dermatophytosis of nail    -  Primary   Corns and callosities       Pain of toe, unspecified laterality         -Examination performed. -Discussed treatment options for painful mycotic nails & callus skin. -Mechanically debrided and reduced mycotic nails with sterile nail nipper and dremel nail file without incident. -Debrided callouses x 3 using sterile chisel blade without incident. -Recommend cont with good hygiene habits -Recommend cont with good supportive shoes daily -Patient to return in 62 days for follow up evaluation or sooner if symptoms worsen.  Landis Martins, DPM

## 2017-05-31 DIAGNOSIS — J3089 Other allergic rhinitis: Secondary | ICD-10-CM | POA: Diagnosis not present

## 2017-05-31 DIAGNOSIS — J301 Allergic rhinitis due to pollen: Secondary | ICD-10-CM | POA: Diagnosis not present

## 2017-06-07 DIAGNOSIS — Z96652 Presence of left artificial knee joint: Secondary | ICD-10-CM | POA: Diagnosis not present

## 2017-06-07 DIAGNOSIS — Z09 Encounter for follow-up examination after completed treatment for conditions other than malignant neoplasm: Secondary | ICD-10-CM | POA: Diagnosis not present

## 2017-06-07 DIAGNOSIS — M1711 Unilateral primary osteoarthritis, right knee: Secondary | ICD-10-CM | POA: Diagnosis not present

## 2017-06-08 DIAGNOSIS — K648 Other hemorrhoids: Secondary | ICD-10-CM | POA: Diagnosis not present

## 2017-06-16 DIAGNOSIS — J301 Allergic rhinitis due to pollen: Secondary | ICD-10-CM | POA: Diagnosis not present

## 2017-06-16 DIAGNOSIS — J3089 Other allergic rhinitis: Secondary | ICD-10-CM | POA: Diagnosis not present

## 2017-06-23 DIAGNOSIS — J301 Allergic rhinitis due to pollen: Secondary | ICD-10-CM | POA: Diagnosis not present

## 2017-06-23 DIAGNOSIS — J3089 Other allergic rhinitis: Secondary | ICD-10-CM | POA: Diagnosis not present

## 2017-06-30 DIAGNOSIS — J301 Allergic rhinitis due to pollen: Secondary | ICD-10-CM | POA: Diagnosis not present

## 2017-06-30 DIAGNOSIS — J3089 Other allergic rhinitis: Secondary | ICD-10-CM | POA: Diagnosis not present

## 2017-06-30 NOTE — Telephone Encounter (Signed)
This encounter was created in error - please disregard.

## 2017-07-15 DIAGNOSIS — J301 Allergic rhinitis due to pollen: Secondary | ICD-10-CM | POA: Diagnosis not present

## 2017-07-15 DIAGNOSIS — J3089 Other allergic rhinitis: Secondary | ICD-10-CM | POA: Diagnosis not present

## 2017-07-26 ENCOUNTER — Ambulatory Visit: Payer: Medicare Other | Admitting: Sports Medicine

## 2017-07-28 DIAGNOSIS — J3089 Other allergic rhinitis: Secondary | ICD-10-CM | POA: Diagnosis not present

## 2017-07-28 DIAGNOSIS — J301 Allergic rhinitis due to pollen: Secondary | ICD-10-CM | POA: Diagnosis not present

## 2017-08-01 NOTE — Progress Notes (Signed)
Subjective:   [name] [name] is a 77 y.o. male who presents for Medicare Annual/Subsequent preventive examination. NO SHOW    Hyperparathyroidism - taking fosamax Last ov 03/02/2017   Works in Engineer, civil (consulting) and follows counselor at the [employer] w/ wife in 3 story home. Does have some trouble getting up and down stairs due to arthritis in his knees but is not considering moving at this time. bedroom on main floor, bathroom on main floor, equipment: Cane  Diet Wt Readings from Last 3 Encounters:  03/02/17 236 lb 3.2 oz (107.1 kg)  08/23/16 248 lb (112.5 kg)  07/29/16 247 lb 8 oz (112.3 kg)   Losing weight  2018:meal preparation, eat out, water intake, caffeinated beverages, dairy products, fruits and vegetables): well balanced, on average, 3 meals per day. 'I try to eat a balanced diet.' Tends to snack on ice cream or cookies before bed. Drinks close to 8 glasses of water daily. Breakfast: cereal or oatmeal w/ banana; infrequently has bacon and eggs Lunch: sub sandwich and chips  Dinner: varies-wife cooks most meals  Lipids chol/hdl 2;  hdl 68 A1c 6.2   PSA 02/2017  OSA with cpap   Exercise Goal in 2018 was to increase his physical activity   He is on fosamax  Discuss shingrix again    Health Maintenance Due  Topic Date Due  . TETANUS/TDAP  Aug 26, 201961   Eye Exam- Follows w/ Dr. Claudean Kinds for routine eye exams and Dr. 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 Junious Silk  Bone density 02/2013 normal     Objective:     BP Readings from Last 3 Encounters:  03/02/17 130/72  08/23/16 114/66  07/29/16 112/60      Vitals: There were no vitals taken for this visit.  There is no height or weight on file to calculate BMI.  Advanced Directives 07/29/2016 12/23/2014 12/13/2014  Does Patient Have a Medical Advance Directive? Yes Yes Yes  Type of Engineer, materials of [place];Living [name] Living [name];Healthcare Power of [place] in Chart? - No - copy requested No - copy requested    Tobacco Social History   Tobacco Use  Smoking Status Former Smoker  . Packs/day: 1.00  . Years: 27.00  . Pack years: 27.00  . Types: Cigarettes  . Last attempt to quit: 02/15/1986  . Years since quitting: 31.4  Smokeless Tobacco Former Systems developer  . Quit date: 08/06/1979     Counseling given: Not Answered   Clinical Intake:       Past Medical History:  Diagnosis Date  . Allergy    sees Dr. Donneta Romberg   . Anemia   . Anxiety   . Arthritis   . Asthma   . Colon polyp   . Depression    sees Dr. Kimberlee Nearing in [place], [place]   . GERD (gastroesophageal reflux disease)   . Hyperlipidemia   . Hypertension   . Osteoporosis 01-2010  . RBBB (right bundle branch block)    stable  . Sleep apnea    cpap   Past Surgical History:  Procedure Laterality Date  . COLONOSCOPY  03-14-14   per Dr. Earlean Shawl, polyps, repeat in 3 yrs  . ELBOW SURGERY     right  . 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 Cancer Mother        stomach   Social History   Socioeconomic History  . Marital status: Married    Spouse name: Not on file  . Number of children: y  . Years of education: Not on file  . Highest education level: Not on file  Occupational History  . Occupation: Public relations account executive: RETIRED  Social Needs  . Financial resource strain: Not on file  . Food insecurity:    Worry: Not on file    Inability: Not on file  . Transportation needs:    Medical: Not on file    Non-medical: Not on file  Tobacco Use  . Smoking status: Former Smoker    Packs/day: 1.00    Years: 27.00    Pack years: 27.00    Types: Cigarettes    Last  attempt to quit: 02/15/1986    Years since quitting: 31.4  . Smokeless tobacco: Former Systems developer    Quit date: 08/06/1979  Substance and Sexual Activity  . Alcohol use: Yes    Alcohol/week: 0.0 - 1.8 oz    Comment: Occ  . Drug use: No  . Sexual activity: Yes  Lifestyle  . Physical activity:    Days per week: Not on file    Minutes per session: Not on file  . Stress: Not on file  Relationships  . Social connections:    Talks on phone: Not on file    Gets together: Not on file    Attends religious service: Not on file    Active member of club or organization: Not on file    Attends meetings of clubs or organizations: Not on file    Relationship status: Not on file  Other Topics Concern  . Not on file  Social History Narrative  . Not on file    Outpatient Encounter Medications as of 08/02/2017  Medication Sig  . alendronate (FOSAMAX) 70 MG tablet TAKE 1 TABLET BY MOUTH EVERY 7 DAYS. TAKE WITH FULL GLASS OF WATER ON AN EMPTY STOMACH  . amLODipine (NORVASC) 10 MG tablet TAKE 1 TABLET (10 MG TOTAL) BY MOUTH DAILY.  Marland Kitchen aspirin 81 MG tablet Take 81 mg by mouth daily as needed for pain (as needed).   . benazepril (LOTENSIN) 10 MG tablet Take 1 tablet (10 mg total) by mouth daily.  . cetirizine (ZYRTEC) 10 MG tablet Take 10 mg by mouth daily.   . ferrous sulfate 325 (65 FE) MG tablet TAKE 1 TABLET BY MOUTH 2 TIMES DAILY WITH A MEAL.  . fish oil-omega-3 fatty acids 1000 MG capsule Take 2 g by mouth daily.   . metoprolol tartrate (LOPRESSOR) 50 MG tablet Take 1 tablet (50 mg total) by mouth 2 (two) times daily.  . montelukast (SINGULAIR) 10 MG tablet Take 1 tablet (10 mg total) by mouth every evening.  . Multiple Vitamin (MULTIVITAMIN) tablet Take 1 tablet by mouth daily.   Marland Kitchen omeprazole (PRILOSEC) 20 MG capsule Take 1 capsule (20 mg total) by mouth daily.  Marland Kitchen RAPAFLO 8 MG CAPS capsule TAKE 1 CAPSULE (8 MG TOTAL) BY MOUTH DAILY WITH BREAKFAST.  Marland Kitchen simvastatin (ZOCOR) 20 MG tablet TAKE 1 TABLET (20  MG TOTAL) BY MOUTH DAILY.  . tadalafil (CIALIS) 5 MG tablet Once daily for BPH  . tamsulosin (FLOMAX)  0.4 MG CAPS capsule TAKE 1 CAPSULE (0.4 MG TOTAL) BY MOUTH DAILY.  . [DISCONTINUED] amLODipine-benazepril (LOTREL) 10-20 MG per capsule Take 1 capsule by mouth daily.   No facility-administered encounter medications on file as of 08/02/2017.     Activities of Daily Living No flowsheet data found.  Patient Care Team: Laurey Morale, MD as PCP - Lowry Bowl, MD as Consulting Physician (Orthopedic Surgery) Ralene Bathe, MD as Consulting Physician (Ophthalmology) Mosetta Anis, MD as Consulting Physician (Allergy) Festus Aloe, MD as Consulting Physician (Urology) Hayden Pedro, MD as Consulting Physician (Ophthalmology)   Assessment:   This is a routine wellness examination for [name] [name].  Exercise Activities and Dietary recommendations    Goals    None      Fall Risk Fall Risk  07/29/2016 02/24/2016 02/19/2015 01/29/2014  Falls in the past year? Yes Yes Yes Yes  Number falls in past yr: 2 or more 1 1 1   Injury with Fall? Yes No No Yes  Risk for fall due to : Impaired balance/gait - - -     Depression Screen PHQ 2/9 Scores 07/29/2016 02/24/2016 02/19/2015 01/29/2014  PHQ - 2 Score 0 1 0 0    Cognitive Function MMSE - Mini Mental State Exam 07/29/2016  Orientation to time 5  Orientation to Place 5  Registration 3  Attention/ Calculation 5  Recall 3  Language- name 2 objects 2  Language- repeat 1  Language- follow 3 step command 3  Language- read & follow direction 1  Write a sentence 1  Copy design 1  Total score 30        Immunization History  Administered Date(s) Administered  . Influenza Split 01/07/2012, 12/11/2012, 12/02/2014  . Influenza Whole 12/16/2006, 11/16/2010  . Influenza-Unspecified 12/16/2013, 12/05/2015, 11/27/2016  . Pneumococcal Conjugate-13 02/19/2015  . Pneumococcal Polysaccharide-23 02/16/2008      Screening  Tests Health Maintenance  Topic Date Due  . TETANUS/TDAP  2019-12-1359  . INFLUENZA VACCINE  09/15/2017  . COLONOSCOPY  04/05/2020  . PNA vac Low Risk Adult  Completed         Plan:         I have personally reviewed and noted the following in the patient's chart:   . Medical and social history . Use of alcohol, tobacco or illicit drugs  . Current medications and supplements . Functional ability and status . Nutritional status . Physical activity . Advanced directives . List of other physicians . Hospitalizations, surgeries, and ER visits in previous 12 months . Vitals . Screenings to include cognitive, depression, and falls . Referrals and appointments  In addition, I have reviewed and discussed with patient certain preventive protocols, quality metrics, and best practice recommendations. A written personalized care plan for preventive services as well as general preventive health recommendations were provided to patient.     Wynetta Fines, RN  08/03/2017

## 2017-08-02 NOTE — Patient Instructions (Signed)
  [name] [name] , Thank you for taking time to come for your Medicare Wellness Visit. I appreciate your ongoing commitment to your health goals. Please review the following plan we discussed and let me know if I can assist you in the future.   These are the goals we discussed: Goals    None      This is a list of the screening recommended for you and due dates:  Health Maintenance  Topic Date Due  . Tetanus Vaccine  January 20, 201961  . Flu Shot  09/15/2017  . Colon Cancer Screening  04/05/2020  . Pneumonia vaccines  Completed

## 2017-08-04 DIAGNOSIS — J3089 Other allergic rhinitis: Secondary | ICD-10-CM | POA: Diagnosis not present

## 2017-08-04 DIAGNOSIS — J301 Allergic rhinitis due to pollen: Secondary | ICD-10-CM | POA: Diagnosis not present

## 2017-08-12 DIAGNOSIS — J301 Allergic rhinitis due to pollen: Secondary | ICD-10-CM | POA: Diagnosis not present

## 2017-08-12 DIAGNOSIS — J3089 Other allergic rhinitis: Secondary | ICD-10-CM | POA: Diagnosis not present

## 2017-08-16 ENCOUNTER — Ambulatory Visit: Payer: Medicare Other | Admitting: Sports Medicine

## 2017-08-16 DIAGNOSIS — J301 Allergic rhinitis due to pollen: Secondary | ICD-10-CM | POA: Diagnosis not present

## 2017-08-16 DIAGNOSIS — J3089 Other allergic rhinitis: Secondary | ICD-10-CM | POA: Diagnosis not present

## 2017-08-23 ENCOUNTER — Encounter: Payer: Self-pay | Admitting: Sports Medicine

## 2017-08-23 ENCOUNTER — Ambulatory Visit: Payer: Medicare Other | Admitting: Sports Medicine

## 2017-08-23 DIAGNOSIS — M79676 Pain in unspecified toe(s): Secondary | ICD-10-CM | POA: Diagnosis not present

## 2017-08-23 DIAGNOSIS — L84 Corns and callosities: Secondary | ICD-10-CM

## 2017-08-23 DIAGNOSIS — B351 Tinea unguium: Secondary | ICD-10-CM

## 2017-08-23 DIAGNOSIS — J3089 Other allergic rhinitis: Secondary | ICD-10-CM | POA: Diagnosis not present

## 2017-08-23 DIAGNOSIS — J301 Allergic rhinitis due to pollen: Secondary | ICD-10-CM | POA: Diagnosis not present

## 2017-08-23 NOTE — Progress Notes (Signed)
Patient ID: Lavoy Bernards Timpone, male   DOB: Jul 22, 1940, 77 y.o.   MRN: [id number] Subjective: Sohil Timko Mcentee is a 77 y.o. male patient seen today in office with complaint of callus and thickened and elongated toenails; unable to trim. Patient denies any changes with medical history since last visit. Patient has no other pedal complaints at this time.   Patient Active Problem List   Diagnosis Date Noted  . Arthritis of knee 12/23/2014  . Primary osteoarthritis of left knee 12/21/2014  . OSA (obstructive sleep apnea) 05/27/2011  . PRIMARY HYPERPARATHYROIDISM 12/25/2009  . KNEE PAIN, LEFT 12/25/2009  . PLANTAR FASCIITIS 12/16/2009  . ACUTE PROSTATITIS 01/14/2009  . ANEMIA-IRON DEFICIENCY 01/09/2008  . ACUTE SINUSITIS, UNSPECIFIED 07/31/2007  . Dyslipidemia 12/16/2006  . ANXIETY 12/16/2006  . ERECTILE DYSFUNCTION 12/16/2006  . Essential hypertension 12/16/2006  . ALLERGIC RHINITIS 12/16/2006  . ASTHMA 12/16/2006  . GERD 12/16/2006  . COLONIC POLYPS, HX OF 12/16/2006  . BENIGN PROSTATIC HYPERTROPHY, HX OF 12/16/2006  . PARONYCHIA, FINGER 10/11/2006    Current Outpatient Medications on File Prior to Visit  Medication Sig Dispense Refill  . alendronate (FOSAMAX) 70 MG tablet TAKE 1 TABLET BY MOUTH EVERY 7 DAYS. TAKE WITH FULL GLASS OF WATER ON AN EMPTY STOMACH 12 tablet 3  . amLODipine (NORVASC) 10 MG tablet TAKE 1 TABLET (10 MG TOTAL) BY MOUTH DAILY. 90 tablet 3  . aspirin 81 MG tablet Take 81 mg by mouth daily as needed for pain (as needed).     . benazepril (LOTENSIN) 10 MG tablet Take 1 tablet (10 mg total) by mouth daily. 90 tablet 3  . cetirizine (ZYRTEC) 10 MG tablet Take 10 mg by mouth daily.     . ferrous sulfate 325 (65 FE) MG tablet TAKE 1 TABLET BY MOUTH 2 TIMES DAILY WITH A MEAL. 60 tablet 11  . fish oil-omega-3 fatty acids 1000 MG capsule Take 2 g by mouth daily.     . metoprolol tartrate (LOPRESSOR) 50 MG tablet Take 1 tablet (50 mg total) by mouth 2 (two) times daily. 180  tablet 3  . montelukast (SINGULAIR) 10 MG tablet Take 1 tablet (10 mg total) by mouth every evening. 90 tablet 3  . Multiple Vitamin (MULTIVITAMIN) tablet Take 1 tablet by mouth daily.     Marland Kitchen omeprazole (PRILOSEC) 20 MG capsule Take 1 capsule (20 mg total) by mouth daily. 90 capsule 3  . RAPAFLO 8 MG CAPS capsule TAKE 1 CAPSULE (8 MG TOTAL) BY MOUTH DAILY WITH BREAKFAST. 30 capsule 1  . simvastatin (ZOCOR) 20 MG tablet TAKE 1 TABLET (20 MG TOTAL) BY MOUTH DAILY. 90 tablet 3  . tadalafil (CIALIS) 5 MG tablet Once daily for BPH 90 tablet 3  . tamsulosin (FLOMAX) 0.4 MG CAPS capsule TAKE 1 CAPSULE (0.4 MG TOTAL) BY MOUTH DAILY. 90 capsule 3  . [DISCONTINUED] amLODipine-benazepril (LOTREL) 10-20 MG per capsule Take 1 capsule by mouth daily. 30 capsule 11   No current facility-administered medications on file prior to visit.    No Known Allergies   Objective: Physical Exam  General: Well developed, nourished, no acute distress, awake, alert and oriented x 3  Vascular: Dorsalis pedis artery 2/4 bilateral, Posterior tibial artery 2/4 bilateral, skin temperature warm to warm proximal to distal bilateral lower extremities, no varicosities, pedal hair present bilateral.  Neurological: Epicritic sensation intact via 5.07 Semmes Weinstein at all pedal sites, vibratory sensation intact bilateral.  Dermatological: Skin is warm, dry, and supple bilateral, Nails 1-10 are  tender, long, thick, and discolored with mild subungal debris, no webspace macerations present bilateral, no open lesions present bilateral, Hyperkeratotic tissue present sub 1st & 5th metatarsal heads on right and sub 3rd metatarsal on left with right sub 1 being worse today all others are minimal with no signs of infection bilateral.  Musculoskeletal: Asymptomatic bunion and hammertes bilateral. Muscular strength within normal limits without pain or limitation on range of motion. No pain with calf compression bilateral.  Assessment and  Plan:  Problem List Items Addressed This Visit    None    Visit Diagnoses    Dermatophytosis of nail    -  Primary   Corns and callosities       Pain of toe, unspecified laterality         -Examination performed. -Discussed treatment options for painful mycotic nails & callus skin. -Mechanically debrided and reduced mycotic nails with sterile nail nipper and dremel nail file without incident. -At no charge, debrided callous x 1 worse one at right sub 1 using sterile chisel blade without incident. -Recommend cont with good hygiene habits -Recommend cont with good supportive shoes daily -Patient to return in 62 days for follow up evaluation or sooner if symptoms worsen.  Landis Martins, DPM

## 2017-09-06 NOTE — Progress Notes (Addendum)
Subjective:   [name] [name] is a 77 y.o. male who presents for Medicare Annual (Subsequent) preventive examination.  Reports health as pretty good  Wife at home just had fall Still have a small family owned [place] [address] 2 children  One lives here and one in [place] grandchild lives in [place] 30 Usually eats breakfast  Skip lunch at times Supper - sometimes goes out  Tries to eat 3 times a day    Exercise Works occasionally  They have a neighborhood gym  Uses the exercise bike   Health Maintenance Due  Topic Date Due  . TETANUS/TDAP  20-Mar-201961   Colonoscopy 03/2017 repeat in 5 years  Goes to Urologist         Objective:     Vitals: BP 110/60   Pulse 72   Ht 6' (1.829 m)   Wt 223 lb (101.2 kg)   SpO2 93%   BMI 30.24 kg/m   Body mass index is 30.24 kg/m.  Advanced Directives 09/07/2017 07/29/2016 12/23/2014 12/13/2014  Does Patient Have a Medical Advance Directive? Yes Yes Yes Yes  Type of Advance Directive - [place];Living will Living will;Healthcare Power of [place] in Chart? - - No - copy requested No - copy requested    Tobacco Social History   Tobacco Use  Smoking Status Former Smoker  . Packs/day: 1.00  . Years: 27.00  . Pack years: 27.00  . Types: Cigarettes  . Last attempt to quit: 02/15/1986  . Years since quitting: 31.5  Smokeless Tobacco Former Systems developer  . Quit date: 08/06/1979     Counseling given: Yes   Clinical Intake:   Past Medical History:  Diagnosis Date  . Allergy    sees Dr. Donneta Romberg   . Anemia   . Anxiety   . Arthritis   . Asthma   . Colon polyp   . Depression    sees Dr. Kimberlee Nearing in [place], [place]   . GERD (gastroesophageal reflux disease)   . Hyperlipidemia   . Hypertension   . Osteoporosis 01-2010  . RBBB (right bundle branch block)    stable  . Sleep apnea    cpap   Past Surgical History:  Procedure Laterality Date  .  COLONOSCOPY  03-14-14   per Dr. Earlean Shawl, polyps, repeat in 3 yrs  . ELBOW SURGERY     right  . 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 Cancer Mother        stomach   Social History   Socioeconomic History  . Marital status: Married    Spouse name: Not on file  . Number of children: y  . Years of education: Not on file  . Highest education level: Not on file  Occupational History  . Occupation: Public relations account executive: RETIRED  Social Needs  . Financial resource strain: Not on file  . Food insecurity:    Worry: Not on file    Inability: Not on file  . Transportation needs:    Medical: Not on file    Non-medical: Not on file  Tobacco Use  . Smoking status: Former Smoker    Packs/day: 1.00    Years: 27.00    Pack years:  27.00    Types: Cigarettes    Last attempt to quit: 02/15/1986    Years since quitting: 31.5  . Smokeless tobacco: Former Systems developer    Quit date: 08/06/1979  Substance and Sexual Activity  . Alcohol use: Yes    Alcohol/week: 0.0 - 1.8 oz    Comment: Occ  . Drug use: No  . Sexual activity: Yes  Lifestyle  . Physical activity:    Days per week: Not on file    Minutes per session: Not on file  . Stress: Not on file  Relationships  . Social connections:    Talks on phone: Not on file    Gets together: Not on file    Attends religious service: Not on file    Active member of club or organization: Not on file    Attends meetings of clubs or organizations: Not on file    Relationship status: Not on file  Other Topics Concern  . Not on file  Social History Narrative  . Not on file    Outpatient Encounter Medications as of 09/07/2017  Medication Sig  . alendronate (FOSAMAX) 70 MG tablet TAKE 1 TABLET BY MOUTH EVERY 7 DAYS. TAKE WITH FULL GLASS OF WATER ON AN EMPTY STOMACH  .  amLODipine (NORVASC) 10 MG tablet TAKE 1 TABLET (10 MG TOTAL) BY MOUTH DAILY.  Marland Kitchen aspirin 81 MG tablet Take 81 mg by mouth daily as needed for pain (as needed).   . benazepril (LOTENSIN) 10 MG tablet Take 1 tablet (10 mg total) by mouth daily.  . cetirizine (ZYRTEC) 10 MG tablet Take 10 mg by mouth daily.   . ferrous sulfate 325 (65 FE) MG tablet TAKE 1 TABLET BY MOUTH 2 TIMES DAILY WITH A MEAL.  . metoprolol tartrate (LOPRESSOR) 50 MG tablet Take 1 tablet (50 mg total) by mouth 2 (two) times daily.  . montelukast (SINGULAIR) 10 MG tablet Take 1 tablet (10 mg total) by mouth every evening.  . Multiple Vitamin (MULTIVITAMIN) tablet Take 1 tablet by mouth daily.   Marland Kitchen omeprazole (PRILOSEC) 20 MG capsule Take 1 capsule (20 mg total) by mouth daily.  Marland Kitchen RAPAFLO 8 MG CAPS capsule TAKE 1 CAPSULE (8 MG TOTAL) BY MOUTH DAILY WITH BREAKFAST.  Marland Kitchen simvastatin (ZOCOR) 20 MG tablet TAKE 1 TABLET (20 MG TOTAL) BY MOUTH DAILY.  . tadalafil (CIALIS) 5 MG tablet Once daily for BPH  . tamsulosin (FLOMAX) 0.4 MG CAPS capsule TAKE 1 CAPSULE (0.4 MG TOTAL) BY MOUTH DAILY.  . fish oil-omega-3 fatty acids 1000 MG capsule Take 2 g by mouth daily.   . [DISCONTINUED] amLODipine-benazepril (LOTREL) 10-20 MG per capsule Take 1 capsule by mouth daily.   No facility-administered encounter medications on file as of 09/07/2017.     Activities of Daily Living In your present state of health, do you have any difficulty performing the following activities: 09/07/2017  Hearing? N  Vision? N  Some recent data might be hidden    Patient Care Team: Laurey Morale, MD as PCP - General Frederik Pear, MD as Consulting Physician (Orthopedic Surgery) Ralene Bathe, MD as Consulting Physician (Ophthalmology) Mosetta Anis, MD as Consulting Physician (Allergy) Festus Aloe, MD as Consulting Physician (Urology) Hayden Pedro, MD as Consulting Physician (Ophthalmology)    Assessment:   This is a routine wellness examination  for [name] [name].  Exercise Activities and Dietary recommendations    Goals    . Patient Stated     Would travel more next year Maybe  take a cruise;        Fall Risk Fall Risk  09/07/2017 07/29/2016 02/24/2016 02/19/2015 01/29/2014  Falls in the past year? No Yes Yes Yes Yes  Comment stumbled sometimes mainly going up and down stairs - - - -  Number falls in past yr: - 2 or more 1 1 1   Injury with Fall? - Yes No No Yes  Risk for fall due to : - Impaired balance/gait - - -     Depression Screen PHQ 2/9 Scores 07/29/2016 02/24/2016 02/19/2015 01/29/2014  PHQ - 2 Score 0 1 0 0     Cognitive Function MMSE - Mini Mental State Exam 07/29/2016  Orientation to time 5  Orientation to Place 5  Registration 3  Attention/ Calculation 5  Recall 3  Language- name 2 objects 2  Language- repeat 1  Language- follow 3 step command 3  Language- read & follow direction 1  Write a sentence 1  Copy design 1  Total score 30   Ad8 score reviewed for issues:  Issues making decisions:  Less interest in hobbies / activities:  Repeats questions, stories (family complaining):  Trouble using ordinary gadgets (microwave, computer, phone):  Forgets the month or year:   Mismanaging finances:   Remembering appts:  Daily problems with thinking and/or memory: Ad8 score is=0          Immunization History  Administered Date(s) Administered  . Influenza Split 01/07/2012, 12/11/2012, 12/02/2014  . Influenza Whole 12/16/2006, 11/16/2010  . Influenza-Unspecified 12/16/2013, 12/05/2015, 11/27/2016  . Pneumococcal Conjugate-13 02/19/2015  . Pneumococcal Polysaccharide-23 02/16/2008    Screening Tests Health Maintenance  Topic Date Due  . TETANUS/TDAP  07/26/201961  . INFLUENZA VACCINE  09/15/2017  . COLONOSCOPY  04/05/2020  . PNA vac Low Risk Adult  Completed          Plan:      PCP Notes   Health Maintenance Colonoscopy 03/2017 repeat in 5 years  Goes to [facility]  regarding tetanus (Tdap) and shingrix    Abnormal Screens  no  Referrals  no  Patient concerns; Pt presented "tired" appears drowsy and asked him about fatigue  States in the afternoons, he has an issue staying awake Used to snore more than now Is currently on a  cpap but constantly has throat irritation He has had some daytime drowsiness when still  A little tired than normal Does not note any sleep changes Advised to see Dr. Sarajane Jews and discuss this drowsiness since it appears to be consistent. Agreed to make an apt  Nurse Concerns; As noted  Next PCP apt TBS;   I have personally reviewed and noted the following in the patient's chart:   . Medical and social history . Use of alcohol, tobacco or illicit drugs  . Current medications and supplements . Functional ability and status . Nutritional status . Physical activity . Advanced directives . List of other physicians . Hospitalizations, surgeries, and ER visits in previous 12 months . Vitals . Screenings to include cognitive, depression, and falls . Referrals and appointments  In addition, I have reviewed and discussed with patient certain preventive protocols, quality metrics, and best practice recommendations. A written personalized care plan for preventive services as well as general preventive health recommendations were provided to patient.     WUJWJ,XBJYN, RN  09/07/2017  I have reviewed the documentation for the AWV and [place] provided by the health coach and agree with their documentation. I was immediately  available for any questions  Eulas Post MD [facility] at [facility]

## 2017-09-07 ENCOUNTER — Ambulatory Visit (INDEPENDENT_AMBULATORY_CARE_PROVIDER_SITE_OTHER): Payer: Medicare Other

## 2017-09-07 VITALS — BP 110/60 | HR 72 | Ht 72.0 in | Wt 223.0 lb

## 2017-09-07 DIAGNOSIS — Z0000 Encounter for general adult medical examination without abnormal findings: Secondary | ICD-10-CM | POA: Diagnosis not present

## 2017-09-07 NOTE — Patient Instructions (Addendum)
[name] , Thank you for taking time to come for your Medicare Wellness Visit. I appreciate your ongoing commitment to your health goals. Please review the following plan we discussed and let me know if I can assist you in the future.   When you leave, please make an apt with Dr. Sarajane Jews to discuss afternoon drowsiness;  You can also make your AWV for next year   Will have eyes checked if vision is different, if there is vision loss or blurred vision   May try the [facility] product which I think is medicare covered now  A Tetanus is recommended every 10 years.  Medicare covers a tetanus if you have a cut or wound; otherwise, there may be a charge. If you had not had a tetanus with pertusses, known as the Tdap, you can take this anytime.   Shingrix is a vaccine for the prevention of Shingles in Adults 50 and older.  If you are on Medicare, the shingrix is covered under your Part D plan, so you will take both of the vaccines in the series at your pharmacy. Please check with your benefits regarding applicable copays or out of pocket expenses.  The Shingrix is given in 2 vaccines approx 8 weeks apart. You must receive the 2nd dose prior to 6 months from receipt of the first. Please have the pharmacist print out you Immunization  dates for our office records     These are the goals we discussed: Goals    . Patient Stated     Would travel more next year Maybe take a cruise;        This is a list of the screening recommended for you and due dates:  Health Maintenance  Topic Date Due  . Tetanus Vaccine  03-30-1959  . Flu Shot  09/15/2017  . Colon Cancer Screening  04/05/2020  . Pneumonia vaccines  Completed      Fall Prevention in the Home Falls can cause injuries. They can happen to people of all ages. There are many things you can do to make your home safe and to help prevent falls. What can I do on the outside of my home?  Regularly fix the edges of walkways and driveways and  fix any cracks.  Remove anything that might make you trip as you walk through a door, such as a raised step or threshold.  Trim any bushes or trees on the path to your home.  Use bright outdoor lighting.  Clear any walking paths of anything that might make someone trip, such as rocks or tools.  Regularly check to see if handrails are loose or broken. Make sure that both sides of any steps have handrails.  Any raised decks and porches should have guardrails on the edges.  Have any leaves, snow, or ice cleared regularly.  Use sand or salt on walking paths during winter.  Clean up any spills in your garage right away. This includes oil or grease spills. What can I do in the bathroom?  Use night lights.  Install grab bars by the toilet and in the tub and shower. Do not use towel bars as grab bars.  Use non-skid mats or decals in the tub or shower.  If you need to sit down in the shower, use a plastic, non-slip stool.  Keep the floor dry. Clean up any water that spills on the floor as soon as it happens.  Remove soap buildup in the tub or shower regularly.  Attach bath mats securely with double-sided non-slip rug tape.  Do not have throw rugs and other things on the floor that can make you trip. What can I do in the bedroom?  Use night lights.  Make sure that you have a light by your bed that is easy to reach.  Do not use any sheets or blankets that are too big for your bed. They should not hang down onto the floor.  Have a firm chair that has side arms. You can use this for support while you get dressed.  Do not have throw rugs and other things on the floor that can make you trip. What can I do in the kitchen?  Clean up any spills right away.  Avoid walking on wet floors.  Keep items that you use a lot in easy-to-reach places.  If you need to reach something above you, use a strong step stool that has a grab bar.  Keep electrical cords out of the way.  Do not  use floor polish or wax that makes floors slippery. If you must use wax, use non-skid floor wax.  Do not have throw rugs and other things on the floor that can make you trip. What can I do with my stairs?  Do not leave any items on the stairs.  Make sure that there are handrails on both sides of the stairs and use them. Fix handrails that are broken or loose. Make sure that handrails are as long as the stairways.  Check any carpeting to make sure that it is firmly attached to the stairs. Fix any carpet that is loose or worn.  Avoid having throw rugs at the top or bottom of the stairs. If you do have throw rugs, attach them to the floor with carpet tape.  Make sure that you have a light switch at the top of the stairs and the bottom of the stairs. If you do not have them, ask someone to add them for you. What else can I do to help prevent falls?  Wear shoes that: ? Do not have high heels. ? Have rubber bottoms. ? Are comfortable and fit you well. ? Are closed at the toe. Do not wear sandals.  If you use a stepladder: ? Make sure that it is fully opened. Do not climb a closed stepladder. ? Make sure that both sides of the stepladder are locked into place. ? Ask someone to hold it for you, if possible.  Clearly mark and make sure that you can see: ? Any grab bars or handrails. ? First and last steps. ? Where the edge of each step is.  Use tools that help you move around (mobility aids) if they are needed. These include: ? Canes. ? Walkers. ? Scooters. ? Crutches.  Turn on the lights when you go into a dark area. Replace any light bulbs as soon as they burn out.  Set up your furniture so you have a clear path. Avoid moving your furniture around.  If any of your floors are uneven, fix them.  If there are any pets around you, be aware of where they are.  Review your medicines with your doctor. Some medicines can make you feel dizzy. This can increase your chance of  falling. Ask your doctor what other things that you can do to help prevent falls. This information is not intended to replace advice given to you by your health care provider. Make sure you discuss any questions you have  with your health care provider. Document Released: 11/28/2008 Document Revised: 07/10/2015 Document Reviewed: 03/08/2014 Elsevier Interactive Patient Education  2018 [place] Maintenance, Male A healthy lifestyle and preventive care is important for your health and wellness. Ask your health care provider about what schedule of regular examinations is right for you. What should I know about weight and diet? Eat a Healthy Diet  Eat plenty of vegetables, fruits, whole grains, low-fat dairy products, and lean protein.  Do not eat a lot of foods high in solid fats, added sugars, or salt.  Maintain a Healthy Weight Regular exercise can help you achieve or maintain a healthy weight. You should:  Do at least 150 minutes of exercise each week. The exercise should increase your heart rate and make you sweat (moderate-intensity exercise).  Do strength-training exercises at least twice a week.  Watch Your Levels of Cholesterol and Blood Lipids  Have your blood tested for lipids and cholesterol every 5 years starting at 77 years of age. If you are at high risk for heart disease, you should start having your blood tested when you are 77 years old. You may need to have your cholesterol levels checked more often if: ? Your lipid or cholesterol levels are high. ? You are older than 77 years of age. ? You are at high risk for heart disease.  What should I know about cancer screening? Many types of cancers can be detected early and may often be prevented. Lung Cancer  You should be screened every year for lung cancer if: ? You are a current smoker who has smoked for at least 30 years. ? You are a former smoker who has quit within the past 15 years.  Talk to your  health care provider about your screening options, when you should start screening, and how often you should be screened.  Colorectal Cancer  Routine colorectal cancer screening usually begins at 77 years of age and should be repeated every 5-10 years until you are 77 years old. You may need to be screened more often if early forms of precancerous polyps or small growths are found. Your health care provider may recommend screening at an earlier age if you have risk factors for colon cancer.  Your health care provider may recommend using home test kits to check for hidden blood in the stool.  A small camera at the end of a tube can be used to examine your colon (sigmoidoscopy or colonoscopy). This checks for the earliest forms of colorectal cancer.  Prostate and Testicular Cancer  Depending on your age and overall health, your health care provider may do certain tests to screen for prostate and testicular cancer.  Talk to your health care provider about any symptoms or concerns you have about testicular or prostate cancer.  Skin Cancer  Check your skin from head to toe regularly.  Tell your health care provider about any new moles or changes in moles, especially if: ? There is a change in a mole's size, shape, or color. ? You have a mole that is larger than a pencil eraser.  Always use sunscreen. Apply sunscreen liberally and repeat throughout the day.  Protect yourself by wearing long sleeves, pants, a wide-brimmed hat, and sunglasses when outside.  What should I know about heart disease, diabetes, and high blood pressure?  If you are 51-44 years of age, have your blood pressure checked every 3-5 years. If you are 52 years of age or older, have your  blood pressure checked every year. You should have your blood pressure measured twice-once when you are at a hospital or clinic, and once when you are not at a hospital or clinic. Record the average of the two measurements. To check your  blood pressure when you are not at a hospital or clinic, you can use: ? An automated blood pressure machine at a pharmacy. ? A home blood pressure monitor.  Talk to your health care provider about your target blood pressure.  If you are between 36-58 years old, ask your health care provider if you should take aspirin to prevent heart disease.  Have regular diabetes screenings by checking your fasting blood sugar level. ? If you are at a normal weight and have a low risk for diabetes, have this test once every three years after the age of 19. ? If you are overweight and have a high risk for diabetes, consider being tested at a younger age or more often.  A one-time screening for abdominal aortic aneurysm (AAA) by ultrasound is recommended for men aged 55-75 years who are current or former smokers. What should I know about preventing infection? Hepatitis B If you have a higher risk for hepatitis B, you should be screened for this virus. Talk with your health care provider to find out if you are at risk for hepatitis B infection. Hepatitis C Blood testing is recommended for:  Everyone born from [birth year] through 1965.  Anyone with known risk factors for hepatitis C.  Sexually Transmitted Diseases (STDs)  You should be screened each year for STDs including gonorrhea and chlamydia if: ? You are sexually active and are younger than 77 years of age. ? You are older than 77 years of age and your health care provider tells you that you are at risk for this type of infection. ? Your sexual activity has changed since you were last screened and you are at an increased risk for chlamydia or gonorrhea. Ask your health care provider if you are at risk.  Talk with your health care provider about whether you are at high risk of being infected with HIV. Your health care provider may recommend a prescription medicine to help prevent HIV infection.  What else can I do?  Schedule regular health, dental, and  eye exams.  Stay current with your vaccines (immunizations).  Do not use any tobacco products, such as cigarettes, chewing tobacco, and e-cigarettes. If you need help quitting, ask your health care provider.  Limit alcohol intake to no more than 2 drinks per day. One drink equals 12 ounces of beer, 5 ounces of wine, or 1 ounces of hard liquor.  Do not use street drugs.  Do not share needles.  Ask your health care provider for help if you need support or information about quitting drugs.  Tell your health care provider if you often feel depressed.  Tell your health care provider if you have ever been abused or do not feel safe at home. This information is not intended to replace advice given to you by your health care provider. Make sure you discuss any questions you have with your health care provider. Document Released: 07/31/2007 Document Revised: 10/01/2015 Document Reviewed: 11/05/2014 Elsevier Interactive Patient Education  [employer].

## 2017-09-08 DIAGNOSIS — J301 Allergic rhinitis due to pollen: Secondary | ICD-10-CM | POA: Diagnosis not present

## 2017-09-08 DIAGNOSIS — J3089 Other allergic rhinitis: Secondary | ICD-10-CM | POA: Diagnosis not present

## 2017-09-12 ENCOUNTER — Telehealth: Payer: Self-pay | Admitting: Family Medicine

## 2017-09-12 DIAGNOSIS — J301 Allergic rhinitis due to pollen: Secondary | ICD-10-CM | POA: Diagnosis not present

## 2017-09-12 DIAGNOSIS — J3089 Other allergic rhinitis: Secondary | ICD-10-CM | POA: Diagnosis not present

## 2017-09-12 NOTE — Telephone Encounter (Signed)
Copied from [place] [phone]. Topic: Appointment Scheduling - Scheduling Inquiry for Clinic >> Sep 12, 2017  1:27 PM Conception Chancy, NT wrote: Reason for CRM: patient wife is calling and states that they are going on a trip on 09/15/17 and states that the patient has lost his appetite and is losing weight from not feeling well and eating right. He has a appointment on 09/16/17 but she is wanting him to be seen by Dr. Sarajane Jews before then and is requesting a call back from his nurse.

## 2017-09-12 NOTE — Telephone Encounter (Signed)
Called and spoke with pt and asked for the OK to speak with his wife in regards to her phone call about her concerns with husband. Not DPR update since 2017.  [name] [name] did give me the verbal OK to speak with wife.   Dr. Sarajane Jews schedule is full for the rest of the week. Pt wife advised.   Pt is leaving and going out of town on 8/1 and the wife is really worry for him to go he is not eating, slurred speech, she is having to help dress him, falling in and out of sleep while sitting, just not feeling well in gerneral. Wife does want to go out of town with him this bad off.  Wife is wanting to know any way possible could he be seen sooner?

## 2017-09-13 NOTE — Telephone Encounter (Signed)
Make him an OV asap this week, I have lots of openings

## 2017-09-13 NOTE — Telephone Encounter (Signed)
Called and made patient an appointment for tomorrow Wednesday (09/13/17) at 10:30. Sent to Dr. Sarajane Jews as Juluis Rainier

## 2017-09-14 ENCOUNTER — Encounter: Payer: Self-pay | Admitting: Family Medicine

## 2017-09-14 ENCOUNTER — Ambulatory Visit: Payer: Medicare Other | Admitting: Family Medicine

## 2017-09-14 VITALS — BP 150/76 | HR 67 | Temp 97.8°F | Ht 72.0 in | Wt 220.0 lb

## 2017-09-14 DIAGNOSIS — E039 Hypothyroidism, unspecified: Secondary | ICD-10-CM | POA: Diagnosis not present

## 2017-09-14 DIAGNOSIS — N401 Enlarged prostate with lower urinary tract symptoms: Secondary | ICD-10-CM | POA: Diagnosis not present

## 2017-09-14 DIAGNOSIS — R739 Hyperglycemia, unspecified: Secondary | ICD-10-CM

## 2017-09-14 DIAGNOSIS — E538 Deficiency of other specified B group vitamins: Secondary | ICD-10-CM

## 2017-09-14 DIAGNOSIS — D509 Iron deficiency anemia, unspecified: Secondary | ICD-10-CM

## 2017-09-14 DIAGNOSIS — I10 Essential (primary) hypertension: Secondary | ICD-10-CM | POA: Diagnosis not present

## 2017-09-14 DIAGNOSIS — N138 Other obstructive and reflux uropathy: Secondary | ICD-10-CM

## 2017-09-14 LAB — CBC WITH DIFFERENTIAL/PLATELET
BASOS PCT: 0.6 % (ref 0.0–3.0)
Basophils Absolute: 0 10*3/uL (ref 0.0–0.1)
EOS ABS: 0.2 10*3/uL (ref 0.0–0.7)
EOS PCT: 3.5 % (ref 0.0–5.0)
HEMATOCRIT: 37.2 % — AB (ref 39.0–52.0)
HEMOGLOBIN: 12.3 g/dL — AB (ref 13.0–17.0)
LYMPHS PCT: 37.2 % (ref 12.0–46.0)
Lymphs Abs: 1.9 10*3/uL (ref 0.7–4.0)
MCHC: 33 g/dL (ref 30.0–36.0)
MCV: 91.4 fl (ref 78.0–100.0)
MONOS PCT: 8 % (ref 3.0–12.0)
Monocytes Absolute: 0.4 10*3/uL (ref 0.1–1.0)
NEUTROS ABS: 2.6 10*3/uL (ref 1.4–7.7)
Neutrophils Relative %: 50.7 % (ref 43.0–77.0)
Platelets: 282 10*3/uL (ref 150.0–400.0)
RBC: 4.07 Mil/uL — ABNORMAL LOW (ref 4.22–5.81)
RDW: 14.9 % (ref 11.5–15.5)
WBC: 5.2 10*3/uL (ref 4.0–10.5)

## 2017-09-14 LAB — BASIC METABOLIC PANEL
BUN: 13 mg/dL (ref 6–23)
CHLORIDE: 106 meq/L (ref 96–112)
CO2: 32 mEq/L (ref 19–32)
Calcium: 11.8 mg/dL — ABNORMAL HIGH (ref 8.4–10.5)
Creatinine, Ser: 1.25 mg/dL (ref 0.40–1.50)
GFR: 72.06 mL/min (ref >60.00)
Glucose, Bld: 108 mg/dL — ABNORMAL HIGH (ref 70–99)
POTASSIUM: 4.8 meq/L (ref 3.5–5.1)
SODIUM: 140 meq/L (ref 135–145)

## 2017-09-14 LAB — T3, FREE: T3, Free: 3.2 pg/mL (ref 2.3–4.2)

## 2017-09-14 LAB — VITAMIN B12: Vitamin B-12: 315 pg/mL (ref 211–911)

## 2017-09-14 LAB — HEPATIC FUNCTION PANEL
ALK PHOS: 47 U/L (ref 39–117)
ALT: 20 U/L (ref 0–53)
AST: 21 U/L (ref 0–37)
Albumin: 3.8 g/dL (ref 3.5–5.2)
BILIRUBIN DIRECT: 0.1 mg/dL (ref 0.0–0.3)
BILIRUBIN TOTAL: 0.6 mg/dL (ref 0.2–1.2)
Total Protein: 6.5 g/dL (ref 6.0–8.3)

## 2017-09-14 LAB — TSH: TSH: 0.78 u[IU]/mL (ref 0.35–4.50)

## 2017-09-14 LAB — HEMOGLOBIN A1C: Hgb A1c MFr Bld: 5.9 % (ref 4.6–6.5)

## 2017-09-14 LAB — T4, FREE: Free T4: 0.87 ng/dL (ref 0.60–1.60)

## 2017-09-14 MED ORDER — SOLIFENACIN SUCCINATE 5 MG PO TABS: 5.0000 mg | ORAL_TABLET | Freq: Every day | ORAL | 5 refills | Status: AC

## 2017-09-14 NOTE — Progress Notes (Signed)
   Subjective:    Patient ID: [name] [name], male    DOB: 11-25-1940, 77 y.o.   MRN: [id number]  HPI Here with his wife to discuss a number of issues. Over the past few months he has had decreased appetite and he has lost 16 lbs since January. He has fatigue and is unsteady on his feet. He seems confused at times and his memory is fading. His BP has been stable. He saw Dr. Junious Silk for BPH in January, and he was started on Vesicare. He has tried Rapaflo, Flomax, Cardura, Cialis, and other medications for this but nothing has helped much with his slow stream and his urgency. It is not clear whether he actually tried the Vesicare or not.    Review of Systems  Constitutional: Positive for appetite change, fatigue and unexpected weight change.  Respiratory: Negative.   Cardiovascular: Negative.   Gastrointestinal: Negative.   Genitourinary: Positive for difficulty urinating, frequency and urgency. Negative for dysuria and hematuria.       Objective:   Physical Exam  Constitutional: He is oriented to person, place, and time. He appears well-developed and well-nourished.  Neck: No thyromegaly present.  Cardiovascular: Normal rate, regular rhythm, normal heart sounds and intact distal pulses.  Pulmonary/Chest: Effort normal and breath sounds normal. No stridor. No respiratory distress. He has no wheezes. He has no rales.  Lymphadenopathy:    He has no cervical adenopathy.  Neurological: He is alert and oriented to person, place, and time.          Assessment & Plan:  For his BPH, I will have him try Vesicare 5 mg daily. For the other issues like fatigue, weight loss, etc we will check labs today to investigate. His hyperparathyroidism may be the culprit. If so we will have him see Endocrine again.  Alysia Penna, MD

## 2017-09-15 LAB — CALCIUM, IONIZED: Calcium, Ion: 6.85 mg/dL — ABNORMAL HIGH (ref 4.8–5.6)

## 2017-09-16 ENCOUNTER — Encounter: Payer: Self-pay | Admitting: Family Medicine

## 2017-09-16 ENCOUNTER — Ambulatory Visit: Payer: Medicare Other | Admitting: Family Medicine

## 2017-09-16 VITALS — BP 142/78 | HR 67 | Temp 97.3°F | Wt 221.1 lb

## 2017-09-16 DIAGNOSIS — E210 Primary hyperparathyroidism: Secondary | ICD-10-CM | POA: Diagnosis not present

## 2017-09-16 NOTE — Progress Notes (Signed)
   Subjective:    Patient ID: [name] [name], male    DOB: 01-Sep-1940, 77 y.o.   MRN: [id number]  HPI Here to follow up on fatigue, weight loss and decreased appetite. We believe the cause of this is his hypercalcemia from hyperparathyroidism. He had been followed by Dr. Loanne Drilling until 20212, and he had been able to avoid surgery since his lab levels remained stable. However now he is symptomatic and his recent calcium was 11.8 with an ionized calcium of 6.85.    Review of Systems  Constitutional: Positive for fatigue and unexpected weight change.  Respiratory: Negative.   Cardiovascular: Negative.   Gastrointestinal: Negative.   Neurological: Negative.        Objective:   Physical Exam  Constitutional: He is oriented to person, place, and time. He appears well-developed and well-nourished.  Neck: No thyromegaly present.  Cardiovascular: Normal rate, regular rhythm, normal heart sounds and intact distal pulses.  Pulmonary/Chest: Effort normal and breath sounds normal. No stridor. No respiratory distress. He has no wheezes. He has no rales.  Lymphadenopathy:    He has no cervical adenopathy.  Neurological: He is alert and oriented to person, place, and time.          Assessment & Plan:  Hyperparathyroidism, we will refer him back to Dr. Loanne Drilling for management.  Alysia Penna, MD

## 2017-09-30 DIAGNOSIS — J301 Allergic rhinitis due to pollen: Secondary | ICD-10-CM | POA: Diagnosis not present

## 2017-09-30 DIAGNOSIS — J3089 Other allergic rhinitis: Secondary | ICD-10-CM | POA: Diagnosis not present

## 2017-10-01 DIAGNOSIS — M1711 Unilateral primary osteoarthritis, right knee: Secondary | ICD-10-CM | POA: Diagnosis not present

## 2017-10-10 ENCOUNTER — Ambulatory Visit: Payer: Medicare Other | Admitting: Family Medicine

## 2017-10-10 ENCOUNTER — Telehealth: Payer: Self-pay | Admitting: Endocrinology

## 2017-10-10 ENCOUNTER — Encounter: Payer: Self-pay | Admitting: Family Medicine

## 2017-10-10 VITALS — BP 140/70 | HR 72 | Temp 97.6°F | Ht 70.0 in | Wt 218.8 lb

## 2017-10-10 DIAGNOSIS — F418 Other specified anxiety disorders: Secondary | ICD-10-CM

## 2017-10-10 MED ORDER — LORAZEPAM 0.5 MG PO TABS: 0.5000 mg | ORAL_TABLET | Freq: Two times a day (BID) | ORAL | 1 refills | Status: AC | PRN

## 2017-10-10 MED ORDER — SERTRALINE HCL 50 MG PO TABS: 50.0000 mg | ORAL_TABLET | Freq: Every day | ORAL | 3 refills | Status: AC

## 2017-10-10 NOTE — Telephone Encounter (Signed)
Patient's wife [name] [name] called ph# [phone]. Patient is scheduled for a NEW PATIENT appointment 10/27/17. Patient's wife feels he needs to be seen sooner. Patient was confused, he had no appetite and could not sleep-he has been very weak all weekend long. She is concerned. She is going to call Dr. Sarajane Jews (PCP), but thinks he needs to see Dr. Loanne Drilling. Please call [name] [name] at the above ph# to advise.

## 2017-10-10 NOTE — Telephone Encounter (Signed)
Please advise on below  

## 2017-10-10 NOTE — Telephone Encounter (Signed)
1 PM, 10/13/17

## 2017-10-10 NOTE — Progress Notes (Signed)
   Subjective:    Patient ID: [name] [name], male    DOB: 12/11/1940, 77 y.o.   MRN: [id number]  HPI Here with his wife for symptoms including fatigue, decreased appetite, and weight loss. He has an appt to see Dr. Loanne Drilling to follow up on hyperparathyroidism on 10-27-17. However his wife says he seems to be very depressed and somewhat anxious. 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 He has been turning over som eof these responsibilities to her. As we speak today he admits to feeling sad and overwhelmed at times. He obviously feels a sense of loss due to the fact that he is no longer capable to the things he used to do, both emotionally and physically. He has had trouble sleeping as well, and 2 nights ago he took a 2 mg Clonazepam that was prescribed for him at the [facility] clinic last January. This was clearly too strong for him because he slept all that night and half of the next day.    Review of Systems  Constitutional: Positive for fatigue.  Respiratory: Negative.   Cardiovascular: Negative.   Gastrointestinal: Negative.   Genitourinary: Negative.   Neurological: Negative.   Psychiatric/Behavioral: Positive for dysphoric mood and sleep disturbance. Negative for agitation, behavioral problems, confusion, hallucinations, self-injury and suicidal ideas. The patient is nervous/anxious.        Objective:   Physical Exam  Constitutional: He is oriented to person, place, and time. He appears well-developed and well-nourished.  Cardiovascular: Normal rate, regular rhythm, normal heart sounds and intact distal pulses.  Pulmonary/Chest: Effort normal and breath sounds normal.  Neurological: He is alert and oriented to person, place, and time.  Psychiatric:  His affect is depressed, though he has good eye contact          Assessment & Plan:  Depression with some anxiety. I now  believe all his recent fatigue and decreased appetite are due to this. We will treat with Zoloft 50 mg to take every evening with supper. He can add Lorazepam 0.5 mg as needed for anxiety. Recheck here in 2 weeks. We spent 35 minutes discussing these issues today.  Alysia Penna, MD

## 2017-10-11 NOTE — Telephone Encounter (Signed)
I have called patient's wife & scheduled patient 8/29 9:15 for 30 minutes.

## 2017-10-13 ENCOUNTER — Ambulatory Visit: Payer: Medicare Other | Admitting: Endocrinology

## 2017-10-13 ENCOUNTER — Encounter: Payer: Self-pay | Admitting: Endocrinology

## 2017-10-13 VITALS — BP 124/76 | HR 63 | Ht 70.0 in | Wt 216.8 lb

## 2017-10-13 DIAGNOSIS — E210 Primary hyperparathyroidism: Secondary | ICD-10-CM | POA: Diagnosis not present

## 2017-10-13 LAB — VITAMIN D 25 HYDROXY (VIT D DEFICIENCY, FRACTURES): VITD: 26.68 ng/mL — ABNORMAL LOW (ref 30.00–100.00)

## 2017-10-13 NOTE — Patient Instructions (Signed)
Please go back to see Dr Redmond Pulling.  you will receive a phone call, about a day and time for an appointment. If you cannot undergo the surgery, I can prescribe a pill for this, but the surgery is much better.   blood tests are requested for you today.  We'll let you know about the results.

## 2017-10-13 NOTE — Progress Notes (Signed)
Subjective:    Patient ID: [name] [name], male    DOB: Mar 24, 1940, 77 y.o.   MRN: [id number]  HPI Pt is referred by Dr Sarajane Jews, for hypercalcemia.  Pt was noted to have moderate hypercalcemia in 2010.  He has never had thyroid probs, sarcoidosis, cancer, PUD, pancreatitis, or bony fracture.  He does not take vitamin-D or A supplements.  Pt denies taking antacids, Li++, or HCTZ. I last saw this pt in 2011.  Pt was ref to and seen by surg, but he did not ret for f/u.  He has been on rx for osteoporosis since 2013.  He also has h/o urolithiasis.  He reports slight dry mouth, and assoc loss of appetite.   Past Medical History:  Diagnosis Date  . Allergy    sees Dr. Donneta Romberg   . Anemia   . Anxiety   . Arthritis   . Asthma   . Colon polyp   . Depression    sees Dr. Kimberlee Nearing in [place], [place]   . GERD (gastroesophageal reflux disease)   . Hyperlipidemia   . Hypertension   . Osteoporosis 01-2010  . RBBB (right bundle branch block)    stable  . Sleep apnea    cpap    Past Surgical History:  Procedure Laterality Date  . COLONOSCOPY  03-14-14   per Dr. Earlean Shawl, polyps, repeat in 3 yrs  . ELBOW SURGERY     right  . 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 Marital status: Married    Spouse name: Not on file  . Number of children: y  . Years of education: Not on file  . Highest education level: Not on file  Occupational History  . Occupation: Public relations account executive: RETIRED  Social Needs  . Financial resource strain: Not on file  . Food insecurity:    Worry: Not on file    Inability: Not on file  . Transportation needs:    Medical: Not on file    Non-medical: Not on file  Tobacco Use  . Smoking status: Former Smoker    Packs/day: 1.00    Years: 27.00    Pack years: 27.00    Types: Cigarettes    Last  attempt to quit: 02/15/1986    Years since quitting: 31.6  . Smokeless tobacco: Never Used  Substance and Sexual Activity  . Alcohol use: Yes    Alcohol/week: 0.0 - 3.0 standard drinks    Comment: Occ  . Drug use: No  . Sexual activity: Yes  Lifestyle  . Physical activity:    Days per week: Not on file    Minutes per session: Not on file  . Stress: Not on file  Relationships  . Social connections:    Talks on phone: Not on file    Gets together: Not on file    Attends religious service: Not on file    Active member of club or organization: Not on file    Attends meetings of clubs or organizations: Not on file    Relationship status: Not on file  . Intimate partner violence:    Fear of current or ex partner: Not on file    Emotionally abused: Not on file    Physically abused: Not on file    Forced sexual activity: Not  on file  Other Topics Concern  . Not on file  Social History Narrative  . Not on file    Current Outpatient Medications on File Prior to Visit  Medication Sig Dispense Refill  . alendronate (FOSAMAX) 70 MG tablet TAKE 1 TABLET BY MOUTH EVERY 7 DAYS. TAKE WITH FULL GLASS OF WATER ON AN EMPTY STOMACH 12 tablet 3  . amLODipine (NORVASC) 10 MG tablet TAKE 1 TABLET (10 MG TOTAL) BY MOUTH DAILY. 90 tablet 3  . aspirin 81 MG tablet Take 81 mg by mouth daily as needed for pain (as needed).     Marland Kitchen azelastine (ASTELIN) 0.1 % nasal spray PLACE 1-2 PUFF IN EACH NOSTRIL TWICE A DAY NASALLY 30 DAY(S)    . benazepril (LOTENSIN) 10 MG tablet Take 1 tablet (10 mg total) by mouth daily. 90 tablet 3  . cetirizine (ZYRTEC) 10 MG tablet Take 10 mg by mouth daily.     . clonazePAM (KLONOPIN) 2 MG tablet TAKE 1 TABLET BY MOUTH AT HOUR OF SLEEP  5  . EPINEPHrine 0.3 mg/0.3 mL IJ SOAJ injection USE AS DIRECTED AS NEEDED  1  . ferrous sulfate 325 (65 FE) MG tablet TAKE 1 TABLET BY MOUTH 2 TIMES DAILY WITH A MEAL. 60 tablet 11  . LORazepam (ATIVAN) 0.5 MG tablet Take 1 tablet (0.5 mg  total) by mouth 2 (two) times daily as needed for anxiety. 60 tablet 1  . meloxicam (MOBIC) 15 MG tablet     . metoprolol tartrate (LOPRESSOR) 50 MG tablet Take 1 tablet (50 mg total) by mouth 2 (two) times daily. 180 tablet 3  . montelukast (SINGULAIR) 10 MG tablet Take 1 tablet (10 mg total) by mouth every evening. 90 tablet 3  . Multiple Vitamin (MULTIVITAMIN) tablet Take 1 tablet by mouth daily.     Marland Kitchen omega-3 acid ethyl esters (LOVAZA) 1 g capsule Take by mouth.    Marland Kitchen omeprazole (PRILOSEC) 20 MG capsule Take 1 capsule (20 mg total) by mouth daily. 90 capsule 3  . sertraline (ZOLOFT) 50 MG tablet Take 1 tablet (50 mg total) by mouth daily. 30 tablet 3  . simvastatin (ZOCOR) 20 MG tablet TAKE 1 TABLET (20 MG TOTAL) BY MOUTH DAILY. 90 tablet 3  . solifenacin (VESICARE) 5 MG tablet Take 1 tablet (5 mg total) by mouth daily. 30 tablet 5  . vitamin B-12 (CYANOCOBALAMIN) 500 MCG tablet Take 500 mcg by mouth daily.    . [DISCONTINUED] amLODipine-benazepril (LOTREL) 10-20 MG per capsule Take 1 capsule by mouth daily. 30 capsule 11   No current facility-administered medications on file prior to visit.     No Known Allergies  Family History  Problem Relation Age of Onset  . Cancer Mother        stomach  . Hypercalcemia Neg Hx     BP 124/76 (BP Location: Left Arm, Patient Position: Sitting, Cuff Size: Normal)   Pulse 63   Ht '5\' 10"'$  (1.778 m)   Wt 216 lb 12.8 oz (98.3 kg)   SpO2 97%   BMI 31.11 kg/m     Review of Systems denies gynecomastia, hematuria, numbness, abdominal pain, hypoglycemia, skin rash, visual loss, sob, diarrhea, and easy bruising.  He recently started rx for depression.  He has cold intolerance, rhinorrhea, urinary frequency, memory loss, muscle weakness, arthralgias, and fatigue.      Objective:   Physical Exam VS: see vs page GEN: no distress HEAD: head: no deformity eyes: no periorbital swelling, no proptosis external  nose and ears are normal mouth: no lesion  seen NECK: supple, thyroid is not enlarged CHEST WALL: no deformity.  No kyphosis.   LUNGS: clear to auscultation CV: reg rate and rhythm, no murmur.   ABD: abdomen is soft, nontender.  no hepatosplenomegaly.  not distended.  no hernia.   MUSCULOSKELETAL: muscle bulk and strength are grossly normal.  no obvious joint swelling.  gait is slow but steady.   EXTEMITIES: no leg edema PULSES: no carotid bruit NEURO:  cn 2-12 grossly intact.   readily moves all 4's.  sensation is intact to touch on all 4's.   SKIN:  Normal texture and temperature.  No rash or suspicious lesion is visible.   NODES:  None palpable at the neck PSYCH: alert, well-oriented.  Does not appear anxious nor depressed.    Lab Results  Component Value Date   PTH 212.2 (H) 08/12/2011   CALCIUM 11.8 (H) 09/14/2017   CAION 6.85 (H) 09/14/2017    Lab Results  Component Value Date   TSH 0.78 09/14/2017   DEXA (2015): normal  I have reviewed outside records, and summarized:  Pt was noted to have elevated Ca++, and referred here.  He was noted to have sxs of weight loss, fatigue, and weight loss.     Assessment & Plan:  Primary hyperparathyroidism, worse.  She has multiple indications for surgery.  If she cannot or will not undergo surgery, I could rx cinacalcet, but surg is preferred.    Patient Instructions  Please go back to see Dr Redmond Pulling.  you will receive a phone call, about a day and time for an appointment. If you cannot undergo the surgery, I can prescribe a pill for this, but the surgery is much better.   blood tests are requested for you today.  We'll let you know about the results.

## 2017-10-18 ENCOUNTER — Telehealth: Payer: Self-pay | Admitting: Endocrinology

## 2017-10-18 ENCOUNTER — Telehealth: Payer: Self-pay | Admitting: Emergency Medicine

## 2017-10-18 LAB — PTH, INTACT AND CALCIUM
CALCIUM: 12 mg/dL — AB (ref 8.6–10.3)
PTH: 209 pg/mL — AB (ref 14–64)

## 2017-10-18 NOTE — Telephone Encounter (Signed)
Pts wife called and wants to know if you can call her back with the patients lab results. Please give her a call back thanks.

## 2017-10-18 NOTE — Telephone Encounter (Signed)
Please call patient at ph# [phone] to give the contact info for Dr. Redmond Pulling (Surgeon)

## 2017-10-19 NOTE — Telephone Encounter (Signed)
Information was given to pt wife

## 2017-10-19 NOTE — Telephone Encounter (Signed)
Pt wife stated that you were referring them to Dr. Redmond Pulling, do you have a number for him or an office he works out of?

## 2017-10-19 NOTE — Telephone Encounter (Signed)
[facility]

## 2017-10-24 ENCOUNTER — Ambulatory Visit: Payer: Medicare Other | Admitting: Family Medicine

## 2017-10-24 DIAGNOSIS — E210 Primary hyperparathyroidism: Secondary | ICD-10-CM | POA: Diagnosis not present

## 2017-10-25 ENCOUNTER — Encounter: Payer: Self-pay | Admitting: Family Medicine

## 2017-10-25 ENCOUNTER — Other Ambulatory Visit: Payer: Self-pay | Admitting: Surgery

## 2017-10-25 ENCOUNTER — Ambulatory Visit: Payer: Medicare Other | Admitting: Family Medicine

## 2017-10-25 VITALS — BP 122/74 | HR 73 | Temp 98.2°F | Ht 70.0 in | Wt 214.0 lb

## 2017-10-25 DIAGNOSIS — E0590 Thyrotoxicosis, unspecified without thyrotoxic crisis or storm: Secondary | ICD-10-CM

## 2017-10-25 DIAGNOSIS — F418 Other specified anxiety disorders: Secondary | ICD-10-CM

## 2017-10-25 DIAGNOSIS — E210 Primary hyperparathyroidism: Secondary | ICD-10-CM | POA: Diagnosis not present

## 2017-10-25 DIAGNOSIS — I10 Essential (primary) hypertension: Secondary | ICD-10-CM

## 2017-10-25 DIAGNOSIS — Z23 Encounter for immunization: Secondary | ICD-10-CM | POA: Diagnosis not present

## 2017-10-25 MED ORDER — VITAMIN D 1000 UNITS PO TABS: 1000.0000 [IU] | ORAL_TABLET | Freq: Every day | ORAL | 0 refills | Status: AC

## 2017-10-25 NOTE — Progress Notes (Signed)
   Subjective:    Patient ID: [name] [name], male    DOB: Dec 08, 1940, 77 y.o.   MRN: [id number]  HPI Here with his wife to follow up on mild depression and anxiety, with some memory issues as well. We met 2 weeks ago and started him on Sertraline 50 mg daily along with Lorazepam. His wife has been giving him the Lorazepam twice daily and this has really helped the anxiety. The depression seems to be about the same. His appetite is poor and he has lost 2 more lbs. He does not sleep well. In addition he had more labs drawn recently per Dr. Loanne Drilling and his PTH has risen to 209 and the calcium has risen to 12.0. There is no doubt that these high calcium levels are contributing to his mood and cognition difficulties. He has seen Dr. Harlow Asa with a view to have the parathyroid glands surgically removed, and he is scheduled for a neck US in the next few days.    Review of Systems  Constitutional: Positive for fatigue and unexpected weight change.  Respiratory: Negative.   Cardiovascular: Negative.   Gastrointestinal: Negative.   Neurological: Positive for weakness.  Psychiatric/Behavioral: Positive for decreased concentration, dysphoric mood and sleep disturbance. Negative for agitation, confusion and hallucinations. The patient is nervous/anxious.        Objective:   Physical Exam  Constitutional: He is oriented to person, place, and time. He appears well-developed and well-nourished.  Neck: No thyromegaly present.  Cardiovascular: Normal rate, regular rhythm, normal heart sounds and intact distal pulses.  Pulmonary/Chest: Effort normal and breath sounds normal.  Lymphadenopathy:    He has no cervical adenopathy.  Neurological: He is alert and oriented to person, place, and time.  Psychiatric: He has a normal mood and affect. His behavior is normal. Thought content normal.          Assessment & Plan:  His anxiety has improved somewhat but the depression is about the same. I think his  hypercalcemia is causing a lot of his difficulties. We decided to keep his medications at the current level. Hopefully if he can have the surgery, he will feel better as his calcium level starts to come back down. We will follow along with Surgery and Endocrine on this.  Alysia Penna, MD

## 2017-10-27 ENCOUNTER — Ambulatory Visit: Payer: Medicare Other | Admitting: Endocrinology

## 2017-10-31 ENCOUNTER — Ambulatory Visit
Admission: RE | Admit: 2017-10-31 | Discharge: 2017-10-31 | Disposition: A | Discharge status: Home / Self Care | Payer: Medicare Other | Source: Ambulatory Visit | Attending: Surgery | Admitting: Surgery

## 2017-10-31 DIAGNOSIS — E041 Nontoxic single thyroid nodule: Secondary | ICD-10-CM | POA: Diagnosis not present

## 2017-10-31 DIAGNOSIS — E0590 Thyrotoxicosis, unspecified without thyrotoxic crisis or storm: Secondary | ICD-10-CM

## 2017-11-01 ENCOUNTER — Ambulatory Visit: Payer: Self-pay | Admitting: Surgery

## 2017-11-01 ENCOUNTER — Ambulatory Visit: Payer: Medicare Other | Admitting: Sports Medicine

## 2017-11-04 DIAGNOSIS — J301 Allergic rhinitis due to pollen: Secondary | ICD-10-CM | POA: Diagnosis not present

## 2017-11-04 DIAGNOSIS — J3089 Other allergic rhinitis: Secondary | ICD-10-CM | POA: Diagnosis not present

## 2017-11-08 NOTE — Pre-Procedure Instructions (Signed)
[place]  11/08/2017      [id number] - [address], [place] [address] [place]  [address] Phone: [phone] Fax: [phone]    Your procedure is scheduled on November 14, 2017.  Report to [facility] Admitting at 530 AM.  Call this number if you have problems the morning of surgery:  [phone]   Remember:  Do not eat or drink after midnight.    Take these medicines the morning of surgery with A SIP OF WATER  Amlodipine (norvasc) Astelin Nasal spray-if needed Cetirizine (zyrtec) Eye Drops-if needed Lorazepam (ativan)- if needed Metoprolol tartrate (lopressor) Montelukast (singulair) Omeprazole (prilosec) Sertraline (zoloft)  Follow your surgeon's instructions on when to hold/resume aspirin.  If no instructions were given call the office to determine how they would like to you take aspirin  7 days prior to surgery STOP taking any Aleve, Naproxen, Ibuprofen, Motrin, Advil, Goody's, BC's, all herbal medications, fish oil, and all vitamins    Do not wear jewelry, make-up or nail polish.  Do not wear lotions, powders, or perfumes, or deodorant.  Men may shave face and neck.  Do not bring valuables to the hospital.  [facility] is not responsible for any belongings or valuables.  Contacts, dentures or bridgework may not be worn into surgery.  Leave your suitcase in the car.  After surgery it may be brought to your room.  For patients admitted to the hospital, discharge time will be determined by your treatment team.  Patients discharged the day of surgery will not be allowed to drive home.    [facility]- Preparing For Surgery  Before surgery, you can play an important role. Because skin is not sterile, your skin needs to be as free of germs as possible. You can reduce the number of germs on your skin by washing with CHG (chlorahexidine gluconate) Soap before surgery.  CHG is an antiseptic cleaner which kills germs  and bonds with the skin to continue killing germs even after washing.    Oral Hygiene is also important to reduce your risk of infection.  Remember - BRUSH YOUR TEETH THE MORNING OF SURGERY WITH YOUR REGULAR TOOTHPASTE  Please do not use if you have an allergy to CHG or antibacterial soaps. If your skin becomes reddened/irritated stop using the CHG.  Do not shave (including legs and underarms) for at least 48 hours prior to first CHG shower. It is OK to shave your face.  Please follow these instructions carefully.   1. Shower the NIGHT BEFORE SURGERY and the MORNING OF SURGERY with CHG.   2. If you chose to wash your hair, wash your hair first as usual with your normal shampoo.  3. After you shampoo, rinse your hair and body thoroughly to remove the shampoo.  4. Use CHG as you would any other liquid soap. You can apply CHG directly to the skin and wash gently with a scrungie or a clean washcloth.   5. Apply the CHG Soap to your body ONLY FROM THE NECK DOWN.  Do not use on open wounds or open sores. Avoid contact with your eyes, ears, mouth and genitals (private parts). Wash Face and genitals (private parts)  with your normal soap.  6. Wash thoroughly, paying special attention to the area where your surgery will be performed.  7. Thoroughly rinse your body with warm water from the neck down.  8. DO NOT shower/wash with your normal soap after using and  rinsing off the CHG Soap.  9. Pat yourself dry with a CLEAN TOWEL.  10. Wear CLEAN PAJAMAS to bed the night before surgery, wear comfortable clothes the morning of surgery  11. Place CLEAN SHEETS on your bed the night of your first shower and DO NOT SLEEP WITH PETS.   Day of Surgery:  Do not apply any deodorants/lotions.  Please wear clean clothes to the hospital/surgery center.   Remember to brush your teeth WITH YOUR REGULAR TOOTHPASTE.   Please read over the fact sheets that you were given.

## 2017-11-08 NOTE — Progress Notes (Addendum)
PCP: Alysia Penna, MD  Cardiologist: pt denies  EKG: obtained at PAT appt-11/09/17  Stress test: pt denies  ECHO: pt denies  Cardiac Cath: pt denies  Chest x-ray:obtained at PAT appt 11/09/17  Patient on Aspirin 81mg -began holding 11/02/17 per MD instructions.

## 2017-11-09 ENCOUNTER — Ambulatory Visit (HOSPITAL_COMMUNITY)
Admission: RE | Admit: 2017-11-09 | Discharge: 2017-11-09 | Disposition: A | Discharge status: Home / Self Care | Payer: Medicare Other | Source: Ambulatory Visit | Attending: Anesthesiology | Admitting: Anesthesiology

## 2017-11-09 ENCOUNTER — Encounter (HOSPITAL_COMMUNITY): Payer: Self-pay

## 2017-11-09 ENCOUNTER — Encounter (HOSPITAL_COMMUNITY)
Admission: RE | Admit: 2017-11-09 | Discharge: 2017-11-09 | Disposition: A | Discharge status: Home / Self Care | Payer: Medicare Other | Source: Ambulatory Visit | Attending: Surgery | Admitting: Surgery

## 2017-11-09 ENCOUNTER — Other Ambulatory Visit: Payer: Self-pay

## 2017-11-09 DIAGNOSIS — Z01818 Encounter for other preprocedural examination: Secondary | ICD-10-CM | POA: Diagnosis not present

## 2017-11-09 LAB — CBC
HCT: 39.4 % (ref 39.0–52.0)
Hemoglobin: 12.2 g/dL — ABNORMAL LOW (ref 13.0–17.0)
MCH: 29.4 pg (ref 26.0–34.0)
MCHC: 31 g/dL (ref 30.0–36.0)
MCV: 94.9 fL (ref 78.0–100.0)
PLATELETS: 315 10*3/uL (ref 150–400)
RBC: 4.15 MIL/uL — ABNORMAL LOW (ref 4.22–5.81)
RDW: 15 % (ref 11.5–15.5)
WBC: 6.6 10*3/uL (ref 4.0–10.5)

## 2017-11-09 LAB — BASIC METABOLIC PANEL
Anion gap: 6 (ref 5–15)
BUN: 13 mg/dL (ref 8–23)
CO2: 25 mmol/L (ref 22–32)
CREATININE: 1.2 mg/dL (ref 0.61–1.24)
Calcium: 11.8 mg/dL — ABNORMAL HIGH (ref 8.9–10.3)
Chloride: 108 mmol/L (ref 98–111)
GFR calc Af Amer: >60 mL/min (ref >60)
GFR, EST NON AFRICAN AMERICAN: 57 mL/min — AB (ref >60)
Glucose, Bld: 104 mg/dL — ABNORMAL HIGH (ref 70–99)
Potassium: 4.4 mmol/L (ref 3.5–5.1)
SODIUM: 139 mmol/L (ref 135–145)

## 2017-11-13 ENCOUNTER — Encounter (HOSPITAL_COMMUNITY): Payer: Self-pay | Admitting: Surgery

## 2017-11-13 NOTE — Anesthesia Preprocedure Evaluation (Addendum)
Anesthesia Evaluation  Patient identified by MRN, date of birth, ID band Patient awake    Reviewed: Allergy & Precautions, NPO status , Patient's Chart, lab work & pertinent test results  Airway Mallampati: II  TM Distance: >3 FB Neck ROM: Full    Dental no notable dental hx. (+) Upper Dentures, Lower Dentures   Pulmonary asthma , sleep apnea , former smoker,    Pulmonary exam normal breath sounds clear to auscultation       Cardiovascular hypertension, Pt. on medications Normal cardiovascular exam Rhythm:Regular Rate:Normal     Neuro/Psych    GI/Hepatic   Endo/Other    Renal/GU      Musculoskeletal   Abdominal   Peds  Hematology  (+) anemia ,   Anesthesia Other Findings RBBB  Reproductive/Obstetrics                            Anesthesia Physical Anesthesia Plan  ASA: III  Anesthesia Plan: General   Post-op Pain Management:    Induction: Intravenous  PONV Risk Score and Plan: 2 and Treatment may vary due to age or medical condition, Ondansetron and Dexamethasone  Airway Management Planned: Oral ETT  Additional Equipment:   Intra-op Plan:   Post-operative Plan: Extubation in OR  Informed Consent: I have reviewed the patients History and Physical, chart, labs and discussed the procedure including the risks, benefits and alternatives for the proposed anesthesia with the patient or authorized representative who has indicated his/her understanding and acceptance.   Dental advisory given  Plan Discussed with:   Anesthesia Plan Comments:        Anesthesia Quick Evaluation

## 2017-11-13 NOTE — H&P (Signed)
General Surgery [facility]  [name] [name] DOB: 1940/04/17 Married / Language: English / Race: Black or African American Male   History of Present Illness  The patient is a 77 year old male who presents with primary hyperparathyroidism.  CHIEF COMPLAINT: primary hyperparathyroidism  Patient is referred by Dr. Renato Shin for surgical evaluation and management of primary hyperparathyroidism. Patient's primary care physician is Dr. Alysia Penna. Patient has a long-standing history of hyperparathyroidism. He has a nuclear medicine parathyroid scan in 2013 which localized a left inferior parathyroid adenoma. Patient has multiple complications including memory loss, fatigue, nephrolithiasis, and osteoporosis. Most recent laboratory studies show a calcium level of 12.0, and intact PTH level of 209, and a vitamin D level of 26.68. Patient has not had any other imaging studies. He presents today accompanied by his family. Patient has had no prior head or neck surgery.   Past Surgical History Colon Polyp Removal - Colonoscopy  Knee Surgery  Left.  Diagnostic Studies History Colonoscopy  1-5 years ago  Allergies No Known Drug Allergies [10/24/2017]: Allergies Reconciled   Medication History Alendronate Sodium (70MG  Tablet, Oral) Active. amLODIPine Besylate (10MG  Tablet, Oral) Active. Aspirin (81MG  Tablet, Oral) Active. Azelastine HCl (0.1% Solution, Nasal) Active. Benazepril HCl (10MG  Tablet, Oral) Active. Cetirizine HCl (10MG  Tablet, Oral) Active. EPINEPHrine (0.3MG /0.3ML Soln Auto-inj, Injection) Active. Ferrous Sulfate (325 (65 Fe)MG Tablet, Oral) Active. LORazepam (0.5MG  Tablet, Oral) Active. Meloxicam (15MG  Tablet, Oral) Active. Metoprolol Tartrate (50MG  Tablet, Oral) Active. Montelukast Sodium (10MG  Tablet, Oral) Active. Multi-Vitamin (Oral) Active. Omega 3 (1000MG  Capsule, Oral) Active. Omeprazole (20MG  Packet, Oral)  Active. Sertraline HCl (50MG  Tablet, Oral) Active. Simvastatin (20MG  Tablet, Oral) Active. Solifenacin Succinate (5MG  Tablet, Oral) Active. Vitamin B12 (500MCG Tablet, Oral) Active. Vitamin D3 (Oral) Specific strength unknown - Active. Medications Reconciled  Social History Alcohol use  Occasional alcohol use. Caffeine use  Coffee. No drug use  Tobacco use  Former smoker.  Family History Alcohol Abuse  Brother. Cancer  Brother, Mother. Colon Cancer  Mother. Diabetes Mellitus  Sister. Hypertension  Mother. Respiratory Condition  Brother.  Other Problems Anxiety Disorder  Arthritis  Bladder Problems  Depression  Gastroesophageal Reflux Disease  Hemorrhoids  High blood pressure  Hypercholesterolemia  Kidney Stone  Sleep Apnea     Review of Systems General Present- Appetite Loss, Fatigue and Weight Loss. Not Present- Chills, Fever, Night Sweats and Weight Gain. Skin Not Present- Change in Wart/Mole, Dryness, Hives, Jaundice, New Lesions, Non-Healing Wounds, Rash and Ulcer. HEENT Present- Hoarseness, Seasonal Allergies, Sore Throat and Wears glasses/contact lenses. Not Present- Earache, Hearing Loss, Nose Bleed, Oral Ulcers, Ringing in the Ears, Sinus Pain, Visual Disturbances and Yellow Eyes. Respiratory Not Present- Bloody sputum, Chronic Cough, Difficulty Breathing, Snoring and Wheezing. Breast Not Present- Breast Mass, Breast Pain, Nipple Discharge and Skin Changes. Cardiovascular Not Present- Chest Pain, Difficulty Breathing Lying Down, Leg Cramps, Palpitations, Rapid Heart Rate, Shortness of Breath and Swelling of Extremities. Gastrointestinal Present- Constipation. Not Present- Abdominal Pain, Bloating, Bloody Stool, Change in Bowel Habits, Chronic diarrhea, Difficulty Swallowing, Excessive gas, Gets full quickly at meals, Hemorrhoids, Indigestion, Nausea, Rectal Pain and Vomiting. Male Genitourinary Present- Frequency and Impotence. Not  Present- Blood in Urine, Change in Urinary Stream, Nocturia, Painful Urination, Urgency and Urine Leakage. Musculoskeletal Not Present- Back Pain, Joint Pain, Joint Stiffness, Muscle Pain, Muscle Weakness and Swelling of Extremities. Neurological Present- Decreased Memory, Headaches, Trouble walking and Weakness. Not Present- Fainting, Numbness, Seizures, Tingling and Tremor. Psychiatric Present- Anxiety, Change in Sleep  Pattern, Depression and Fearful. Not Present- Bipolar and Frequent crying. Endocrine Present- Cold Intolerance and Hair Changes. Not Present- Excessive Hunger, Heat Intolerance, Hot flashes and New Diabetes.  Vitals Weight: 214.6 lb Height: 72in Body Surface Area: 2.2 m Body Mass Index: 29.1 kg/m  Temp.: 97.79F  Pulse: 70 (Regular)  BP: 126/88 (Sitting, Left Arm, Standard)   Physical Exam  See vital signs recorded above  GENERAL APPEARANCE Development: normal Nutritional status: normal Gross deformities: none  SKIN Rash, lesions, ulcers: none Induration, erythema: none Nodules: none palpable  EYES Conjunctiva and lids: normal Pupils: equal and reactive Iris: normal bilaterally  EARS, NOSE, MOUTH, THROAT External ears: no lesion or deformity External nose: no lesion or deformity Hearing: grossly normal Lips: no lesion or deformity Dentition: normal for age Oral mucosa: moist  NECK Symmetric: yes Trachea: midline Thyroid: no palpable nodules in the thyroid bed  CHEST Respiratory effort: normal Retraction or accessory muscle use: no Breath sounds: normal bilaterally Rales, rhonchi, wheeze: none  CARDIOVASCULAR Auscultation: regular rhythm, normal rate Murmurs: none Pulses: carotid and radial pulse 2+ palpable Lower extremity edema: none Lower extremity varicosities: none  MUSCULOSKELETAL Station and gait: normal Digits and nails: no clubbing or cyanosis Muscle strength: grossly normal all extremities Range of motion: grossly  normal all extremities Deformity: none  LYMPHATIC Cervical: none palpable Supraclavicular: none palpable  PSYCHIATRIC Oriented to person, place, and time: yes Mood and affect: normal for situation Judgment and insight: appropriate for situation    Assessment & Plan  PRIMARY HYPERPARATHYROIDISM (E21.0)  Follow Up - Call [facility] office after tests / studies doneto discuss further plans  Patient presents on referral from his endocrinologist with primary hyperparathyroidism. He is accompanied by his family. They are provided with written literature on parathyroid disease to review at home.  Patient has signs and symptoms of primary hyperparathyroidism. Laboratory studies are consistent with primary hyperparathyroidism. Patient had a nuclear medicine parathyroid scan which was positive for a left inferior parathyroid adenoma in 2013. I would like to obtain an ultrasound examination of the neck to evaluate the thyroid for any possible problems, and hopefully confirm the presence of a left inferior parathyroid adenoma. We will make arrangements for the study in the near future.  We discussed minimally invasive parathyroid surgery. We discussed doing this as an outpatient. We discussed the location of the surgical incision. We discussed his postoperative recovery. We discussed the fact that this may improve his memory symptoms and his history of depression. Patient would like to proceed with evaluation and if necessary, with surgery in the near future.  USN demonstrates a left inferior adenoma.  Plan minimally invasive parathyroidectomy.  The risks and benefits of the procedure have been discussed at length with the patient.  The patient understands the proposed procedure, potential alternative treatments, and the course of recovery to be expected.  All of the patient's questions have been answered at this time.  The patient wishes to proceed with surgery.  Armandina Gemma, [place] [facility] Office: [phone]

## 2017-11-14 ENCOUNTER — Encounter (HOSPITAL_COMMUNITY): Payer: Self-pay

## 2017-11-14 ENCOUNTER — Ambulatory Visit (HOSPITAL_COMMUNITY)
Admission: RE | Admit: 2017-11-14 | Discharge: 2017-11-14 | Disposition: A | Discharge status: Home / Self Care | Payer: Medicare Other | Source: Ambulatory Visit | Attending: Surgery | Admitting: Surgery

## 2017-11-14 ENCOUNTER — Ambulatory Visit (HOSPITAL_COMMUNITY): Payer: Medicare Other | Admitting: Anesthesiology

## 2017-11-14 ENCOUNTER — Other Ambulatory Visit: Payer: Self-pay

## 2017-11-14 ENCOUNTER — Encounter (HOSPITAL_COMMUNITY)
Admission: RE | Disposition: A | Discharge status: Home / Self Care | Payer: Self-pay | Source: Ambulatory Visit | Attending: Surgery

## 2017-11-14 DIAGNOSIS — M1990 Unspecified osteoarthritis, unspecified site: Secondary | ICD-10-CM | POA: Diagnosis not present

## 2017-11-14 DIAGNOSIS — N200 Calculus of kidney: Secondary | ICD-10-CM | POA: Insufficient documentation

## 2017-11-14 DIAGNOSIS — Z7982 Long term (current) use of aspirin: Secondary | ICD-10-CM | POA: Diagnosis not present

## 2017-11-14 DIAGNOSIS — J45909 Unspecified asthma, uncomplicated: Secondary | ICD-10-CM | POA: Insufficient documentation

## 2017-11-14 DIAGNOSIS — E210 Primary hyperparathyroidism: Secondary | ICD-10-CM | POA: Diagnosis not present

## 2017-11-14 DIAGNOSIS — F329 Major depressive disorder, single episode, unspecified: Secondary | ICD-10-CM | POA: Diagnosis not present

## 2017-11-14 DIAGNOSIS — D351 Benign neoplasm of parathyroid gland: Secondary | ICD-10-CM | POA: Insufficient documentation

## 2017-11-14 DIAGNOSIS — D649 Anemia, unspecified: Secondary | ICD-10-CM | POA: Diagnosis not present

## 2017-11-14 DIAGNOSIS — Z8249 Family history of ischemic heart disease and other diseases of the circulatory system: Secondary | ICD-10-CM | POA: Insufficient documentation

## 2017-11-14 DIAGNOSIS — F419 Anxiety disorder, unspecified: Secondary | ICD-10-CM | POA: Insufficient documentation

## 2017-11-14 DIAGNOSIS — Z79899 Other long term (current) drug therapy: Secondary | ICD-10-CM | POA: Insufficient documentation

## 2017-11-14 DIAGNOSIS — K219 Gastro-esophageal reflux disease without esophagitis: Secondary | ICD-10-CM | POA: Diagnosis not present

## 2017-11-14 DIAGNOSIS — Z87891 Personal history of nicotine dependence: Secondary | ICD-10-CM | POA: Diagnosis not present

## 2017-11-14 DIAGNOSIS — Z800 Family history of malignant neoplasm of digestive organs: Secondary | ICD-10-CM | POA: Insufficient documentation

## 2017-11-14 DIAGNOSIS — I10 Essential (primary) hypertension: Secondary | ICD-10-CM | POA: Insufficient documentation

## 2017-11-14 DIAGNOSIS — Z791 Long term (current) use of non-steroidal anti-inflammatories (NSAID): Secondary | ICD-10-CM | POA: Diagnosis not present

## 2017-11-14 DIAGNOSIS — E7800 Pure hypercholesterolemia, unspecified: Secondary | ICD-10-CM | POA: Diagnosis not present

## 2017-11-14 DIAGNOSIS — G4733 Obstructive sleep apnea (adult) (pediatric): Secondary | ICD-10-CM | POA: Diagnosis not present

## 2017-11-14 DIAGNOSIS — G4730 Sleep apnea, unspecified: Secondary | ICD-10-CM | POA: Insufficient documentation

## 2017-11-14 DIAGNOSIS — E785 Hyperlipidemia, unspecified: Secondary | ICD-10-CM | POA: Diagnosis not present

## 2017-11-14 HISTORY — PX: PARATHYROIDECTOMY: SHX19

## 2017-11-14 SURGERY — PARATHYROIDECTOMY
Anesthesia: General | Site: Neck | Laterality: Left

## 2017-11-14 MED ORDER — DEXAMETHASONE SODIUM PHOSPHATE 10 MG/ML IJ SOLN
INTRAMUSCULAR | Status: AC
  Filled 2017-11-14: qty 1

## 2017-11-14 MED ORDER — MIDAZOLAM HCL 2 MG/2ML IJ SOLN
INTRAMUSCULAR | Status: AC
  Filled 2017-11-14: qty 2

## 2017-11-14 MED ORDER — FENTANYL CITRATE (PF) 250 MCG/5ML IJ SOLN
INTRAMUSCULAR | Status: AC
  Filled 2017-11-14: qty 5

## 2017-11-14 MED ORDER — FENTANYL CITRATE (PF) 250 MCG/5ML IJ SOLN
INTRAMUSCULAR | Status: DC | PRN
  Administered 2017-11-14: 50 ug via INTRAVENOUS
  Administered 2017-11-14: 100 ug via INTRAVENOUS

## 2017-11-14 MED ORDER — ACETAMINOPHEN 10 MG/ML IV SOLN: 1000.0000 mg | Freq: Once | INTRAVENOUS | Status: DC | PRN

## 2017-11-14 MED ORDER — PROPOFOL 10 MG/ML IV BOLUS
INTRAVENOUS | Status: AC
  Filled 2017-11-14: qty 40

## 2017-11-14 MED ORDER — LIDOCAINE 2% (20 MG/ML) 5 ML SYRINGE
INTRAMUSCULAR | Status: DC | PRN
  Administered 2017-11-14: 100 mg via INTRAVENOUS

## 2017-11-14 MED ORDER — TRAMADOL HCL 50 MG PO TABS: 50.0000 mg | ORAL_TABLET | Freq: Four times a day (QID) | ORAL | 0 refills | Status: AC | PRN

## 2017-11-14 MED ORDER — PROMETHAZINE HCL 25 MG/ML IJ SOLN: 6.2500 mg | INTRAMUSCULAR | Status: DC | PRN

## 2017-11-14 MED ORDER — GLYCOPYRROLATE PF 0.2 MG/ML IJ SOSY
PREFILLED_SYRINGE | INTRAMUSCULAR | Status: AC
  Filled 2017-11-14: qty 1

## 2017-11-14 MED ORDER — 0.9 % SODIUM CHLORIDE (POUR BTL) OPTIME
TOPICAL | Status: DC | PRN
  Administered 2017-11-14: 1000 mL

## 2017-11-14 MED ORDER — CEFAZOLIN SODIUM-DEXTROSE 2-4 GM/100ML-% IV SOLN
INTRAVENOUS | Status: AC
  Filled 2017-11-14: qty 100

## 2017-11-14 MED ORDER — LACTATED RINGERS IV SOLN
INTRAVENOUS | Status: DC | PRN
  Administered 2017-11-14: 07:00:00 via INTRAVENOUS

## 2017-11-14 MED ORDER — MEPERIDINE HCL 50 MG/ML IJ SOLN: 6.2500 mg | INTRAMUSCULAR | Status: DC | PRN

## 2017-11-14 MED ORDER — HEMOSTATIC AGENTS (NO CHARGE) OPTIME
TOPICAL | Status: DC | PRN
  Administered 2017-11-14: 1 via TOPICAL

## 2017-11-14 MED ORDER — SUGAMMADEX SODIUM 200 MG/2ML IV SOLN
INTRAVENOUS | Status: DC | PRN
  Administered 2017-11-14: 195 mg via INTRAVENOUS

## 2017-11-14 MED ORDER — HYDROCODONE-ACETAMINOPHEN 7.5-325 MG PO TABS: 1.0000 | ORAL_TABLET | Freq: Once | ORAL | Status: DC | PRN

## 2017-11-14 MED ORDER — BUPIVACAINE HCL (PF) 0.25 % IJ SOLN
INTRAMUSCULAR | Status: DC | PRN
  Administered 2017-11-14: 30 mL

## 2017-11-14 MED ORDER — PHENYLEPHRINE 40 MCG/ML (10ML) SYRINGE FOR IV PUSH (FOR BLOOD PRESSURE SUPPORT)
PREFILLED_SYRINGE | INTRAVENOUS | Status: AC
  Filled 2017-11-14: qty 10

## 2017-11-14 MED ORDER — ONDANSETRON HCL 4 MG/2ML IJ SOLN
INTRAMUSCULAR | Status: DC | PRN
  Administered 2017-11-14: 4 mg via INTRAVENOUS

## 2017-11-14 MED ORDER — BUPIVACAINE HCL (PF) 0.25 % IJ SOLN
INTRAMUSCULAR | Status: AC
  Filled 2017-11-14: qty 30

## 2017-11-14 MED ORDER — PHENYLEPHRINE 40 MCG/ML (10ML) SYRINGE FOR IV PUSH (FOR BLOOD PRESSURE SUPPORT)
PREFILLED_SYRINGE | INTRAVENOUS | Status: DC | PRN
  Administered 2017-11-14: 40 ug via INTRAVENOUS
  Administered 2017-11-14: 80 ug via INTRAVENOUS

## 2017-11-14 MED ORDER — CEFAZOLIN SODIUM-DEXTROSE 2-4 GM/100ML-% IV SOLN
2.0000 g | INTRAVENOUS | Status: AC
  Administered 2017-11-14: 2 g via INTRAVENOUS

## 2017-11-14 MED ORDER — CHLORHEXIDINE GLUCONATE CLOTH 2 % EX PADS: 6.0000 | MEDICATED_PAD | Freq: Once | CUTANEOUS | Status: DC

## 2017-11-14 MED ORDER — ONDANSETRON HCL 4 MG/2ML IJ SOLN
INTRAMUSCULAR | Status: AC
  Filled 2017-11-14: qty 2

## 2017-11-14 MED ORDER — ROCURONIUM BROMIDE 50 MG/5ML IV SOSY
PREFILLED_SYRINGE | INTRAVENOUS | Status: AC
  Filled 2017-11-14: qty 5

## 2017-11-14 MED ORDER — ROCURONIUM BROMIDE 10 MG/ML (PF) SYRINGE
PREFILLED_SYRINGE | INTRAVENOUS | Status: DC | PRN
  Administered 2017-11-14: 50 mg via INTRAVENOUS

## 2017-11-14 MED ORDER — LIDOCAINE 2% (20 MG/ML) 5 ML SYRINGE
INTRAMUSCULAR | Status: AC
  Filled 2017-11-14: qty 5

## 2017-11-14 MED ORDER — PROPOFOL 10 MG/ML IV BOLUS
INTRAVENOUS | Status: DC | PRN
  Administered 2017-11-14: 140 mg via INTRAVENOUS

## 2017-11-14 MED ORDER — HYDROMORPHONE HCL 1 MG/ML IJ SOLN: 0.2500 mg | INTRAMUSCULAR | Status: DC | PRN

## 2017-11-14 MED ORDER — ACETAMINOPHEN 500 MG PO TABS: 1000.0000 mg | ORAL_TABLET | Freq: Once | ORAL | Status: DC

## 2017-11-14 MED ORDER — ACETAMINOPHEN 500 MG PO TABS
ORAL_TABLET | ORAL | Status: AC
  Administered 2017-11-14: 1000 mg
  Filled 2017-11-14: qty 2

## 2017-11-14 MED ORDER — EPHEDRINE SULFATE-NACL 50-0.9 MG/10ML-% IV SOSY
PREFILLED_SYRINGE | INTRAVENOUS | Status: DC | PRN
  Administered 2017-11-14 (×3): 10 mg via INTRAVENOUS

## 2017-11-14 MED ORDER — DEXAMETHASONE SODIUM PHOSPHATE 10 MG/ML IJ SOLN
INTRAMUSCULAR | Status: DC | PRN
  Administered 2017-11-14: 5 mg via INTRAVENOUS

## 2017-11-14 SURGICAL SUPPLY — 56 items
ATTRACTOMAT 16X20 MAGNETIC DRP (DRAPES) IMPLANT
BLADE SURG 15 STRL LF DISP TIS (BLADE) ×1 IMPLANT
BLADE SURG 15 STRL SS (BLADE) ×2
CANISTER SUCT 3000ML PPV (MISCELLANEOUS) IMPLANT
CHLORAPREP W/TINT 26ML (MISCELLANEOUS) ×3 IMPLANT
CLIP VESOCCLUDE MED 6/CT (CLIP) ×3 IMPLANT
CLIP VESOCCLUDE SM WIDE 6/CT (CLIP) ×3 IMPLANT
CLOSURE WOUND 1/2 X4 (GAUZE/BANDAGES/DRESSINGS) ×1
CONT SPEC 4OZ CLIKSEAL STRL BL (MISCELLANEOUS) ×3 IMPLANT
COVER SURGICAL LIGHT HANDLE (MISCELLANEOUS) ×3 IMPLANT
CRADLE DONUT ADULT HEAD (MISCELLANEOUS) ×3 IMPLANT
DRAPE LAPAROTOMY 100X72 PEDS (DRAPES) ×3 IMPLANT
DRAPE UTILITY XL STRL (DRAPES) ×3 IMPLANT
ELECT CAUTERY BLADE 6.4 (BLADE) IMPLANT
ELECT REM PT RETURN 9FT ADLT (ELECTROSURGICAL) ×3
ELECTRODE REM PT RTRN 9FT ADLT (ELECTROSURGICAL) ×1 IMPLANT
GAUZE 4X4 16PLY RFD (DISPOSABLE) ×3 IMPLANT
GAUZE SPONGE 2X2 8PLY STRL LF (GAUZE/BANDAGES/DRESSINGS) ×1 IMPLANT
GLOVE BIO SURGEON STRL SZ7.5 (GLOVE) ×3 IMPLANT
GLOVE BIOGEL PI IND STRL 7.5 (GLOVE) ×1 IMPLANT
GLOVE BIOGEL PI INDICATOR 7.5 (GLOVE) ×2
GLOVE SURG ORTHO 8.0 STRL STRW (GLOVE) ×3 IMPLANT
GLOVE SURG SS PI 6.0 STRL IVOR (GLOVE) ×3 IMPLANT
GOWN STRL REUS W/ TWL LRG LVL3 (GOWN DISPOSABLE) ×2 IMPLANT
GOWN STRL REUS W/ TWL XL LVL3 (GOWN DISPOSABLE) ×1 IMPLANT
GOWN STRL REUS W/TWL LRG LVL3 (GOWN DISPOSABLE) ×6
GOWN STRL REUS W/TWL XL LVL3 (GOWN DISPOSABLE) ×3
HEMOSTAT ARISTA ABSORB 3G PWDR (MISCELLANEOUS) IMPLANT
HEMOSTAT SURGICEL 2X4 FIBR (HEMOSTASIS) ×3 IMPLANT
ILLUMINATOR WAVEGUIDE N/F (MISCELLANEOUS) IMPLANT
KIT BASIN OR (CUSTOM PROCEDURE TRAY) ×3 IMPLANT
KIT TURNOVER KIT B (KITS) ×3 IMPLANT
MANIFOLD NEPTUNE WASTE (CANNULA) ×3 IMPLANT
NEEDLE HYPO 25GX1X1/2 BEV (NEEDLE) ×3 IMPLANT
NS IRRIG 1000ML POUR BTL (IV SOLUTION) ×3 IMPLANT
PACK SURGICAL SETUP 50X90 (CUSTOM PROCEDURE TRAY) ×3 IMPLANT
PAD ARMBOARD 7.5X6 YLW CONV (MISCELLANEOUS) ×3 IMPLANT
PENCIL BUTTON HOLSTER BLD 10FT (ELECTRODE) IMPLANT
PENCIL SMOKE EVACUATOR (MISCELLANEOUS) ×3 IMPLANT
PREFILTER NEPTUNE (MISCELLANEOUS) ×3 IMPLANT
SPONGE GAUZE 2X2 STER 10/PKG (GAUZE/BANDAGES/DRESSINGS) ×2
SPONGE INTESTINAL PEANUT (DISPOSABLE) ×3 IMPLANT
STRIP CLOSURE SKIN 1/2X4 (GAUZE/BANDAGES/DRESSINGS) ×2 IMPLANT
SUT MNCRL AB 4-0 PS2 18 (SUTURE) ×3 IMPLANT
SUT SILK 2 0 (SUTURE)
SUT SILK 2-0 18XBRD TIE 12 (SUTURE) IMPLANT
SUT SILK 3 0 (SUTURE)
SUT SILK 3-0 18XBRD TIE 12 (SUTURE) IMPLANT
SUT VIC AB 3-0 SH 18 (SUTURE) ×3 IMPLANT
SYR BULB 3OZ (MISCELLANEOUS) ×3 IMPLANT
SYR CONTROL 10ML LL (SYRINGE) ×3 IMPLANT
TAPE CLOTH SURG 4X10 WHT LF (GAUZE/BANDAGES/DRESSINGS) ×3 IMPLANT
TOWEL OR 17X24 6PK STRL BLUE (TOWEL DISPOSABLE) IMPLANT
TOWEL OR 17X26 10 PK STRL BLUE (TOWEL DISPOSABLE) ×3 IMPLANT
TUBE CONNECTING 12'X1/4 (SUCTIONS) ×1
TUBE CONNECTING 12X1/4 (SUCTIONS) ×2 IMPLANT

## 2017-11-14 NOTE — Anesthesia Postprocedure Evaluation (Signed)
Anesthesia Post Note  Patient: [name] [name]  Procedure(s) Performed: LEFT INFERIOR PARATHYROIDECTOMY (Left Neck)     Patient location during evaluation: PACU Anesthesia Type: General Level of consciousness: awake and alert Pain management: pain level controlled Vital Signs Assessment: post-procedure vital signs reviewed and stable Respiratory status: spontaneous breathing, nonlabored ventilation, respiratory function stable and patient connected to nasal cannula oxygen Cardiovascular status: blood pressure returned to baseline and stable Postop Assessment: no apparent nausea or vomiting Anesthetic complications: no    Last Vitals:  Vitals:   11/14/17 0936 11/14/17 0950  BP:  (!) 166/90  Pulse: 81 78  Resp: 13 15  Temp: (!) 36.4 C   SpO2: 97% 100%    Last Pain:  Vitals:   11/14/17 0950  TempSrc:   PainSc: 0-No pain                 Barnet Glasgow

## 2017-11-14 NOTE — Transfer of Care (Signed)
Immediate Anesthesia Transfer of Care Note  Patient: [name] [name]  Procedure(s) Performed: LEFT INFERIOR PARATHYROIDECTOMY (Left Neck)  Patient Location: PACU  Anesthesia Type:General  Level of Consciousness: awake, alert  and oriented  Airway & Oxygen Therapy: Patient Spontanous Breathing and Patient connected to nasal cannula oxygen  Post-op Assessment: Report given to RN and Post -op Vital signs reviewed and stable  Post vital signs: Reviewed and stable  Last Vitals:  Vitals Value Taken Time  BP 164/88 11/14/2017  8:45 AM  Temp    Pulse 81 11/14/2017  8:48 AM  Resp 12 11/14/2017  8:48 AM  SpO2 100 % 11/14/2017  8:48 AM  Vitals shown include unvalidated device data.  Last Pain:  Vitals:   11/14/17 0647  TempSrc:   PainSc: 0-No pain         Complications: No apparent anesthesia complications

## 2017-11-14 NOTE — Anesthesia Procedure Notes (Signed)
Procedure Name: Intubation Date/Time: 11/14/2017 7:36 AM Performed by: Carver Fila, RN Pre-anesthesia Checklist: Patient identified, Emergency Drugs available, Suction available and Patient being monitored Patient Re-evaluated:Patient Re-evaluated prior to induction Oxygen Delivery Method: Circle System Utilized Preoxygenation: Pre-oxygenation with 100% oxygen Induction Type: IV induction Ventilation: Mask ventilation without difficulty Laryngoscope Size: Miller and 2 Grade View: Grade I Tube type: Oral Tube size: 7.5 mm Number of attempts: 1 Airway Equipment and Method: Stylet and Oral airway Placement Confirmation: ETT inserted through vocal cords under direct vision,  positive ETCO2 and breath sounds checked- equal and bilateral Secured at: 22 cm Tube secured with: Tape Dental Injury: Teeth and Oropharynx as per pre-operative assessment

## 2017-11-14 NOTE — Op Note (Signed)
OPERATIVE REPORT - PARATHYROIDECTOMY  Preoperative diagnosis: Primary hyperparathyroidism  Postop diagnosis: Same  Procedure: Left minimally invasive parathyroidectomy  Surgeon:  Armandina Gemma, MD  Anesthesia: General endotracheal  Estimated blood loss: Minimal  Preparation: ChloraPrep  Indications: Patient is referred by Dr. Renato Shin for surgical evaluation and management of primary hyperparathyroidism. Patient's primary care physician is Dr. Alysia Penna. Patient has a long-standing history of hyperparathyroidism. He has a nuclear medicine parathyroid scan in 2013 which localized a left inferior parathyroid adenoma. Patient has multiple complications including memory loss, fatigue, nephrolithiasis, and osteoporosis. Most recent laboratory studies show a calcium level of 12.0, and intact PTH level of 209, and a vitamin D level of 26.68. Ultrasound exam confirmed a left parathyroid adenoma located inferiorly.  Patient now comes for minimally invasive surgery.  Procedure: The patient was prepared in the pre-operative holding area. The patient was brought to the operating room and placed in a supine position on the operating room table. Following administration of general anesthesia, the patient was positioned and then prepped and draped in the usual strict aseptic fashion. After ascertaining that an adequate level of anesthesia been achieved, a neck incision was made with a #15 blade. Dissection was carried through subcutaneous tissues and platysma. Hemostasis was obtained with the electrocautery. Skin flaps were developed circumferentially and a Weitlander retractor was placed for exposure.  Strap muscles were incised in the midline. Strap muscles were reflected lateralley exposing the thyroid lobe. With gentle blunt dissection the thyroid lobe was mobilized.  Dissection was carried through adipose tissue and an enlarged parathyroid gland was identified. It was gently mobilized. Vascular  structures were divided between small ligaclips. Care was taken to avoid the recurrent laryngeal nerve and the esophagus. The parathyroid gland was completely excised. It was submitted to pathology where frozen section confirmed parathyroid tissue consistent with adenoma.  Neck was irrigated with warm saline and good hemostasis was noted. Fibrillar was placed in the operative field. Strap muscles were reapproximated in the midline with interrupted 3-0 Vicryl sutures. Platysma was closed with interrupted 3-0 Vicryl sutures. Skin was closed with a running 4-0 Monocryl subcuticular suture. Marcaine was infiltrated circumferentially. Wound was washed and dried and Steristrips were applied. Patient was awakened from anesthesia and brought to the recovery room. The patient tolerated the procedure well.   Armandina Gemma, MD [facility] Office: [phone]

## 2017-11-14 NOTE — Interval H&P Note (Signed)
History and Physical Interval Note:  11/14/2017 7:03 AM  [name] [name]  has presented today for surgery, with the diagnosis of primary hyperparathyroidism.  The various methods of treatment have been discussed with the patient and family. After consideration of risks, benefits and other options for treatment, the patient has consented to    Procedure(s): LEFT INFERIOR PARATHYROIDECTOMY (Left) as a surgical intervention .    The patient's history has been reviewed, patient examined, no change in status, stable for surgery.  I have reviewed the patient's chart and labs.  Questions were answered to the patient's satisfaction.    Armandina Gemma, [place] [facility] Office: [place]

## 2017-11-15 ENCOUNTER — Encounter (HOSPITAL_COMMUNITY): Payer: Self-pay | Admitting: Surgery

## 2017-11-18 ENCOUNTER — Observation Stay (HOSPITAL_COMMUNITY)
Admission: EM | Admit: 2017-11-18 | Discharge: 2017-11-19 | Disposition: A | Discharge status: Home / Self Care | Payer: Medicare Other | Attending: Internal Medicine | Admitting: Internal Medicine

## 2017-11-18 ENCOUNTER — Other Ambulatory Visit: Payer: Self-pay

## 2017-11-18 ENCOUNTER — Encounter (HOSPITAL_COMMUNITY): Payer: Self-pay | Admitting: Emergency Medicine

## 2017-11-18 ENCOUNTER — Emergency Department (HOSPITAL_COMMUNITY): Payer: Medicare Other

## 2017-11-18 DIAGNOSIS — N138 Other obstructive and reflux uropathy: Secondary | ICD-10-CM | POA: Diagnosis not present

## 2017-11-18 DIAGNOSIS — F329 Major depressive disorder, single episode, unspecified: Secondary | ICD-10-CM | POA: Diagnosis not present

## 2017-11-18 DIAGNOSIS — R531 Weakness: Principal | ICD-10-CM

## 2017-11-18 DIAGNOSIS — N401 Enlarged prostate with lower urinary tract symptoms: Secondary | ICD-10-CM | POA: Insufficient documentation

## 2017-11-18 DIAGNOSIS — Z743 Need for continuous supervision: Secondary | ICD-10-CM | POA: Diagnosis not present

## 2017-11-18 DIAGNOSIS — J45909 Unspecified asthma, uncomplicated: Secondary | ICD-10-CM | POA: Insufficient documentation

## 2017-11-18 DIAGNOSIS — Z886 Allergy status to analgesic agent status: Secondary | ICD-10-CM | POA: Diagnosis not present

## 2017-11-18 DIAGNOSIS — R5381 Other malaise: Secondary | ICD-10-CM | POA: Diagnosis not present

## 2017-11-18 DIAGNOSIS — E876 Hypokalemia: Secondary | ICD-10-CM | POA: Diagnosis not present

## 2017-11-18 DIAGNOSIS — Z87891 Personal history of nicotine dependence: Secondary | ICD-10-CM | POA: Insufficient documentation

## 2017-11-18 DIAGNOSIS — I452 Bifascicular block: Secondary | ICD-10-CM | POA: Diagnosis not present

## 2017-11-18 DIAGNOSIS — F419 Anxiety disorder, unspecified: Secondary | ICD-10-CM | POA: Diagnosis not present

## 2017-11-18 DIAGNOSIS — Z79899 Other long term (current) drug therapy: Secondary | ICD-10-CM | POA: Diagnosis not present

## 2017-11-18 DIAGNOSIS — Z9989 Dependence on other enabling machines and devices: Secondary | ICD-10-CM | POA: Insufficient documentation

## 2017-11-18 DIAGNOSIS — Z7982 Long term (current) use of aspirin: Secondary | ICD-10-CM | POA: Diagnosis not present

## 2017-11-18 DIAGNOSIS — E210 Primary hyperparathyroidism: Secondary | ICD-10-CM | POA: Diagnosis not present

## 2017-11-18 DIAGNOSIS — M1990 Unspecified osteoarthritis, unspecified site: Secondary | ICD-10-CM | POA: Insufficient documentation

## 2017-11-18 DIAGNOSIS — K219 Gastro-esophageal reflux disease without esophagitis: Secondary | ICD-10-CM | POA: Diagnosis not present

## 2017-11-18 DIAGNOSIS — G4733 Obstructive sleep apnea (adult) (pediatric): Secondary | ICD-10-CM | POA: Diagnosis not present

## 2017-11-18 DIAGNOSIS — E860 Dehydration: Secondary | ICD-10-CM | POA: Diagnosis not present

## 2017-11-18 DIAGNOSIS — I10 Essential (primary) hypertension: Secondary | ICD-10-CM | POA: Diagnosis present

## 2017-11-18 DIAGNOSIS — M810 Age-related osteoporosis without current pathological fracture: Secondary | ICD-10-CM | POA: Insufficient documentation

## 2017-11-18 DIAGNOSIS — Z96652 Presence of left artificial knee joint: Secondary | ICD-10-CM | POA: Insufficient documentation

## 2017-11-18 DIAGNOSIS — E785 Hyperlipidemia, unspecified: Secondary | ICD-10-CM | POA: Diagnosis not present

## 2017-11-18 LAB — CBC WITH DIFFERENTIAL/PLATELET
Abs Immature Granulocytes: 0.1 10*3/uL (ref 0.0–0.1)
Basophils Absolute: 0 10*3/uL (ref 0.0–0.1)
Basophils Relative: 0 %
EOS PCT: 0 %
Eosinophils Absolute: 0 10*3/uL (ref 0.0–0.7)
HEMATOCRIT: 39.7 % (ref 39.0–52.0)
Hemoglobin: 13.1 g/dL (ref 13.0–17.0)
IMMATURE GRANULOCYTES: 1 %
Lymphocytes Relative: 23 %
Lymphs Abs: 1.8 10*3/uL (ref 0.7–4.0)
MCH: 29.6 pg (ref 26.0–34.0)
MCHC: 33 g/dL (ref 30.0–36.0)
MCV: 89.8 fL (ref 78.0–100.0)
MONO ABS: 0.7 10*3/uL (ref 0.1–1.0)
MONOS PCT: 9 %
Neutro Abs: 5.2 10*3/uL (ref 1.7–7.7)
Neutrophils Relative %: 67 %
Platelets: 325 10*3/uL (ref 150–400)
RBC: 4.42 MIL/uL (ref 4.22–5.81)
RDW: 14.2 % (ref 11.5–15.5)
WBC: 7.7 10*3/uL (ref 4.0–10.5)

## 2017-11-18 LAB — COMPREHENSIVE METABOLIC PANEL
ALT: 29 U/L (ref 0–44)
AST: 30 U/L (ref 15–41)
Albumin: 3.7 g/dL (ref 3.5–5.0)
Alkaline Phosphatase: 57 U/L (ref 38–126)
Anion gap: 15 (ref 5–15)
BILIRUBIN TOTAL: 1.3 mg/dL — AB (ref 0.3–1.2)
BUN: 16 mg/dL (ref 8–23)
CO2: 20 mmol/L — ABNORMAL LOW (ref 22–32)
CREATININE: 1.59 mg/dL — AB (ref 0.61–1.24)
Calcium: 9.8 mg/dL (ref 8.9–10.3)
Chloride: 106 mmol/L (ref 98–111)
GFR, EST AFRICAN AMERICAN: 47 mL/min — AB (ref >60)
GFR, EST NON AFRICAN AMERICAN: 41 mL/min — AB (ref >60)
Glucose, Bld: 115 mg/dL — ABNORMAL HIGH (ref 70–99)
POTASSIUM: 3.5 mmol/L (ref 3.5–5.1)
Sodium: 141 mmol/L (ref 135–145)
TOTAL PROTEIN: 7 g/dL (ref 6.5–8.1)

## 2017-11-18 LAB — URINALYSIS, ROUTINE W REFLEX MICROSCOPIC
BILIRUBIN URINE: NEGATIVE
GLUCOSE, UA: NEGATIVE mg/dL
Hgb urine dipstick: NEGATIVE
Ketones, ur: 5 mg/dL — AB
Leukocytes, UA: NEGATIVE
NITRITE: NEGATIVE
PH: 9 — AB (ref 5.0–8.0)
Protein, ur: NEGATIVE mg/dL
Specific Gravity, Urine: 1.008 (ref 1.005–1.030)

## 2017-11-18 LAB — CBG MONITORING, ED: GLUCOSE-CAPILLARY: 116 mg/dL — AB (ref 70–99)

## 2017-11-18 LAB — TSH: TSH: 0.377 u[IU]/mL (ref 0.350–4.500)

## 2017-11-18 MED ORDER — SODIUM CHLORIDE 0.9 % IV SOLN
INTRAVENOUS | Status: DC
  Administered 2017-11-19: via INTRAVENOUS

## 2017-11-18 MED ORDER — ONDANSETRON HCL 4 MG/2ML IJ SOLN: 4.0000 mg | Freq: Four times a day (QID) | INTRAMUSCULAR | Status: DC | PRN

## 2017-11-18 MED ORDER — ACETAMINOPHEN 325 MG PO TABS: 650.0000 mg | ORAL_TABLET | Freq: Four times a day (QID) | ORAL | Status: DC | PRN

## 2017-11-18 MED ORDER — ONDANSETRON HCL 4 MG PO TABS: 4.0000 mg | ORAL_TABLET | Freq: Four times a day (QID) | ORAL | Status: DC | PRN

## 2017-11-18 MED ORDER — ACETAMINOPHEN 650 MG RE SUPP: 650.0000 mg | Freq: Four times a day (QID) | RECTAL | Status: DC | PRN

## 2017-11-18 MED ORDER — ENOXAPARIN SODIUM 40 MG/0.4ML ~~LOC~~ SOLN
40.0000 mg | SUBCUTANEOUS | Status: DC
  Administered 2017-11-19: 40 mg via SUBCUTANEOUS
  Filled 2017-11-18: qty 0.4

## 2017-11-18 MED ORDER — LACTATED RINGERS IV BOLUS
1000.0000 mL | Freq: Once | INTRAVENOUS | Status: AC
  Administered 2017-11-18: 1000 mL via INTRAVENOUS

## 2017-11-18 MED ORDER — ZOLPIDEM TARTRATE 5 MG PO TABS
5.0000 mg | ORAL_TABLET | Freq: Every evening | ORAL | Status: DC | PRN
  Administered 2017-11-19: 5 mg via ORAL
  Filled 2017-11-18: qty 1

## 2017-11-18 NOTE — H&P (Signed)
History and Physical    [name] [name] IRS:[id number] DOB: 09/24/1940 DOA: 11/18/2017  PCP: Laurey Morale, MD  Patient coming from: Home  I have personally briefly reviewed patient's old medical records in [place]  Chief Complaint: Generalized weakness  HPI: [name] [name] is a 77 y.o. male with medical history significant of Hyperparathyroidism, HTN, HLD.  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  Patient presents to the ED with generalized weakness, poor PO intake for past 2-3 days.  Symptoms are persistent.  Not really having much neck pain he states, just doesn't feel well.    ED Course: Creat 1.5 up from 1.2 baseline, TSH 0.377, calcium 9.8 down from 11.8 before surgery.  IVF and zofran in ED without much improvement in symptoms, pt and family request observation.   Review of Systems: As per HPI otherwise 10 point review of systems negative.   Past Medical History:  Diagnosis Date  . Allergy    sees Dr. Donneta Romberg   . Anemia   . Anxiety   . Arthritis   . Asthma   . Colon polyp   . Depression    sees Dr. Kimberlee Nearing in [place], [place]   . GERD (gastroesophageal reflux disease)   . Hyperlipidemia   . Hypertension   . Osteoporosis 01-2010  . RBBB (right bundle branch block)    stable  . Sleep apnea    does not use cpap    Past Surgical History:  Procedure Laterality Date  . COLONOSCOPY  03-14-14   per Dr. Earlean Shawl, polyps, repeat in 3 yrs  . ELBOW SURGERY     right  . PARATHYROIDECTOMY Left 11/14/2017   Procedure: LEFT INFERIOR PARATHYROIDECTOMY;  Surgeon: Armandina Gemma, MD;  Location: [facility];  Service: General;  Laterality: Left;  . TOTAL KNEE ARTHROPLASTY Left 12/23/2014   Procedure: TOTAL KNEE ARTHROPLASTY;  Surgeon: Frederik Pear, MD;  Location: [facility];  Service: Orthopedics;  Laterality: Left;     reports that he quit smoking about 31 years ago. His smoking use included cigarettes. He has a 27.00 pack-year smoking  history. He has never used smokeless tobacco. He reports that he drinks alcohol. He reports that he does not use drugs.  No Known Allergies  Family History  Problem Relation Age of Onset  . Cancer Mother        stomach  . Hypercalcemia Neg Hx      Prior to Admission medications   Medication Sig Start Date End Date Taking? Authorizing Provider  acetaminophen (TYLENOL) 500 MG tablet Take 500 mg by mouth daily as needed for moderate pain or headache.    [provider]  alendronate (FOSAMAX) 70 MG tablet TAKE 1 TABLET BY MOUTH EVERY 7 DAYS. TAKE WITH FULL GLASS OF WATER ON AN EMPTY STOMACH 03/02/17   Laurey Morale, MD  amLODipine (NORVASC) 10 MG tablet TAKE 1 TABLET (10 MG TOTAL) BY MOUTH DAILY. 03/02/17   Laurey Morale, MD  ASPERCREME LIDOCAINE EX Apply 1 application topically daily as needed (muscle pain).    [provider]  aspirin 81 MG tablet Take 81 mg by mouth every evening.     [provider]  azelastine (ASTELIN) 0.1 % nasal spray Place 1 spray into both nostrils 2 (two) times daily.  03/29/17   [provider]  benazepril (LOTENSIN) 10 MG tablet Take 1 tablet (10 mg total) by mouth daily. 03/02/17   Laurey Morale, MD  Carboxymethylcellul-Glycerin (LUBRICATING  EYE DROPS OP) Place 1 drop into both eyes 2 (two) times daily.    [provider]  cetirizine (ZYRTEC) 10 MG tablet Take 10 mg by mouth daily.     [provider]  cholecalciferol (VITAMIN D) 1000 units tablet Take 1 tablet (1,000 Units total) by mouth daily. 10/25/17   Laurey Morale, MD  EPINEPHrine 0.3 mg/0.3 mL IJ SOAJ injection Inject 0.3 mg into the muscle once.  08/17/17   [provider]  ferrous sulfate 325 (65 FE) MG tablet TAKE 1 TABLET BY MOUTH 2 TIMES DAILY WITH A MEAL. Patient taking differently: Take 325 mg by mouth 2 (two) times daily.  03/21/17   Laurey Morale, MD  Ketotifen Fumarate (ALLERGY EYE DROPS OP) Place 1 drop into both eyes daily.     [provider]  LORazepam (ATIVAN) 0.5 MG tablet Take 1 tablet (0.5 mg total) by mouth 2 (two) times daily as needed for anxiety. Patient taking differently: Take 0.5 mg by mouth 2 (two) times daily.  10/10/17   Laurey Morale, MD  metoprolol tartrate (LOPRESSOR) 50 MG tablet Take 1 tablet (50 mg total) by mouth 2 (two) times daily. 03/02/17   Laurey Morale, MD  montelukast (SINGULAIR) 10 MG tablet Take 1 tablet (10 mg total) by mouth every evening. 03/02/17   Laurey Morale, MD  Mouthwashes (BIOTENE DRY MOUTH MT) Use as directed 1 Dose in the mouth or throat daily as needed (dry mouth).    [provider]  Multiple Vitamin (MULTIVITAMIN) tablet Take 1 tablet by mouth every evening.     [provider]  Omega-3 Fatty Acids (FISH OIL) 1200 MG CAPS Take 1,200 mg by mouth daily.    [provider]  omeprazole (PRILOSEC) 20 MG capsule Take 1 capsule (20 mg total) by mouth daily. 03/02/17   Laurey Morale, MD  polyethylene glycol (MIRALAX / Floria Raveling) packet Take 1 teaspoonful once daily    [provider]  sertraline (ZOLOFT) 50 MG tablet Take 1 tablet (50 mg total) by mouth daily. 10/10/17   Laurey Morale, MD  simvastatin (ZOCOR) 20 MG tablet TAKE 1 TABLET (20 MG TOTAL) BY MOUTH DAILY. 03/02/17   Laurey Morale, MD  solifenacin (VESICARE) 5 MG tablet Take 1 tablet (5 mg total) by mouth daily. 09/14/17   Laurey Morale, MD  traMADol (ULTRAM) 50 MG tablet Take 1-2 tablets (50-100 mg total) by mouth every 6 (six) hours as needed for moderate pain. 11/14/17   Armandina Gemma, MD  vitamin B-12 (CYANOCOBALAMIN) 500 MCG tablet Take 500 mcg by mouth daily.    [provider]  amLODipine-benazepril (LOTREL) 10-20 MG per capsule Take 1 capsule by mouth daily. 12/21/10 05/04/11  Laurey Morale, MD    Physical Exam: Vitals:   11/18/17 1800 11/18/17 1830 11/18/17 1948 11/18/17 2242  BP: (!) 171/93 (!) 176/96 (!) 183/84   Pulse: 94 93 (!) 103 73  Resp: 19 15 18 11     Temp:      TempSrc:      SpO2: 99% 100% 100% 99%  Weight:      Height:        Constitutional: NAD, calm, comfortable Eyes: PERRL, lids and conjunctivae normal ENMT: Mucous membranes are moist. Posterior pharynx clear of any exudate or lesions.Normal dentition.  Neck: normal, supple, no masses, no thyromegaly Respiratory: clear to auscultation bilaterally, no wheezing, no crackles. Normal respiratory effort. No accessory muscle use.  Cardiovascular: Regular rate  and rhythm, no murmurs / rubs / gallops. No extremity edema. 2+ pedal pulses. No carotid bruits.  Abdomen: no tenderness, no masses palpated. No hepatosplenomegaly. Bowel sounds positive.  Musculoskeletal: no clubbing / cyanosis. No joint deformity upper and lower extremities. Good ROM, no contractures. Normal muscle tone.  Skin: no rashes, lesions, ulcers. No induration Neurologic: CN 2-12 grossly intact. Sensation intact, DTR normal. Strength 5/5 in all 4.  Psychiatric: Normal judgment and insight. Alert and oriented x 3. Normal mood.    Labs on Admission: I have personally reviewed following labs and imaging studies  CBC: Recent Labs  Lab 11/18/17 1650  WBC 7.7  NEUTROABS 5.2  HGB 13.1  HCT 39.7  MCV 89.8  PLT 637   Basic Metabolic Panel: Recent Labs  Lab 11/18/17 1650  NA 141  K 3.5  CL 106  CO2 20*  GLUCOSE 115*  BUN 16  CREATININE 1.59*  CALCIUM 9.8   GFR: Estimated Creatinine Clearance: 47.7 mL/min (A) (by C-G formula based on SCr of 1.59 mg/dL (H)). Liver Function Tests: Recent Labs  Lab 11/18/17 1650  AST 30  ALT 29  ALKPHOS 57  BILITOT 1.3*  PROT 7.0  ALBUMIN 3.7   No results for input(s): LIPASE, AMYLASE in the last 168 hours. No results for input(s): AMMONIA in the last 168 hours. Coagulation Profile: No results for input(s): INR, PROTIME in the last 168 hours. Cardiac Enzymes: No results for input(s): CKTOTAL, CKMB, CKMBINDEX, TROPONINI in the last 168 hours. BNP (last 3  results) No results for input(s): PROBNP in the last 8760 hours. HbA1C: No results for input(s): HGBA1C in the last 72 hours. CBG: Recent Labs  Lab 11/18/17 1717  GLUCAP 116*   Lipid Profile: No results for input(s): CHOL, HDL, LDLCALC, TRIG, CHOLHDL, LDLDIRECT in the last 72 hours. Thyroid Function Tests: Recent Labs    11/18/17 1650  TSH 0.377   Anemia Panel: No results for input(s): VITAMINB12, FOLATE, FERRITIN, TIBC, IRON, RETICCTPCT in the last 72 hours. Urine analysis:    Component Value Date/Time   COLORURINE YELLOW 11/18/2017 1650   APPEARANCEUR CLEAR 11/18/2017 1650   LABSPEC 1.008 11/18/2017 1650   PHURINE 9.0 (H) 11/18/2017 1650   GLUCOSEU NEGATIVE 11/18/2017 1650   HGBUR NEGATIVE 11/18/2017 1650   HGBUR negative 12/09/2009 0911   BILIRUBINUR NEGATIVE 11/18/2017 1650   BILIRUBINUR n 03/02/2017 1242   KETONESUR 5 (A) 11/18/2017 1650   PROTEINUR NEGATIVE 11/18/2017 1650   UROBILINOGEN 0.2 03/02/2017 1242   UROBILINOGEN 0.2 12/13/2014 1027   NITRITE NEGATIVE 11/18/2017 1650   LEUKOCYTESUR NEGATIVE 11/18/2017 1650    Radiological Exams on Admission: Dg Chest 2 View  Result Date: 11/18/2017 CLINICAL DATA:  Malaise and generalized weakness for 2 days. EXAM: CHEST - 2 VIEW COMPARISON:  PA and lateral chest 06/03/2014 and 11/09/2017. FINDINGS: The lungs are clear. Heart size is normal. No pneumothorax or pleural fluid. No acute or focal bony abnormality. IMPRESSION: Negative chest. Electronically Signed   By: Inge Rise M.D.   On: 11/18/2017 17:50    EKG: Independently reviewed.  Assessment/Plan Principal Problem:   Generalized weakness Active Problems:   Hyperparathyroidism, primary ([place])   Essential hypertension    1. Generalized weakness - 1. Continue IVF 2. Zofran PRN 3. Checking PO4, FT4, Vit D levels 2. Hyperparathyroidism - s/p parathyroidectomy a few days ago 3. HTN - continue home BP meds  DVT prophylaxis: Lovenox Code Status:  Full Family Communication: Wife at bedside Disposition Plan: Home after admit Consults  called: None Admission status: Place in obs   [place], [facility] Pager [phone] Only works nights!  If 7AM-7PM, please contact the primary day team physician taking care of patient  [email or web address] Password [email or web address]  11/18/2017, 11:06 PM

## 2017-11-18 NOTE — ED Notes (Signed)
CBG 116  

## 2017-11-18 NOTE — ED Notes (Signed)
Pt CBG 116. RN notified.

## 2017-11-18 NOTE — ED Provider Notes (Signed)
[facility]PART[facility] Provider Note   CSN: [id number] Arrival date & time: 11/18/17  1452     History   Chief Complaint Chief Complaint  Patient presents with  . Weakness    HPI [name] [name] is a 77 y.o. male.  HPI  Patient is a 77 year old male with PMHx of GERD, HTN, OSA on CPAP, and primary hyperparathyrioidism s/p resection who presents with generalized weakness since the surgery on Monday (5 days).  He c/o associated decreased appetite however denies fever, cough, CP, SOB, abdominal pain, N/V/D, urinary sxs and all other complaints. No real pain, just "doesn't feel well."  No tx PTA.  No alleviating or aggravating factors.  Patient states incision site is healing well.  Past Medical History:  Diagnosis Date  . Allergy    sees Dr. Donneta Romberg   . Anemia   . Anxiety   . Arthritis   . Asthma   . Colon polyp   . Depression    sees Dr. Kimberlee Nearing in [place], [place]   . GERD (gastroesophageal reflux disease)   . Hyperlipidemia   . Hypertension   . Osteoporosis 01-2010  . RBBB (right bundle branch block)    stable  . Sleep apnea    does not use cpap    Patient Active Problem List   Diagnosis Date Noted  . Generalized weakness 11/18/2017  . Depression with anxiety 10/10/2017  . Arthritis of knee 12/23/2014  . Primary osteoarthritis of left knee 12/21/2014  . OSA (obstructive sleep apnea) 05/27/2011  . Hyperparathyroidism, primary ([place]) 12/25/2009  . KNEE PAIN, LEFT 12/25/2009  . PLANTAR FASCIITIS 12/16/2009  . ACUTE PROSTATITIS 01/14/2009  . ANEMIA-IRON DEFICIENCY 01/09/2008  . ACUTE SINUSITIS, UNSPECIFIED 07/31/2007  . Dyslipidemia 12/16/2006  . ANXIETY 12/16/2006  . ERECTILE DYSFUNCTION 12/16/2006  . Essential hypertension 12/16/2006  . ALLERGIC RHINITIS 12/16/2006  . ASTHMA 12/16/2006  . GERD 12/16/2006  . COLONIC POLYPS, HX OF 12/16/2006  . 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 PARONYCHIA, FINGER 10/11/2006    Past  Surgical History:  Procedure Laterality Date  . COLONOSCOPY  03-14-14   per Dr. Earlean Shawl, polyps, repeat in 3 yrs  . ELBOW SURGERY     right  . PARATHYROIDECTOMY Left 11/14/2017   Procedure: LEFT INFERIOR PARATHYROIDECTOMY;  Surgeon: Armandina Gemma, MD;  Location: [facility];  Service: General;  Laterality: Left;  . TOTAL KNEE ARTHROPLASTY Left 12/23/2014   Procedure: TOTAL KNEE ARTHROPLASTY;  Surgeon: Frederik Pear, MD;  Location: [facility];  Service: Orthopedics;  Laterality: Left;        Home Medications    Prior to Admission medications   Medication Sig Start Date End Date Taking? Authorizing Provider  acetaminophen (TYLENOL) 500 MG tablet Take 500 mg by mouth daily as needed for moderate pain or headache.   Yes [provider]  alendronate (FOSAMAX) 70 MG tablet TAKE 1 TABLET BY MOUTH EVERY 7 DAYS. TAKE WITH FULL GLASS OF WATER ON AN EMPTY STOMACH Patient taking differently: Take 70 mg by mouth once a week. On Saturday 03/02/17  Yes Laurey Morale, MD  amLODipine (NORVASC) 10 MG tablet TAKE 1 TABLET (10 MG TOTAL) BY MOUTH DAILY. Patient taking differently: Take 10 mg by mouth daily.  03/02/17  Yes Laurey Morale, MD  ASPERCREME LIDOCAINE EX Apply 1 application topically daily as needed (muscle pain).   Yes [provider]  aspirin 81 MG tablet Take 81 mg by mouth every evening.    Yes [provider]  azelastine (ASTELIN) 0.1 % nasal spray Place 1 spray into both nostrils 2 (two) times daily.  03/29/17  Yes [provider]  benazepril (LOTENSIN) 10 MG tablet Take 1 tablet (10 mg total) by mouth daily. 03/02/17  Yes Laurey Morale, MD  Carboxymethylcellul-Glycerin (LUBRICATING EYE DROPS OP) Place 1 drop into both eyes 2 (two) times daily.   Yes [provider]  cetirizine (ZYRTEC) 10 MG tablet Take 10 mg by mouth daily.    Yes [provider]  cholecalciferol (VITAMIN D) 1000 units tablet Take 1 tablet (1,000 Units total) by mouth daily. 10/25/17  Yes  Laurey Morale, MD  EPINEPHrine 0.3 mg/0.3 mL IJ SOAJ injection Inject 0.3 mg into the muscle as needed (for allergic reaction).  08/17/17  Yes [provider]  ferrous sulfate 325 (65 FE) MG tablet TAKE 1 TABLET BY MOUTH 2 TIMES DAILY WITH A MEAL. Patient taking differently: Take 325 mg by mouth 2 (two) times daily.  03/21/17  Yes Laurey Morale, MD  Ketotifen Fumarate (ALLERGY EYE DROPS OP) Place 1 drop into both eyes daily.   Yes [provider]  LORazepam (ATIVAN) 0.5 MG tablet Take 1 tablet (0.5 mg total) by mouth 2 (two) times daily as needed for anxiety. Patient taking differently: Take 0.5 mg by mouth 2 (two) times daily.  10/10/17  Yes Laurey Morale, MD  metoprolol tartrate (LOPRESSOR) 50 MG tablet Take 1 tablet (50 mg total) by mouth 2 (two) times daily. 03/02/17  Yes Laurey Morale, MD  montelukast (SINGULAIR) 10 MG tablet Take 1 tablet (10 mg total) by mouth every evening. 03/02/17  Yes Laurey Morale, MD  Mouthwashes (BIOTENE DRY MOUTH MT) Use as directed 1 Dose in the mouth or throat daily as needed (dry mouth).   Yes [provider]  Multiple Vitamin (MULTIVITAMIN) tablet Take 1 tablet by mouth every evening.    Yes [provider]  Omega-3 Fatty Acids (FISH OIL) 1200 MG CAPS Take 1,200 mg by mouth daily.   Yes [provider]  omeprazole (PRILOSEC) 20 MG capsule Take 1 capsule (20 mg total) by mouth daily. 03/02/17  Yes Laurey Morale, MD  polyethylene glycol (MIRALAX / Floria Raveling) packet Take 1 teaspoonful once daily   Yes [provider]  sertraline (ZOLOFT) 50 MG tablet Take 1 tablet (50 mg total) by mouth daily. 10/10/17  Yes Laurey Morale, MD  simvastatin (ZOCOR) 20 MG tablet TAKE 1 TABLET (20 MG TOTAL) BY MOUTH DAILY. Patient taking differently: Take 20 mg by mouth daily at 6 PM.  03/02/17  Yes Laurey Morale, MD  solifenacin (VESICARE) 5 MG tablet Take 1 tablet (5 mg total) by mouth daily. 09/14/17  Yes Laurey Morale, MD  traMADol  (ULTRAM) 50 MG tablet Take 1-2 tablets (50-100 mg total) by mouth every 6 (six) hours as needed for moderate pain. 11/14/17  Yes GerkinSherren Mocha, MD  vitamin B-12 (CYANOCOBALAMIN) 500 MCG tablet Take 500 mcg by mouth daily.   Yes [provider]  potassium chloride SA (K-DUR,KLOR-CON) 20 MEQ tablet Take 2 tablets (40 mEq total) by mouth 2 (two) times daily. 11/19/17   Lady Deutscher, MD  amLODipine-benazepril (LOTREL) 10-20 MG per capsule Take 1 capsule by mouth daily. 12/21/10 05/04/11  Laurey Morale, MD    Family History Family History  Problem Relation Age of Onset  . Cancer Mother        stomach  . Hypercalcemia Neg  Hx     Social History Social History   Tobacco Use  . Smoking status: Former Smoker    Packs/day: 1.00    Years: 27.00    Pack years: 27.00    Types: Cigarettes    Last attempt to quit: 02/15/1986    Years since quitting: 31.7  . Smokeless tobacco: Never Used  Substance Use Topics  . Alcohol use: Yes    Alcohol/week: 0.0 - 3.0 standard drinks    Comment: Occ  . Drug use: No     Allergies   Patient has no known allergies.   Review of Systems Review of Systems  Constitutional: Positive for activity change, appetite change and fatigue. Negative for chills and fever.  HENT: Negative for sore throat and trouble swallowing.   Eyes: Negative for pain and visual disturbance.  Respiratory: Negative for cough and shortness of breath.   Cardiovascular: Negative for chest pain and palpitations.  Gastrointestinal: Positive for nausea. Negative for abdominal pain, diarrhea and vomiting.  Genitourinary: Negative for dysuria and hematuria.  Musculoskeletal: Negative for arthralgias and back pain.  Skin: Negative for color change and rash.  Neurological: Negative for seizures, syncope and headaches.  All other systems reviewed and are negative.    Physical Exam Updated Vital Signs BP (!) 182/95 (BP Location: Left Arm)   Pulse 70   Temp 97.9 F (36.6 C)  (Oral)   Resp 16   Ht 6' (1.829 m)   Wt 97 kg   SpO2 97%   BMI 29.00 kg/m   Physical Exam  Constitutional: He is oriented to person, place, and time. He appears well-developed and well-nourished. No distress.  Comfortable.  HENT:  Head: Normocephalic and atraumatic.  Mouth/Throat: Oropharynx is clear and moist. No oropharyngeal exudate.  Uvula midline. Tolerating oral secretions. No stridor.    Eyes: Pupils are equal, round, and reactive to light. Conjunctivae and EOM are normal.  Neck: Normal range of motion. Neck supple.  Incision site c/d/i  Cardiovascular: Normal rate, regular rhythm and intact distal pulses.  No murmur heard. Pulmonary/Chest: Effort normal and breath sounds normal. No respiratory distress. He has no wheezes. He has no rales.  Abdominal: Soft. He exhibits no distension. There is no tenderness. There is no guarding.  Musculoskeletal: Normal range of motion. He exhibits no edema.  Neurological: He is alert and oriented to person, place, and time. He has normal strength. No cranial nerve deficit or sensory deficit. Coordination normal. GCS eye subscore is 4. GCS verbal subscore is 5. GCS motor subscore is 6.  Skin: Skin is warm and dry. Capillary refill takes less than 2 seconds. No pallor.  Psychiatric: He has a normal mood and affect.  Nursing note and vitals reviewed.    ED Treatments / Results  Labs (all labs ordered are listed, but only abnormal results are displayed) Labs Reviewed  COMPREHENSIVE METABOLIC PANEL - Abnormal; Notable for the following components:      Result Value   CO2 20 (*)    Glucose, Bld 115 (*)    Creatinine, Ser 1.59 (*)    Total Bilirubin 1.3 (*)    GFR calc non Af Amer 41 (*)    GFR calc Af Amer 47 (*)    All other components within normal limits  URINALYSIS, ROUTINE W REFLEX MICROSCOPIC - Abnormal; Notable for the following components:   pH 9.0 (*)    Ketones, ur 5 (*)    All other components within normal limits  BASIC  METABOLIC  PANEL - Abnormal; Notable for the following components:   Potassium 3.3 (*)    Glucose, Bld 130 (*)    GFR calc non Af Amer 56 (*)    All other components within normal limits  CBG MONITORING, ED - Abnormal; Notable for the following components:   Glucose-Capillary 116 (*)    All other components within normal limits  URINE CULTURE  CBC WITH DIFFERENTIAL/PLATELET  TSH  T4, FREE  PHOSPHORUS  PARATHYROID HORMONE, INTACT (NO CA)  VITAMIN D 25 HYDROXY (VIT D DEFICIENCY, FRACTURES)    EKG EKG Interpretation  Date/Time:  Friday November 18 2017 17:10:36 EDT Ventricular Rate:  98 PR Interval:    QRS Duration: 147 QT Interval:  415 QTC Calculation: 530 R Axis:   -41 Text Interpretation:  Sinus rhythm RBBB and LAFB Since last tracing rate faster Confirmed by Orlie Dakin [phone]) on 11/18/2017 5:23:24 PM   Radiology Dg Chest 2 View  Result Date: 11/18/2017 CLINICAL DATA:  Malaise and generalized weakness for 2 days. EXAM: CHEST - 2 VIEW COMPARISON:  PA and lateral chest 06/03/2014 and 11/09/2017. FINDINGS: The lungs are clear. Heart size is normal. No pneumothorax or pleural fluid. No acute or focal bony abnormality. IMPRESSION: Negative chest. Electronically Signed   By: Inge Rise M.D.   On: 11/18/2017 17:50    Procedures Procedures (including critical care time)  Medications Ordered in ED Medications  0.9 %  sodium chloride infusion ( Intravenous New Bag/Given 11/19/17 0022)  zolpidem (AMBIEN) tablet 5 mg (5 mg Oral Given 11/19/17 0032)  acetaminophen (TYLENOL) tablet 650 mg (has no administration in time range)    Or  acetaminophen (TYLENOL) suppository 650 mg (has no administration in time range)  ondansetron (ZOFRAN) tablet 4 mg (has no administration in time range)    Or  ondansetron (ZOFRAN) injection 4 mg (has no administration in time range)  enoxaparin (LOVENOX) injection 40 mg (40 mg Subcutaneous Given 11/19/17 0554)  amLODipine (NORVASC) tablet 10 mg  (10 mg Oral Given 11/19/17 0922)  aspirin chewable tablet 81 mg (has no administration in time range)  azelastine (ASTELIN) 0.1 % nasal spray 1 spray (1 spray Each Nare Given 11/19/17 0930)  benazepril (LOTENSIN) tablet 10 mg (10 mg Oral Given 11/19/17 0922)  loratadine (CLARITIN) tablet 10 mg (10 mg Oral Given 11/19/17 0923)  cholecalciferol (VITAMIN D) tablet 1,000 Units (1,000 Units Oral Given 11/19/17 0922)  ferrous sulfate tablet 325 mg (325 mg Oral Not Given 11/19/17 0934)  ketotifen (ZADITOR) 0.025 % ophthalmic solution 1 drop (1 drop Both Eyes Given 11/19/17 0927)  metoprolol tartrate (LOPRESSOR) tablet 50 mg (50 mg Oral Given 11/19/17 0927)  montelukast (SINGULAIR) tablet 10 mg (has no administration in time range)  antiseptic oral rinse (BIOTENE) solution (has no administration in time range)  multivitamin with minerals tablet 1 tablet (has no administration in time range)  pantoprazole (PROTONIX) EC tablet 40 mg (40 mg Oral Given 11/19/17 0923)  polyethylene glycol (MIRALAX / GLYCOLAX) packet 17 g (17 g Oral Given 11/19/17 0921)  LORazepam (ATIVAN) tablet 0.5 mg (has no administration in time range)  sertraline (ZOLOFT) tablet 50 mg (50 mg Oral Given 11/19/17 0923)  simvastatin (ZOCOR) tablet 20 mg (has no administration in time range)  traMADol (ULTRAM) tablet 50-100 mg (has no administration in time range)  vitamin B-12 (CYANOCOBALAMIN) tablet 500 mcg (500 mcg Oral Given 11/19/17 0923)  polyvinyl alcohol (LIQUIFILM TEARS) 1.4 % ophthalmic solution 2 drop (2 drops Both Eyes Given 11/19/17 0924)  omega-3  acid ethyl esters (LOVAZA) capsule 1 g (1 g Oral Given 11/19/17 0928)  potassium chloride SA (K-DUR,KLOR-CON) CR tablet 40 mEq (40 mEq Oral Given 11/19/17 0925)  lactated ringers bolus 1,000 mL (0 mLs Intravenous Stopped 11/18/17 1935)     Initial Impression / Assessment and Plan / ED Course  I have reviewed the triage vital signs and the nursing notes.  Pertinent labs & imaging results that  were available during my care of the patient were reviewed by me and considered in my medical decision making (see chart for details).    Patient is a 77 year old male with PMHx of GERD, HTN, OSA on CPAP, and primary hyperparathyrioidism s/p resection who presents with generalized weakness since the surgery on Monday (5 days).   No pain. No fever.  On arrival HDS and overall well appearing.  Exam as above unremarkable.  Will get basic labs and give IVF.  No clear etiology of generalized weakness as no toxic, metabolic, or infectious process identified.  Labs unremarkable including CBC, CMP, and TSH.  UA without infection.  CXR without effusion or focal consolidation to suggest PNA. Incision site appears well healing without overlying erythema or discharge.  Doubt acute intracranial process as patient with nonfocal neuro exam, no HA, and no fever.  Etiology likely 2/2 dehydration vs deconditioning from surgical procedure.  Patient given IVF and zofran with mild improvement.  Attempted to discharge patient however he and wife no longer feel comfortable going home and are requesting admission for observation.  Discussed case with hospitalist who will admit. HDS at transfer.  Final Clinical Impressions(s) / ED Diagnoses   Final diagnoses:  Weakness  Dehydration    ED Discharge Orders         Ordered    potassium chloride SA (K-DUR,KLOR-CON) 20 MEQ tablet  2 times daily,   Status:  Discontinued     11/19/17 1137    Increase activity slowly     11/19/17 1137    Diet - low sodium heart healthy     11/19/17 1137    Discharge instructions    Comments:  Speak to [facility] about ensuring that you have enough supplies for your CPAP machine.  Ask them to reeducate you about how to clean it properly.  Follow their directions about cleaning the CPAP machine eating and machine.  Use your CPAP machine every night as directed. Make and keep a follow-up appointment with Dr. Sarajane Jews   11/19/17  1137    potassium chloride SA (K-DUR,KLOR-CON) 20 MEQ tablet  2 times daily     11/19/17 1214           Fabian November, MD 11/19/17 [place], MD 11/21/17 1139

## 2017-11-18 NOTE — ED Provider Notes (Signed)
Planes of generalized weakness for the past 2 or 3 days.  Patient had recent parathyroidectomy 10/2017.  He denies fever.  Denies pain anywhere.  No other associated symptoms.  On exam alert Glasgow Coma Score 15 lungs clear to auscultation heart regular rate and rhythm HEENT exam no facial asymmetry neck well-healing surgical wound at anterior neck.  No surrounding soft tissue swelling or redness no tenderness.   Orlie Dakin, MD 11/18/17 [phone]

## 2017-11-18 NOTE — ED Notes (Signed)
Patient transported to X-ray 

## 2017-11-18 NOTE — ED Triage Notes (Signed)
Per [facility] pt coming from home c/o malaise and generalized weakness x 2 days. Pt also reports increased depression feeling. Pt had parathyroid surgery on the 30th of last month. Pt states he has had decreased appetite but forcing himself to eat and drink. Reports "little" bowel movements.

## 2017-11-19 DIAGNOSIS — R531 Weakness: Secondary | ICD-10-CM | POA: Diagnosis not present

## 2017-11-19 LAB — BASIC METABOLIC PANEL
Anion gap: 14 (ref 5–15)
BUN: 12 mg/dL (ref 8–23)
CALCIUM: 9.5 mg/dL (ref 8.9–10.3)
CO2: 24 mmol/L (ref 22–32)
CREATININE: 1.21 mg/dL (ref 0.61–1.24)
Chloride: 102 mmol/L (ref 98–111)
GFR calc Af Amer: >60 mL/min (ref >60)
GFR calc non Af Amer: 56 mL/min — ABNORMAL LOW (ref >60)
GLUCOSE: 130 mg/dL — AB (ref 70–99)
Potassium: 3.3 mmol/L — ABNORMAL LOW (ref 3.5–5.1)
Sodium: 140 mmol/L (ref 135–145)

## 2017-11-19 LAB — URINE CULTURE: Culture: NO GROWTH

## 2017-11-19 LAB — T4, FREE: FREE T4: 1.05 ng/dL (ref 0.82–1.77)

## 2017-11-19 LAB — PHOSPHORUS: Phosphorus: 3 mg/dL (ref 2.5–4.6)

## 2017-11-19 MED ORDER — ALENDRONATE SODIUM 70 MG PO TABS: 70.0000 mg | ORAL_TABLET | ORAL | Status: DC

## 2017-11-19 MED ORDER — KETOTIFEN FUMARATE 0.025 % OP SOLN
1.0000 [drp] | Freq: Two times a day (BID) | OPHTHALMIC | Status: DC
  Administered 2017-11-19: 1 [drp] via OPHTHALMIC
  Filled 2017-11-19: qty 5

## 2017-11-19 MED ORDER — PANTOPRAZOLE SODIUM 40 MG PO TBEC
40.0000 mg | DELAYED_RELEASE_TABLET | Freq: Every day | ORAL | Status: DC
  Administered 2017-11-19: 40 mg via ORAL
  Filled 2017-11-19: qty 1

## 2017-11-19 MED ORDER — METOPROLOL TARTRATE 50 MG PO TABS
50.0000 mg | ORAL_TABLET | Freq: Two times a day (BID) | ORAL | Status: DC
  Administered 2017-11-19: 50 mg via ORAL
  Filled 2017-11-19: qty 1

## 2017-11-19 MED ORDER — SIMVASTATIN 20 MG PO TABS: 20.0000 mg | ORAL_TABLET | Freq: Every day | ORAL | Status: DC

## 2017-11-19 MED ORDER — BIOTENE DRY MOUTH MT LIQD: OROMUCOSAL | Status: DC | PRN

## 2017-11-19 MED ORDER — POTASSIUM CHLORIDE CRYS ER 20 MEQ PO TBCR: 40.0000 meq | EXTENDED_RELEASE_TABLET | Freq: Two times a day (BID) | ORAL | 0 refills | Status: AC

## 2017-11-19 MED ORDER — AMLODIPINE BESYLATE 10 MG PO TABS
10.0000 mg | ORAL_TABLET | Freq: Every day | ORAL | Status: DC
  Administered 2017-11-19: 10 mg via ORAL
  Filled 2017-11-19: qty 1

## 2017-11-19 MED ORDER — POTASSIUM CHLORIDE CRYS ER 20 MEQ PO TBCR
40.0000 meq | EXTENDED_RELEASE_TABLET | Freq: Two times a day (BID) | ORAL | Status: DC
  Administered 2017-11-19: 40 meq via ORAL
  Filled 2017-11-19: qty 2

## 2017-11-19 MED ORDER — ADULT MULTIVITAMIN W/MINERALS CH: 1.0000 | ORAL_TABLET | Freq: Every evening | ORAL | Status: DC

## 2017-11-19 MED ORDER — LORATADINE 10 MG PO TABS
10.0000 mg | ORAL_TABLET | Freq: Every day | ORAL | Status: DC
  Administered 2017-11-19: 10 mg via ORAL
  Filled 2017-11-19: qty 1

## 2017-11-19 MED ORDER — FERROUS SULFATE 325 (65 FE) MG PO TABS: 325.0000 mg | ORAL_TABLET | Freq: Two times a day (BID) | ORAL | Status: DC

## 2017-11-19 MED ORDER — AZELASTINE HCL 0.1 % NA SOLN
1.0000 | Freq: Two times a day (BID) | NASAL | Status: DC
  Administered 2017-11-19: 1 via NASAL
  Filled 2017-11-19: qty 30

## 2017-11-19 MED ORDER — VITAMIN D 1000 UNITS PO TABS
1000.0000 [IU] | ORAL_TABLET | Freq: Every day | ORAL | Status: DC
  Administered 2017-11-19: 1000 [IU] via ORAL
  Filled 2017-11-19: qty 1

## 2017-11-19 MED ORDER — LORAZEPAM 0.5 MG PO TABS: 0.5000 mg | ORAL_TABLET | Freq: Two times a day (BID) | ORAL | Status: DC | PRN

## 2017-11-19 MED ORDER — SERTRALINE HCL 50 MG PO TABS
50.0000 mg | ORAL_TABLET | Freq: Every day | ORAL | Status: DC
  Administered 2017-11-19: 50 mg via ORAL
  Filled 2017-11-19: qty 1

## 2017-11-19 MED ORDER — ASPIRIN 81 MG PO CHEW: 81.0000 mg | CHEWABLE_TABLET | Freq: Every evening | ORAL | Status: DC

## 2017-11-19 MED ORDER — VITAMIN B-12 1000 MCG PO TABS
500.0000 ug | ORAL_TABLET | Freq: Every day | ORAL | Status: DC
  Administered 2017-11-19: 500 ug via ORAL
  Filled 2017-11-19: qty 1

## 2017-11-19 MED ORDER — MONTELUKAST SODIUM 10 MG PO TABS: 10.0000 mg | ORAL_TABLET | Freq: Every evening | ORAL | Status: DC

## 2017-11-19 MED ORDER — POLYETHYLENE GLYCOL 3350 17 G PO PACK
17.0000 g | PACK | Freq: Every day | ORAL | Status: DC
  Administered 2017-11-19: 17 g via ORAL
  Filled 2017-11-19: qty 1

## 2017-11-19 MED ORDER — FISH OIL 1200 MG PO CAPS: 1200.0000 mg | ORAL_CAPSULE | Freq: Every day | ORAL | Status: DC

## 2017-11-19 MED ORDER — POTASSIUM CHLORIDE CRYS ER 20 MEQ PO TBCR: 40.0000 meq | EXTENDED_RELEASE_TABLET | Freq: Two times a day (BID) | ORAL | 0 refills | Status: DC

## 2017-11-19 MED ORDER — TRAMADOL HCL 50 MG PO TABS: 50.0000 mg | ORAL_TABLET | Freq: Four times a day (QID) | ORAL | Status: DC | PRN

## 2017-11-19 MED ORDER — POLYVINYL ALCOHOL 1.4 % OP SOLN
2.0000 [drp] | Freq: Three times a day (TID) | OPHTHALMIC | Status: DC | PRN
  Administered 2017-11-19: 2 [drp] via OPHTHALMIC
  Filled 2017-11-19: qty 15

## 2017-11-19 MED ORDER — OMEGA-3-ACID ETHYL ESTERS 1 G PO CAPS
1.0000 g | ORAL_CAPSULE | Freq: Every day | ORAL | Status: DC
  Administered 2017-11-19: 1 g via ORAL
  Filled 2017-11-19: qty 1

## 2017-11-19 MED ORDER — BENAZEPRIL HCL 10 MG PO TABS
10.0000 mg | ORAL_TABLET | Freq: Every day | ORAL | Status: DC
  Administered 2017-11-19: 10 mg via ORAL
  Filled 2017-11-19: qty 1

## 2017-11-19 MED ORDER — CARBOXYMETHYLCELLUL-GLYCERIN 0.5-0.9 % OP SOLN: 2.0000 [drp] | Freq: Three times a day (TID) | OPHTHALMIC | Status: DC | PRN

## 2017-11-19 NOTE — Discharge Summary (Addendum)
Physician Discharge Summary  [name] [name] ZSW:[id number] DOB: 02-12-1941 DOA: 11/18/2017  PCP: Laurey Morale, MD  Admit date: 11/18/2017 Discharge date: 11/19/2017  Admitted From: Home Disposition: Home  Recommendations for Outpatient Follow-up:  1. Follow up with PCP in 1-2 days 2. Please call your medical equipment company come out and check your CPAP machine to make sure it is working properly 3. Ask the medical equipment company to reeducate you about how to keep the machine clean and use the tubing properly.  Home Health: No Equipment/Devices: CPAP  Discharge Condition: Improved CODE STATUS: Full code Diet recommendation: Heart Healthy   Brief/Interim Summary: [name] [name] is a 77 y.o. male with medical history significant of Hyperparathyroidism, HTN, HLD.  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  Patient presents to the ED with generalized weakness, poor PO intake for past 2-3 days.  Symptoms are persistent.  Not really having much neck pain he states, just doesn't feel well.   She was placed in overnight observation due to patient and family request.  Morning he is feeling significantly better.  Both the nurse and I had extensive discussions with patient regarding proper use of his CPAP machine.  I feel that with the recent surgery and not using his CPAP machine he may be feeling weak and tired because he is not properly oxygenated at night.  Requested that he contact his durable medical equipment company to ensure that the CPAP machine is working properly and to get reeducated on proper use and cleaning of the machine.  Personally reviewed his EKG which shows a sinus rhythm with right bundle branch block and left anterior fascicular block.  Was mildly hypokalemic and this was repleted at discharge.   Patient has reached maximal benefit of hospitalization.  Discharge diagnosis, prognosis, plans, follow-up, medications and treatments  discussed with the patient(or responsible party) and is in agreement with the plans as described.  Patient is stable for discharge.  Discharge Diagnoses:  Principal Problem:   Generalized weakness Active Problems:   Hyperparathyroidism, primary [facility])   Essential hypertension    Discharge Instructions  Discharge Instructions    Diet - low sodium heart healthy   Complete by:  As directed    Discharge instructions   Complete by:  As directed    Speak to medical equipment company about ensuring that you have enough supplies for your CPAP machine.  Ask them to reeducate you about how to clean it properly.  Follow their directions about cleaning the CPAP machine eating and machine.  Use your CPAP machine every night as directed. Make and keep a follow-up appointment with Dr. Sarajane Jews   Increase activity slowly   Complete by:  As directed      Allergies as of 11/19/2017   No Known Allergies     Medication List    TAKE these medications   acetaminophen 500 MG tablet Commonly known as:  TYLENOL Take 500 mg by mouth daily as needed for moderate pain or headache.   alendronate 70 MG tablet Commonly known as:  FOSAMAX TAKE 1 TABLET BY MOUTH EVERY 7 DAYS. TAKE WITH FULL GLASS OF WATER ON AN EMPTY STOMACH What changed:    how much to take  how to take this  when to take this  additional instructions   ALLERGY EYE DROPS OP Place 1 drop into both eyes daily.   amLODipine 10 MG tablet Commonly known as:  NORVASC TAKE 1 TABLET (10 MG TOTAL)  BY MOUTH DAILY. What changed:    how much to take  how to take this  when to take this  additional instructions   ASPERCREME LIDOCAINE EX Apply 1 application topically daily as needed (muscle pain).   aspirin 81 MG tablet Take 81 mg by mouth every evening.   azelastine 0.1 % nasal spray Commonly known as:  ASTELIN Place 1 spray into both nostrils 2 (two) times daily.   benazepril 10 MG tablet Commonly known as:  LOTENSIN Take  1 tablet (10 mg total) by mouth daily.   BIOTENE DRY MOUTH MT Use as directed 1 Dose in the mouth or throat daily as needed (dry mouth).   cetirizine 10 MG tablet Commonly known as:  ZYRTEC Take 10 mg by mouth daily.   cholecalciferol 1000 units tablet Commonly known as:  VITAMIN D Take 1 tablet (1,000 Units total) by mouth daily.   EPINEPHrine 0.3 mg/0.3 mL Soaj injection Commonly known as:  EPI-PEN Inject 0.3 mg into the muscle as needed (for allergic reaction).   ferrous sulfate 325 (65 FE) MG tablet TAKE 1 TABLET BY MOUTH 2 TIMES DAILY WITH A MEAL. What changed:  See the new instructions.   Fish Oil 1200 MG Caps Take 1,200 mg by mouth daily.   LORazepam 0.5 MG tablet Commonly known as:  ATIVAN Take 1 tablet (0.5 mg total) by mouth 2 (two) times daily as needed for anxiety. What changed:  when to take this   LUBRICATING EYE DROPS OP Place 1 drop into both eyes 2 (two) times daily.   metoprolol tartrate 50 MG tablet Commonly known as:  LOPRESSOR Take 1 tablet (50 mg total) by mouth 2 (two) times daily.   montelukast 10 MG tablet Commonly known as:  SINGULAIR Take 1 tablet (10 mg total) by mouth every evening.   multivitamin tablet Take 1 tablet by mouth every evening.   omeprazole 20 MG capsule Commonly known as:  PRILOSEC Take 1 capsule (20 mg total) by mouth daily.   polyethylene glycol packet Commonly known as:  MIRALAX / GLYCOLAX Take 1 teaspoonful once daily   potassium chloride SA 20 MEQ tablet Commonly known as:  K-DUR,KLOR-CON Take 2 tablets (40 mEq total) by mouth 2 (two) times daily.   sertraline 50 MG tablet Commonly known as:  ZOLOFT Take 1 tablet (50 mg total) by mouth daily.   simvastatin 20 MG tablet Commonly known as:  ZOCOR TAKE 1 TABLET (20 MG TOTAL) BY MOUTH DAILY. What changed:    how much to take  how to take this  when to take this  additional instructions   solifenacin 5 MG tablet Commonly known as:  VESICARE Take 1  tablet (5 mg total) by mouth daily.   traMADol 50 MG tablet Commonly known as:  ULTRAM Take 1-2 tablets (50-100 mg total) by mouth every 6 (six) hours as needed for moderate pain.   vitamin B-12 500 MCG tablet Commonly known as:  CYANOCOBALAMIN Take 500 mcg by mouth daily.      Follow-up Information    Laurey Morale, MD In 2 days.   Specialty:  Family Medicine Contact information: [place] [address] [phone]          No Known Allergies   Procedures/Studies: Dg Chest 2 View  Result Date: 11/18/2017 CLINICAL DATA:  Malaise and generalized weakness for 2 days. EXAM: CHEST - 2 VIEW COMPARISON:  PA and lateral chest 06/03/2014 and 11/09/2017. FINDINGS: The lungs are clear.  Heart size is normal. No pneumothorax or pleural fluid. No acute or focal bony abnormality. IMPRESSION: Negative chest. Electronically Signed   By: Inge Rise M.D.   On: 11/18/2017 17:50   Dg Chest 2 View  Result Date: 11/09/2017 CLINICAL DATA:  Preoperative chest x-ray for prior to thyroid surgery. EXAM: CHEST - 2 VIEW COMPARISON:  June 03, 2014 FINDINGS: The heart size and mediastinal contours are stable. The aorta is tortuous. Both lungs are clear. The visualized skeletal structures are unremarkable. IMPRESSION: No active cardiopulmonary disease. Electronically Signed   By: Abelardo Diesel M.D.   On: 11/09/2017 14:25   US Thyroid  Result Date: 10/31/2017 CLINICAL DATA:  Primary hyperparathyroidism, history of inferior left parathyroid adenoma on scintigraphy EXAM: THYROID ULTRASOUND TECHNIQUE: Ultrasound examination of the thyroid gland and adjacent soft tissues was performed. COMPARISON:  Scintigraphy 06/17/2011 FINDINGS: Parenchymal Echotexture: Mildly heterogenous Isthmus: 1.5 cm thickness Right lobe: 4.2 x 2.4 x 1.5 cm Left lobe: 4.9 x 2.2 x 2.1 cm _________________________________________________________ Estimated total number of nodules >/= 1 cm: 1 Number of spongiform  nodules >/=  2 cm not described below (TR1): 0 Number of mixed cystic and solid nodules >/= 1.5 cm not described below (TR2): 0 _________________________________________________________ 0.8 x 0.6 cm benign colloid cyst, inferior left 2.7 x 1.2 x 1.1 cm hypoechoic solid nodule without calcifications, posterior to the inferior pole left lobe IMPRESSION: 1. Posterior inferior left 2.7 cm nodule corresponding to focus of abnormal activity on parathyroid scintigraphy. 2. No worrisome thyroid lesions. The above is in keeping with the ACR TI-RADS recommendations - J Am Coll Radiol 2017;14:587-595. Electronically Signed   By: Lucrezia Europe M.D.   On: 10/31/2017 15:04     Subjective: Patient improved overnight subjectively feeling much better.   Discharge Exam: Vitals:   11/19/17 0606 11/19/17 1100  BP: (!) 164/90 (!) 182/95  Pulse: 80 70  Resp: 18 16  Temp: 98.5 F (36.9 C) 97.9 F (36.6 C)  SpO2: 99% 97%   Vitals:   11/18/17 2300 11/19/17 0002 11/19/17 0606 11/19/17 1100  BP: (!) 182/92 (!) 187/88 (!) 164/90 (!) 182/95  Pulse: 72 77 80 70  Resp: 16 16 18 16   Temp:  98 F (36.7 C) 98.5 F (36.9 C) 97.9 F (36.6 C)  TempSrc:  Oral Oral Oral  SpO2: 100% 98% 99% 97%  Weight:      Height:        General: Pt is alert, awake, not in acute distress Cardiovascular: RRR, S1/S2 +, no rubs, no gallops Respiratory: CTA bilaterally, no wheezing, no rhonchi Abdominal: Soft, NT, ND, bowel sounds + Extremities: no edema, no cyanosis    The results of significant diagnostics from this hospitalization (including imaging, microbiology, ancillary and laboratory) are listed below for reference.     Microbiology: No results found for this or any previous visit (from the past 240 hour(s)).   Labs: BNP (last 3 results) No results for input(s): BNP in the last 8760 hours. Basic Metabolic Panel: Recent Labs  Lab 11/18/17 1650 11/19/17 0029 11/19/17 0504  NA 141  --  140  K 3.5  --  3.3*  CL 106   --  102  CO2 20*  --  24  GLUCOSE 115*  --  130*  BUN 16  --  12  CREATININE 1.59*  --  1.21  CALCIUM 9.8  --  9.5  PHOS  --  3.0  --    Liver Function Tests: Recent Labs  Lab  11/18/17 1650  AST 30  ALT 29  ALKPHOS 57  BILITOT 1.3*  PROT 7.0  ALBUMIN 3.7   No results for input(s): LIPASE, AMYLASE in the last 168 hours. No results for input(s): AMMONIA in the last 168 hours. CBC: Recent Labs  Lab 11/18/17 1650  WBC 7.7  NEUTROABS 5.2  HGB 13.1  HCT 39.7  MCV 89.8  PLT 325   Cardiac Enzymes: No results for input(s): CKTOTAL, CKMB, CKMBINDEX, TROPONINI in the last 168 hours. BNP: Invalid input(s): POCBNP CBG: Recent Labs  Lab 11/18/17 1717  GLUCAP 116*   D-Dimer No results for input(s): DDIMER in the last 72 hours. Hgb A1c No results for input(s): HGBA1C in the last 72 hours. Lipid Profile No results for input(s): CHOL, HDL, LDLCALC, TRIG, CHOLHDL, LDLDIRECT in the last 72 hours. Thyroid function studies Recent Labs    11/18/17 1650  TSH 0.377   Anemia work up No results for input(s): VITAMINB12, FOLATE, FERRITIN, TIBC, IRON, RETICCTPCT in the last 72 hours. Urinalysis    Component Value Date/Time   COLORURINE YELLOW 11/18/2017 1650   APPEARANCEUR CLEAR 11/18/2017 1650   LABSPEC 1.008 11/18/2017 1650   PHURINE 9.0 (H) 11/18/2017 1650   GLUCOSEU NEGATIVE 11/18/2017 1650   HGBUR NEGATIVE 11/18/2017 1650   HGBUR negative 12/09/2009 0911   BILIRUBINUR NEGATIVE 11/18/2017 1650   BILIRUBINUR n 03/02/2017 1242   KETONESUR 5 (A) 11/18/2017 1650   PROTEINUR NEGATIVE 11/18/2017 1650   UROBILINOGEN 0.2 03/02/2017 1242   UROBILINOGEN 0.2 12/13/2014 1027   NITRITE NEGATIVE 11/18/2017 1650   LEUKOCYTESUR NEGATIVE 11/18/2017 1650   Sepsis Labs Invalid input(s): PROCALCITONIN,  WBC,  LACTICIDVEN Microbiology No results found for this or any previous visit (from the past 240 hour(s)).   Time coordinating discharge: 40 minutes  SIGNED:   Lady Deutscher, MD  FACP [facility] 11/19/2017, 12:15 PM Pager   If 7PM-7AM, please contact night-coverage [email or web address] Password [email or web address]

## 2017-11-19 NOTE — Progress Notes (Signed)
Patient is alert, sitting up eating breakfast. He asks when he can go home. Patient is encouraged to wait to see a provider who can re-evaluate him and together they can decide if he is ready to discharge.

## 2017-11-19 NOTE — Progress Notes (Signed)
D/c reviewed with patient and spouse. No further questions at this time 

## 2017-11-20 DIAGNOSIS — Z743 Need for continuous supervision: Secondary | ICD-10-CM | POA: Diagnosis not present

## 2017-11-20 DIAGNOSIS — R404 Transient alteration of awareness: Secondary | ICD-10-CM | POA: Diagnosis not present

## 2017-11-20 LAB — PARATHYROID HORMONE, INTACT (NO CA): PTH: 9 pg/mL — ABNORMAL LOW (ref 15–65)

## 2017-11-21 ENCOUNTER — Telehealth: Payer: Self-pay | Admitting: Family Medicine

## 2017-11-21 LAB — VITAMIN D 25 HYDROXY (VIT D DEFICIENCY, FRACTURES): VIT D 25 HYDROXY: 26.2 ng/mL — AB (ref 30.0–100.0)

## 2017-11-21 NOTE — Telephone Encounter (Signed)
Called to do TCM - hospital follow up on the pt and was informed by his wife that Mr. Kresse took his life last night.  Will forward to PCP as FYI.

## 2017-11-22 ENCOUNTER — Ambulatory Visit: Payer: Medicare Other | Admitting: Sports Medicine

## 2017-12-09 ENCOUNTER — Other Ambulatory Visit: Payer: Self-pay

## 2017-12-09 NOTE — Patient Outreach (Signed)
[facility]) Care Management  12/09/2017  [name] [name] Worthing February 25, 1940 [id number]   Medication Adherence call to Mr. Jake Scale patient Deceased a couple of weeks ago per patient's family member patient is showing past due on Simvastatin 20 mg under [place].   [place] Management Direct Dial [phone]  Fax [phone] Addam [email or web address]

## 2017-12-15 ENCOUNTER — Telehealth: Payer: Self-pay | Admitting: Family Medicine

## 2017-12-15 NOTE — Telephone Encounter (Signed)
Copied from [place] [phone]. Topic: Quick Communication - See Telephone Encounter >> Dec 15, 2017  3:30 PM [name] A wrote: CRM for notification. See Telephone encounter for: 12/15/17.Patient's Wife Peter Congo Berthold is calling to speak to Dr. Sharlene Motts regarding husband's passing on 12-18-17.  According to Ms. Brousseau the patient took his life after being hosptalized on 11/18/17. He was discharged on 11/19/17. And should not have.  The patient took his life on 12-18-17 while the wife was at church. Patient was scheduled for Thyroid surgery on 12/14/17. Patient wife said that she previously called Dr. Sarajane Jews but she has not heard anything. Please advise Patients wife Ace Gins Call Back number [phone]

## 2017-12-15 NOTE — Telephone Encounter (Unsigned)
Copied from [place] [phone]. Topic: General - Deceased Patient >> 01-03-2018  3:13 PM [name] A wrote: Reason for CRM: Patient's wife, [name] [name], reported the patient is deceased. Patient died on Dec 09, 2017.  Thanks  Route to department's PEC Pool.

## 2017-12-15 NOTE — Telephone Encounter (Unsigned)
Copied from [place] [phone]. Topic: General - Deceased Patient >> January 09, 2018  3:13 PM [name] A wrote: Reason for CRM: Patient's wife, Dontel Harshberger, reported the patient is deceased. Patient died on 2017/12/15.  Thanks  Route to department's PEC Pool. >> January 09, 2018  3:19 PM [name] A wrote: Routed to wrong practice

## 2017-12-16 DIAGNOSIS — 419620001 Death: Secondary | SNOMED CT | POA: Diagnosis not present

## 2017-12-16 DEATH — deceased

## 2017-12-21 NOTE — Telephone Encounter (Signed)
I spoke to the patient's wife Peter Congo, and she is requesting copies of his medical records. Please give her a call to discuss this

## 2017-12-21 NOTE — Telephone Encounter (Signed)
Spoken with pt

## 2018-09-12 ENCOUNTER — Ambulatory Visit: Payer: Medicare Other

## 2019-07-17 IMAGING — CR DG CHEST 2V
2 series · 2 of 2 positions shown · non-contrast
Comparison: June 03, 2014

CLINICAL DATA: Preoperative chest x-ray for prior to thyroid
surgery.

EXAM:
CHEST - 2 VIEW

[w chest pa]
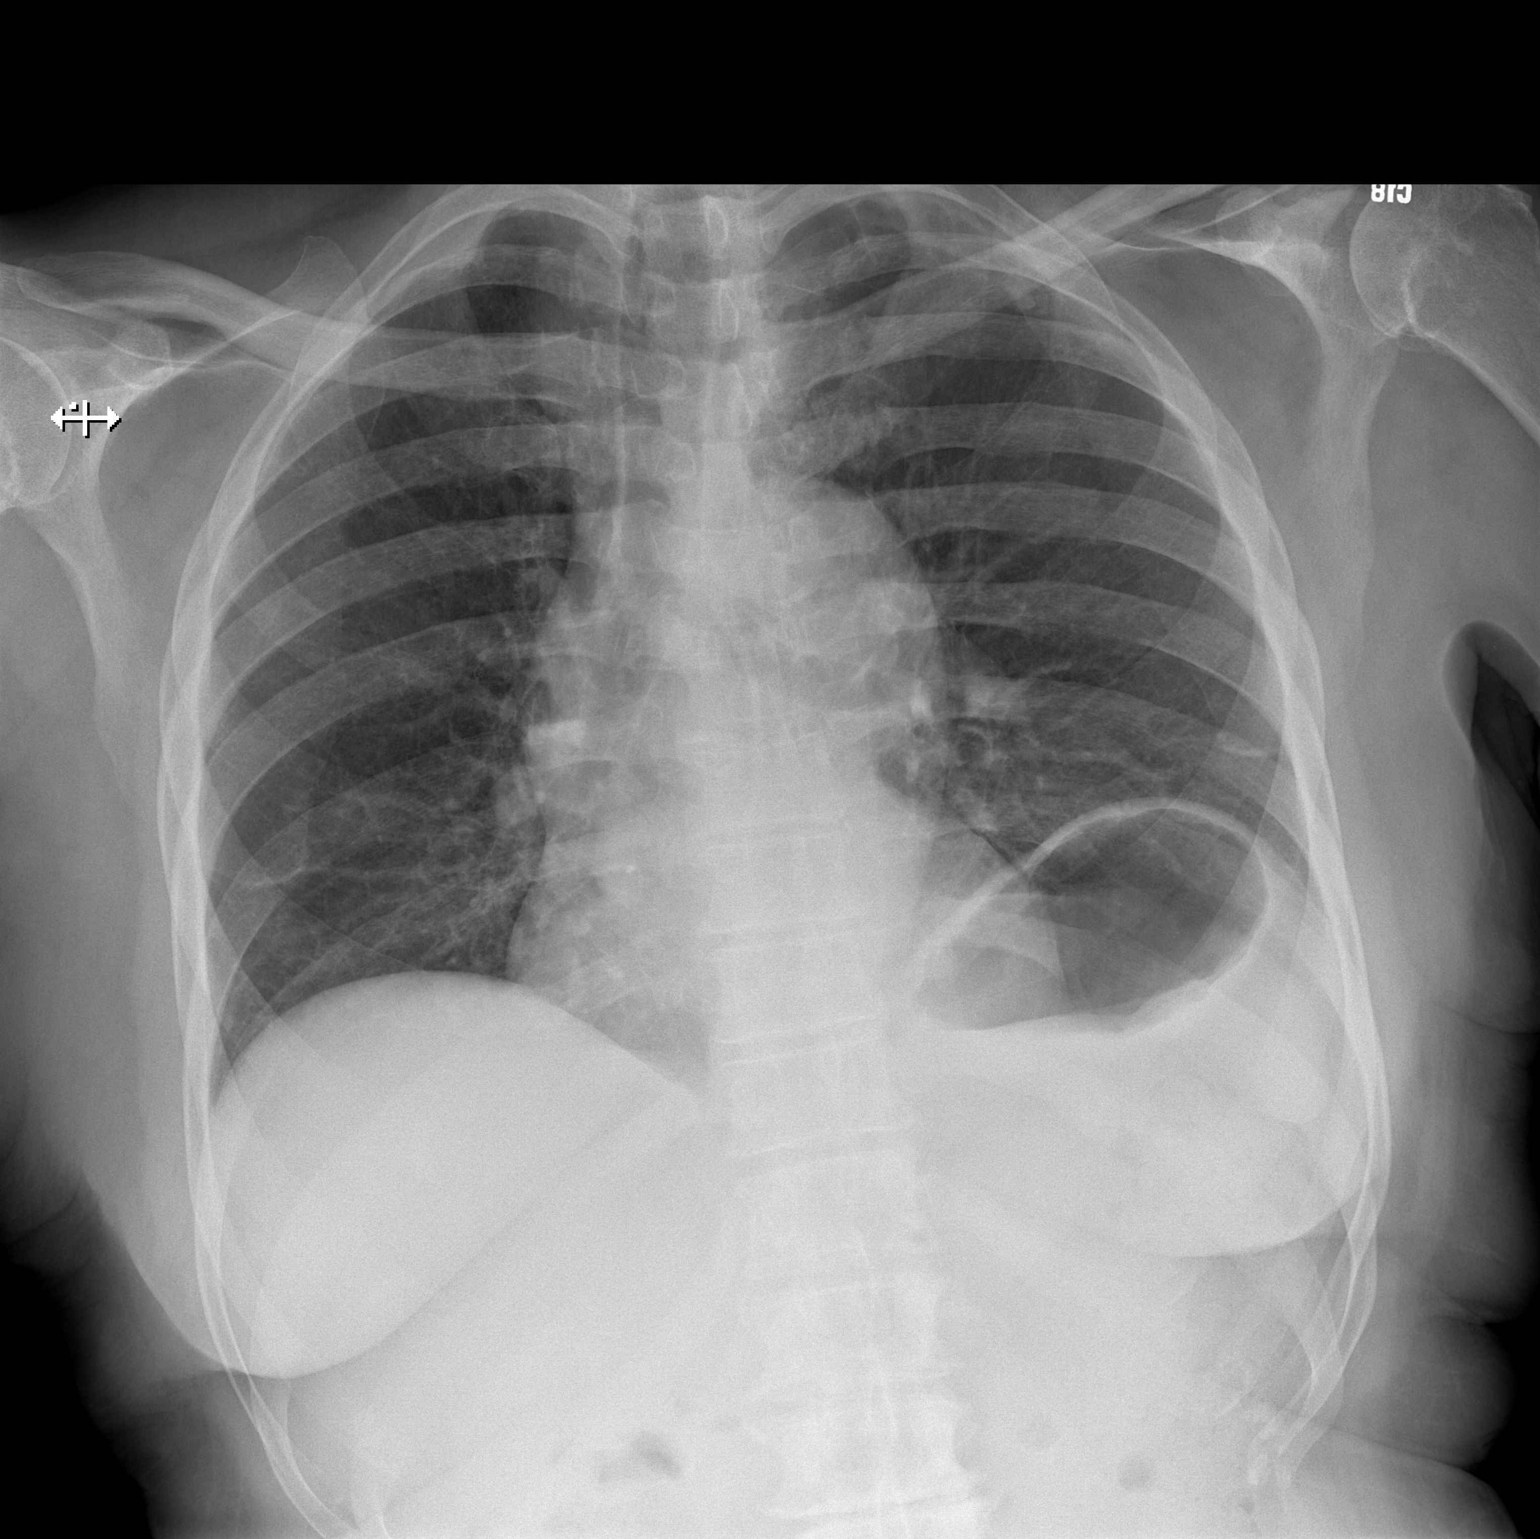

[w chest lat]
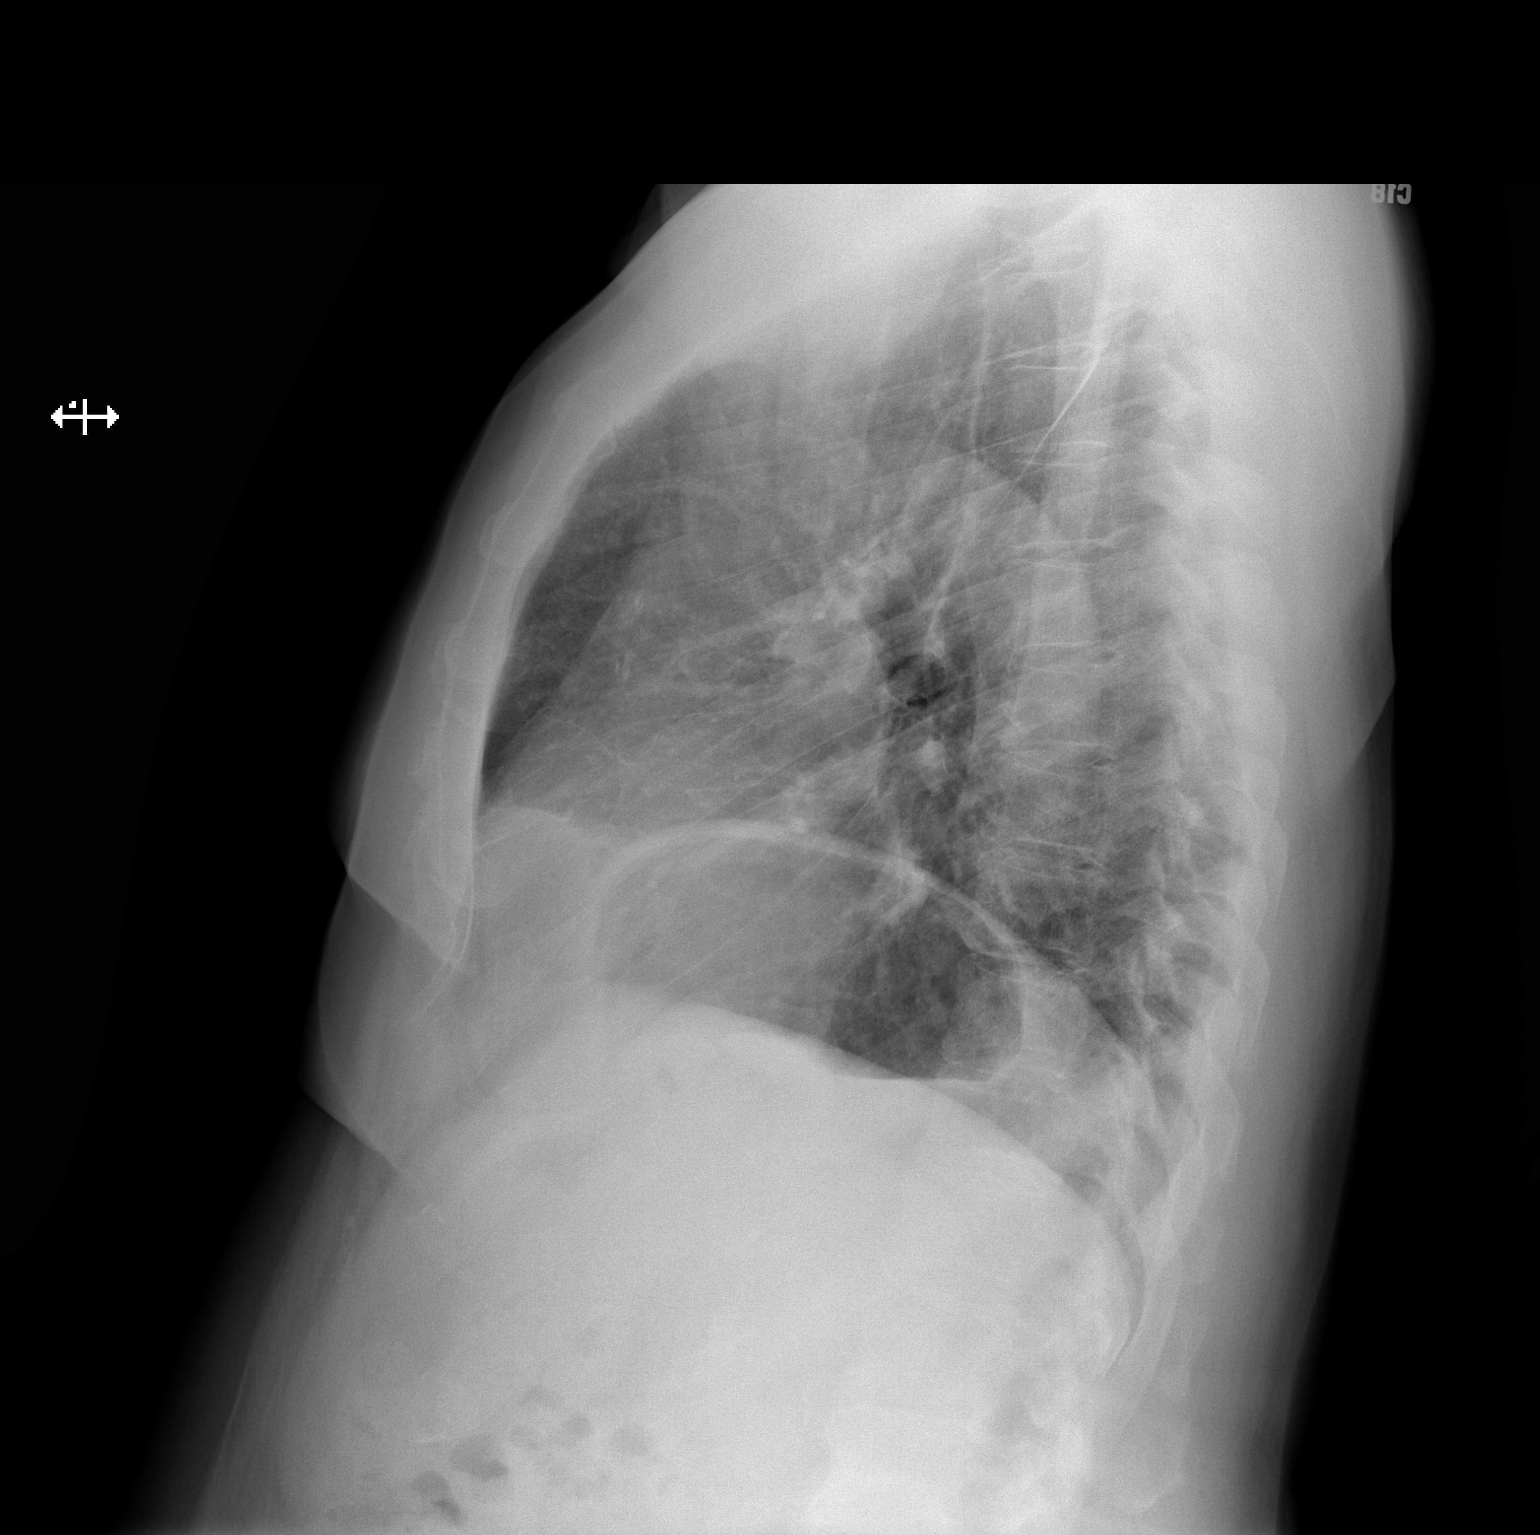

[2 of 2 positions shown; findings below may reference images not displayed]

FINDINGS: The heart size and mediastinal contours are stable. The aorta is
tortuous. Both lungs are clear. The visualized skeletal structures
are unremarkable.
IMPRESSION: No active cardiopulmonary disease.

## 2020-01-08 IMAGING — US US THYROID
1 series · 14 of 25 positions shown · non-contrast
Comparison: Scintigraphy 06/17/2011

CLINICAL DATA: Primary hyperparathyroidism, history of inferior
left parathyroid adenoma on scintigraphy

EXAM:
THYROID ULTRASOUND
TECHNIQUE: Ultrasound examination of the thyroid gland and adjacent soft
tissues was performed.

[Series 1: us thyroid · 0.08mm/px · 14 of 55 slices shown]
[im 1/55]
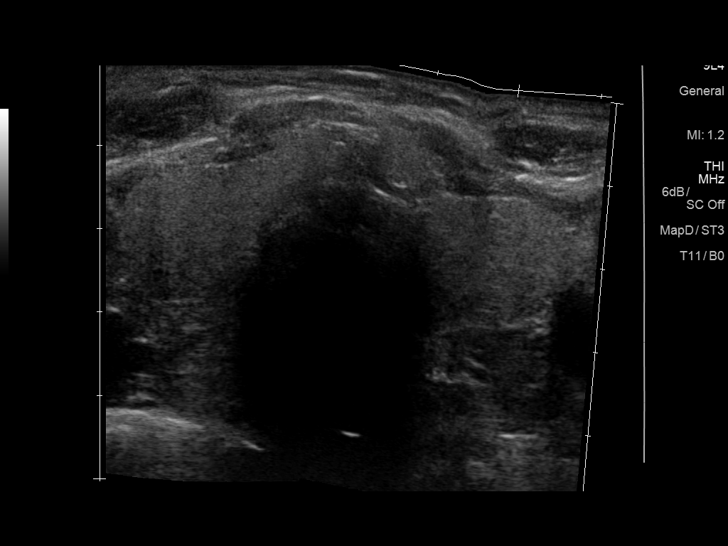
[im 5/55]
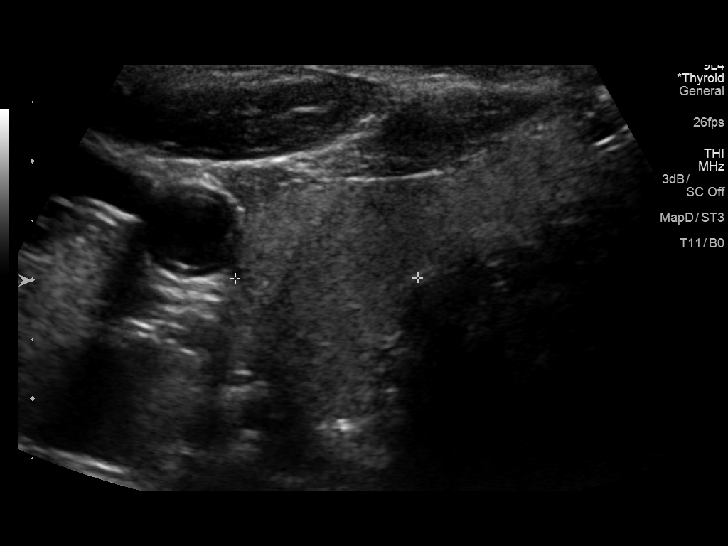
[im 10/55]
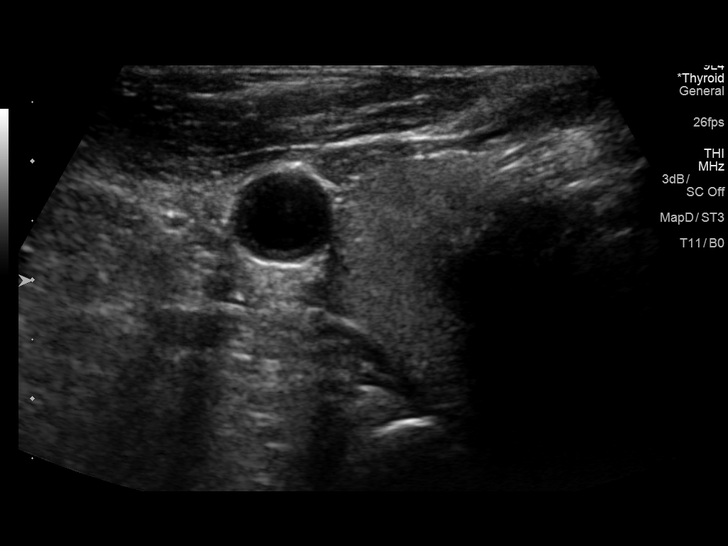
[im 14/55]
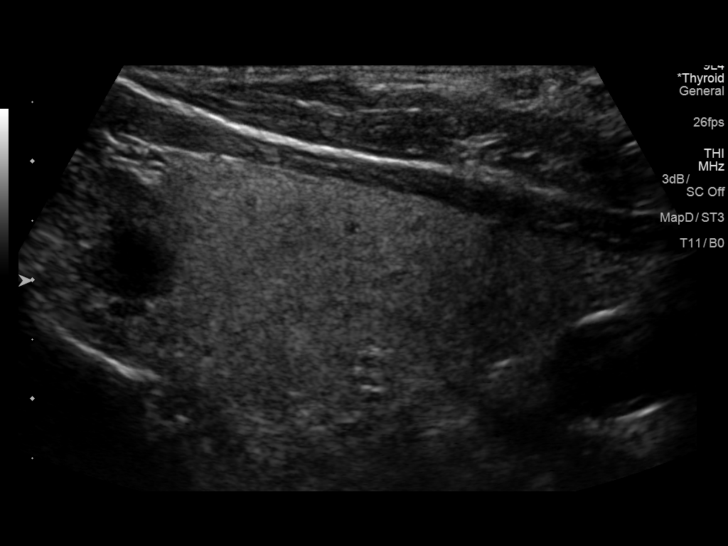
[im 19/55]
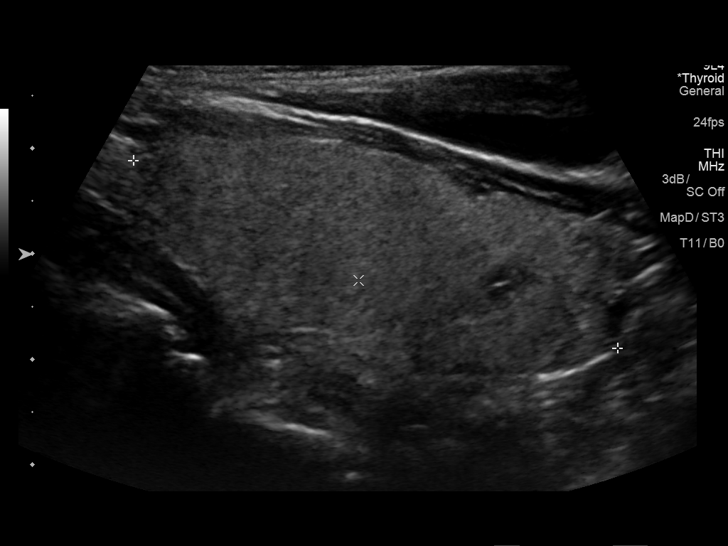
[im 21/55]
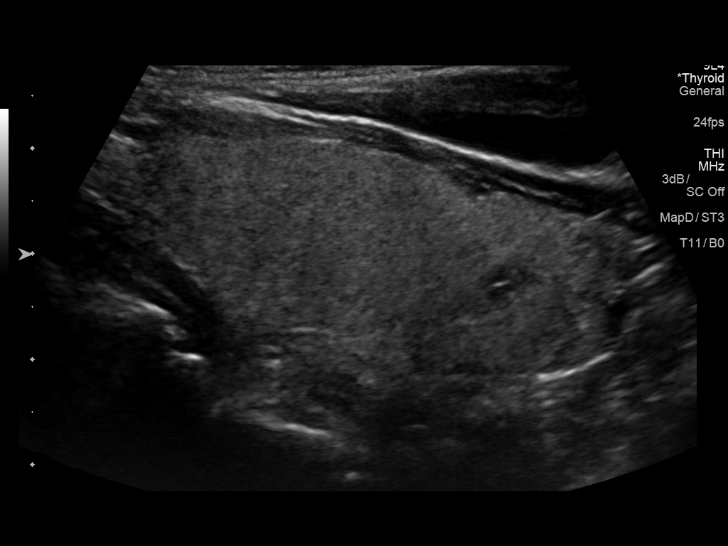
[im 25/55]
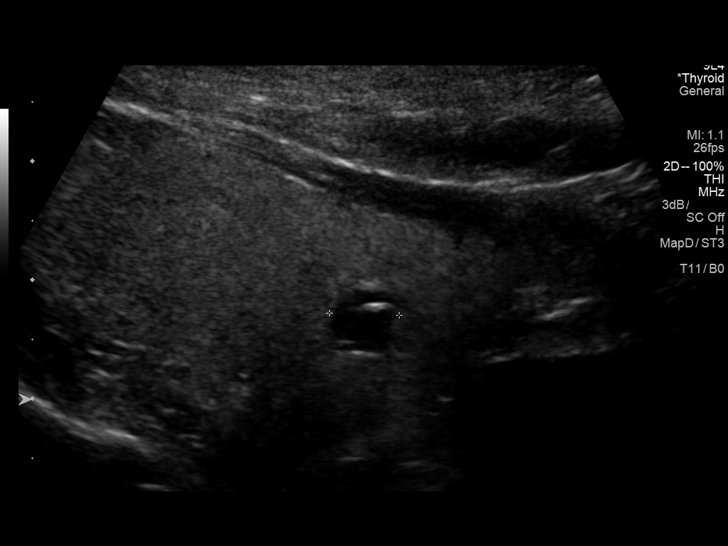
[im 30/55]
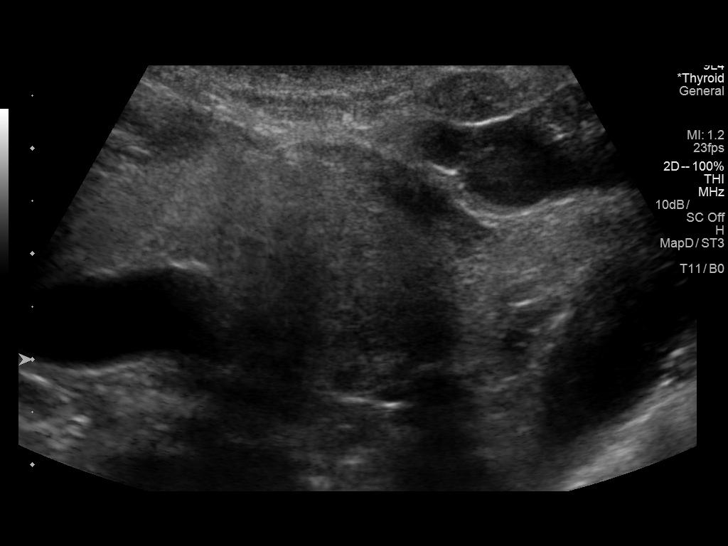
[im 34/55]
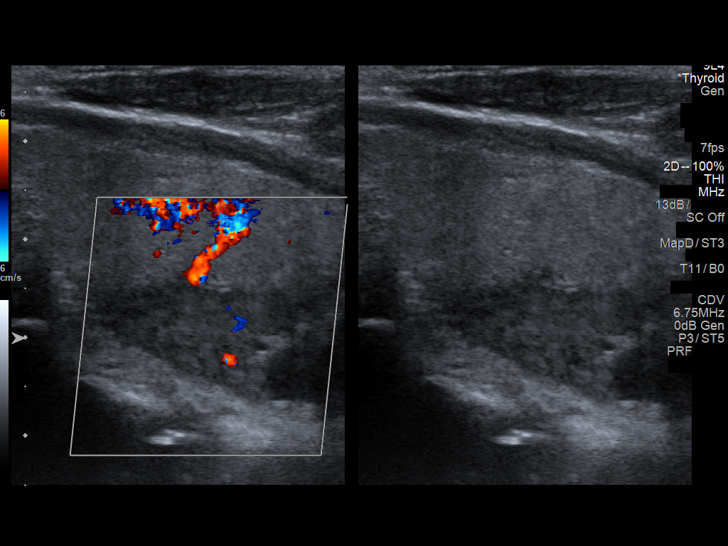
[im 37/55]
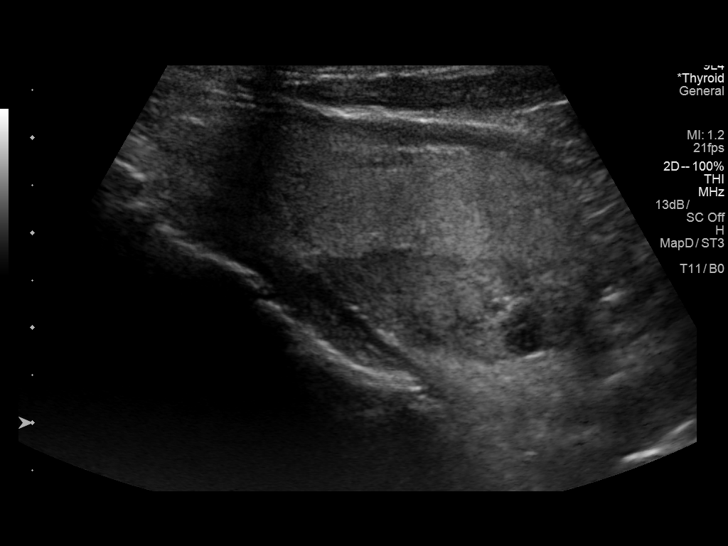
[im 41/55]
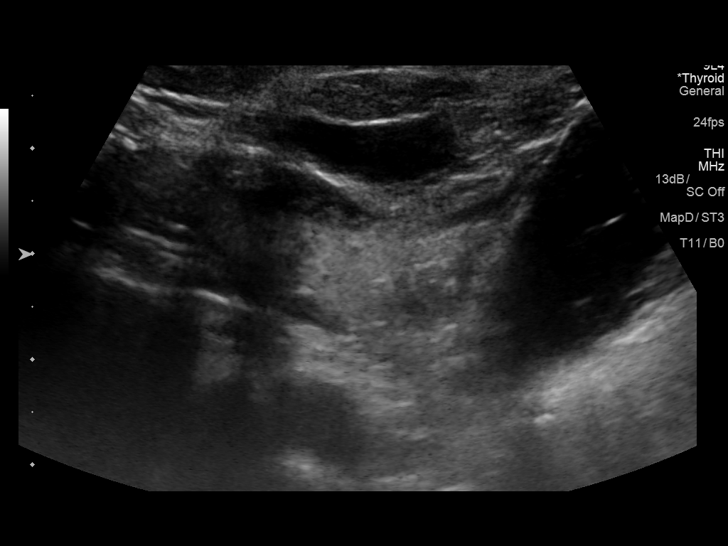
[im 46/55]
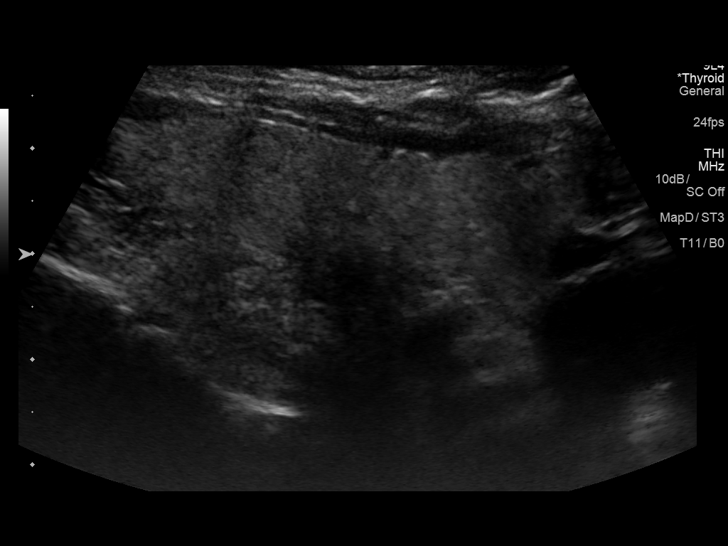
[im 50/55]
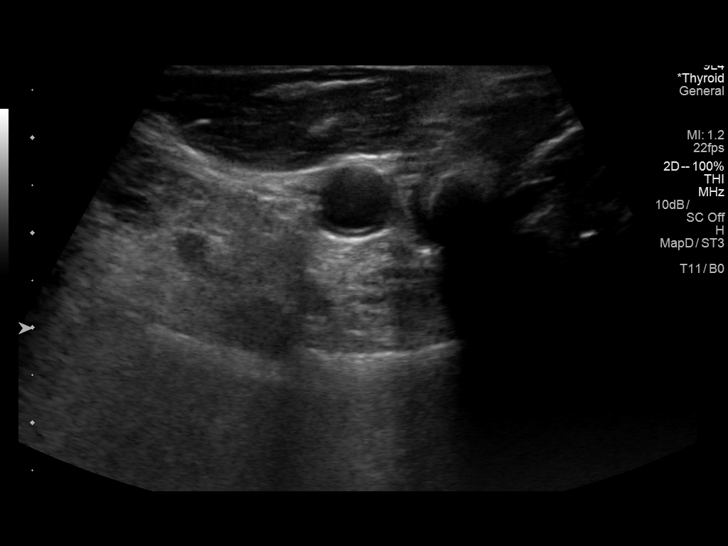
[im 55/55]
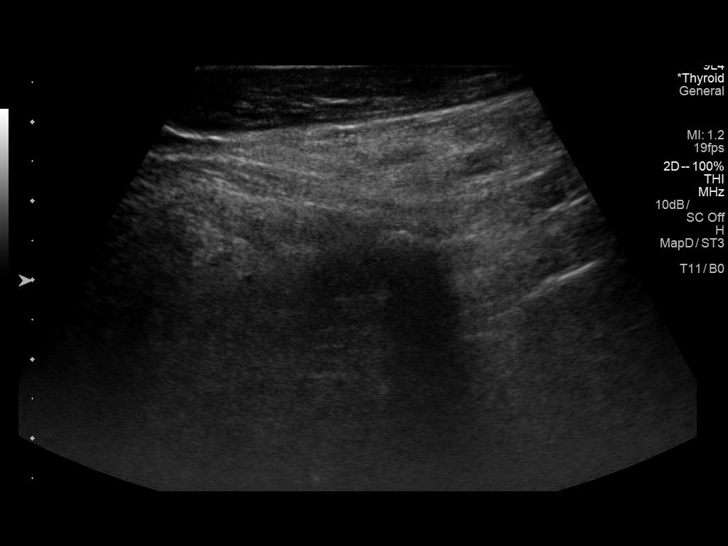

[14 of 25 positions shown; findings below may reference images not displayed]

FINDINGS: Parenchymal Echotexture: Mildly heterogenous

Isthmus: 1.5 cm thickness

Right lobe: 4.2 x 2.4 x 1.5 cm

Left lobe: 4.9 x 2.2 x 2.1 cm

_________________________________________________________

Estimated total number of nodules >/= 1 cm: 1

Number of spongiform nodules >/=  2 cm not described below (TR1): 0

Number of mixed cystic and solid nodules >/= 1.5 cm not described
below (TR2): 0

_________________________________________________________

0.8 x 0.6 cm benign colloid cyst, inferior left

2.7 x 1.2 x 1.1 cm hypoechoic solid nodule without calcifications,
posterior to the inferior pole left lobe
IMPRESSION: 1. Posterior inferior left 2.7 cm nodule corresponding to focus of
abnormal activity on parathyroid scintigraphy.
2. No worrisome thyroid lesions.

The above is in keeping with the ACR TI-RADS recommendations - [HOSPITAL] 0603;[DATE].
